# Patient Record
Sex: Female | Born: 1950 | Race: White | Hispanic: No | Marital: Married
Health system: Midwestern US, Community
[De-identification: ages and names within clinical notes are randomized; demographics above are authoritative.]

## PROBLEM LIST (undated history)

## (undated) DIAGNOSIS — M1990 Unspecified osteoarthritis, unspecified site: Secondary | ICD-10-CM

## (undated) DIAGNOSIS — I2510 Atherosclerotic heart disease of native coronary artery without angina pectoris: Secondary | ICD-10-CM

## (undated) DIAGNOSIS — Z8619 Personal history of other infectious and parasitic diseases: Secondary | ICD-10-CM

## (undated) DIAGNOSIS — E785 Hyperlipidemia, unspecified: Secondary | ICD-10-CM

## (undated) DIAGNOSIS — T7840XA Allergy, unspecified, initial encounter: Secondary | ICD-10-CM

## (undated) DIAGNOSIS — H905 Unspecified sensorineural hearing loss: Secondary | ICD-10-CM

## (undated) DIAGNOSIS — J45909 Unspecified asthma, uncomplicated: Secondary | ICD-10-CM

## (undated) DIAGNOSIS — R7303 Prediabetes: Secondary | ICD-10-CM

## (undated) DIAGNOSIS — R42 Dizziness and giddiness: Secondary | ICD-10-CM

## (undated) DIAGNOSIS — F32A Depression, unspecified: Secondary | ICD-10-CM

## (undated) DIAGNOSIS — K5010 Crohn's disease of large intestine without complications: Secondary | ICD-10-CM

## (undated) DIAGNOSIS — E559 Vitamin D deficiency, unspecified: Secondary | ICD-10-CM

## (undated) DIAGNOSIS — K219 Gastro-esophageal reflux disease without esophagitis: Secondary | ICD-10-CM

## (undated) DIAGNOSIS — H269 Unspecified cataract: Secondary | ICD-10-CM

## (undated) DIAGNOSIS — D490 Neoplasm of unspecified behavior of digestive system: Secondary | ICD-10-CM

## (undated) DIAGNOSIS — M8580 Other specified disorders of bone density and structure, unspecified site: Secondary | ICD-10-CM

## (undated) DIAGNOSIS — K760 Fatty (change of) liver, not elsewhere classified: Secondary | ICD-10-CM

## (undated) DIAGNOSIS — N816 Rectocele: Secondary | ICD-10-CM

## (undated) DIAGNOSIS — J309 Allergic rhinitis, unspecified: Secondary | ICD-10-CM

## (undated) DIAGNOSIS — Z5189 Encounter for other specified aftercare: Secondary | ICD-10-CM

## (undated) DIAGNOSIS — F419 Anxiety disorder, unspecified: Secondary | ICD-10-CM

## (undated) DIAGNOSIS — F329 Major depressive disorder, single episode, unspecified: Secondary | ICD-10-CM

## (undated) DIAGNOSIS — Z1231 Encounter for screening mammogram for malignant neoplasm of breast: Secondary | ICD-10-CM

## (undated) DIAGNOSIS — R10814 Left lower quadrant abdominal tenderness: Secondary | ICD-10-CM

## (undated) DIAGNOSIS — D270 Benign neoplasm of right ovary: Secondary | ICD-10-CM

## (undated) DIAGNOSIS — R928 Other abnormal and inconclusive findings on diagnostic imaging of breast: Secondary | ICD-10-CM

## (undated) DIAGNOSIS — I7090 Unspecified atherosclerosis: Secondary | ICD-10-CM

## (undated) HISTORY — DX: Encounter for other specified aftercare: Z51.89

## (undated) HISTORY — DX: Depression, unspecified: F32.A

## (undated) HISTORY — PX: OOPHORECTOMY: SHX86

## (undated) HISTORY — DX: Dizziness and giddiness: R42

## (undated) HISTORY — DX: Fatty (change of) liver, not elsewhere classified: K76.0

## (undated) HISTORY — DX: Anxiety disorder, unspecified: F41.9

## (undated) HISTORY — DX: Hyperlipidemia, unspecified: E78.5

## (undated) HISTORY — DX: Unspecified cataract: H26.9

## (undated) HISTORY — DX: Allergy, unspecified, initial encounter: T78.40XA

## (undated) HISTORY — DX: Major depressive disorder, single episode, unspecified: F32.9

## (undated) HISTORY — DX: Unspecified osteoarthritis, unspecified site: M19.90

## (undated) HISTORY — DX: Neoplasm of unspecified behavior of digestive system: D49.0

## (undated) HISTORY — DX: Unspecified sensorineural hearing loss: H90.5

## (undated) HISTORY — DX: Gastro-esophageal reflux disease without esophagitis: K21.9

## (undated) HISTORY — PX: TONSILLECTOMY AND ADENOIDECTOMY: SHX28

## (undated) HISTORY — DX: Atherosclerotic heart disease of native coronary artery without angina pectoris: I25.10

## (undated) HISTORY — DX: Other specified disorders of bone density and structure, unspecified site: M85.80

## (undated) HISTORY — PX: UPPER GASTROINTESTINAL ENDOSCOPY: SHX188

## (undated) HISTORY — DX: Allergic rhinitis, unspecified: J30.9

## (undated) HISTORY — DX: Rectocele: N81.6

## (undated) HISTORY — DX: Prediabetes: R73.03

## (undated) HISTORY — DX: Personal history of other infectious and parasitic diseases: Z86.19

## (undated) HISTORY — DX: Unspecified asthma, uncomplicated: J45.909

## (undated) HISTORY — DX: Crohn's disease of large intestine without complications: K50.10

## (undated) HISTORY — PX: ABDOMINAL HYSTERECTOMY: SHX81

## (undated) HISTORY — DX: Vitamin D deficiency, unspecified: E55.9

---

## 2012-05-30 LAB — HM COLONOSCOPY

## 2012-09-19 ENCOUNTER — Encounter

## 2014-03-17 ENCOUNTER — Encounter

## 2014-06-22 ENCOUNTER — Encounter

## 2014-06-26 ENCOUNTER — Ambulatory Visit: Payer: BLUE CROSS/BLUE SHIELD | Primary: Family Medicine

## 2014-06-26 ENCOUNTER — Inpatient Hospital Stay: Admit: 2014-06-26 | Payer: BLUE CROSS/BLUE SHIELD | Attending: Family Medicine | Primary: Family Medicine

## 2014-06-26 DIAGNOSIS — R10814 Left lower quadrant abdominal tenderness: Secondary | ICD-10-CM

## 2014-07-30 ENCOUNTER — Ambulatory Visit
Admit: 2014-07-30 | Discharge: 2014-07-30 | Payer: BLUE CROSS/BLUE SHIELD | Attending: Obstetrics & Gynecology | Primary: Family Medicine

## 2014-07-30 DIAGNOSIS — R1900 Intra-abdominal and pelvic swelling, mass and lump, unspecified site: Secondary | ICD-10-CM

## 2014-07-30 NOTE — Progress Notes (Signed)
HISTORY OF PRESENT ILLNESS  Lindsay Robbins is a 64 y.o. female.  HPI   Patient presents today for pelvic pain and right adnexal mass seen on [place]. US revealed a 9x6cm mostly cystic mass just right of midline. Patient has a hx of TAH/LSO. Has had 1 month of pelvic pain and pressure with urinary frequency. Referred from her PCP. Also complains of known rectocele. Has difficulty with bowel movements and feels a sensation of "bulging and fullness".     Chief Complaint   Patient presents with   ??? Follow-up       Allergies   Allergen Reactions   ??? Codeine Itching     Current Outpatient Prescriptions   Medication Sig Dispense Refill   ??? rosuvastatin (CRESTOR) 10 mg tablet Take 10 mg by mouth nightly.     ??? estradiol (ESTRACE) 1 mg tablet Take 1 mg by mouth daily.     ??? vilazodone (VIIBRYD) 40 mg tab tablet Take  by mouth daily.     ??? LORazepam (ATIVAN) 0.5 mg tablet Take  by mouth.     ??? celecoxib (CELEBREX) 200 mg capsule Take  by mouth two (2) times a day.     ??? cyanocobalamin (VITAMIN B12) 1,000 mcg/mL injection 1,000 mcg by IntraMUSCular route once.     ??? aspirin delayed-release 81 mg tablet Take  by mouth daily.     ??? multivitamin with iron tablet Take 1 Tab by mouth daily.       Past Medical History   Diagnosis Date   ??? Rectocele 07/30/2014   ??? Hypercholesterolemia      Past Surgical History   Procedure Laterality Date   ??? Hx hysterectomy       TAH/LSO   ??? Hx cesarean section       History     Social History   ??? Marital Status: MARRIED     Spouse Name: N/A     Number of Children: N/A   ??? Years of Education: N/A     Occupational History   ??? Not on file.     Social History Main Topics   ??? Smoking status: Never Smoker    ??? Smokeless tobacco: Not on file   ??? Alcohol Use: No   ??? Drug Use: No   ??? Sexual Activity:     Partners: Male     Other Topics Concern   ??? Not on file     Social History Narrative     Family History   Problem Relation Age of Onset   ??? Breast Cancer Neg Hx    ??? Colon Cancer Neg Hx    ??? Ovarian Cancer Neg Hx       BP 120/80 mmHg   Ht 5\' 4"  (1.626 m)   Wt 197 lb (89.359 kg)   BMI 33.80 kg/m2                 Review of Systems   Constitutional: Negative for fever, chills, weight loss and malaise/fatigue.   Respiratory: Negative for cough and shortness of breath.    Cardiovascular: Negative for chest pain and orthopnea.   Gastrointestinal: Positive for abdominal pain. Negative for heartburn, nausea, vomiting, diarrhea, constipation, blood in stool and melena.   Genitourinary: Positive for frequency. Negative for dysuria, urgency, hematuria and flank pain.   Neurological: Negative for headaches.   Psychiatric/Behavioral: Negative for depression. The patient is not nervous/anxious and does not have insomnia.        Physical  Exam   Constitutional: She is oriented to person, place, and time. She appears well-developed and well-nourished. No distress.   HENT:   Head: Normocephalic.   Eyes: Conjunctivae are normal. Pupils are equal, round, and reactive to light.   Neck: Normal range of motion. Neck supple.   Cardiovascular: Normal rate.    Pulmonary/Chest: Effort normal. No respiratory distress.   Abdominal: Soft. She exhibits no distension and no mass. There is no tenderness. There is no rebound and no guarding.   Genitourinary: Vagina normal. Rectal exam shows no external hemorrhoid. No labial fusion. There is no rash, tenderness, lesion or injury on the right labia. There is no rash, tenderness, lesion or injury on the left labia. Right adnexum displays mass, tenderness and fullness. Left adnexum displays no mass, no tenderness and no fullness. No erythema or bleeding in the vagina. No vaginal discharge found.   Moderate stage 1 rectocele noted.    Musculoskeletal: Normal range of motion.   Neurological: She is alert and oriented to person, place, and time.   Skin: Skin is warm and dry.   Psychiatric: She has a normal mood and affect. Her behavior is normal. Judgment and thought content normal.   Vitals reviewed.       ASSESSMENT and PLAN    ICD-10-CM ICD-9-CM    1. Pelvic mass in female R19.00 789.30 CANCER ANTIGEN 125   2. Rectocele N81.6 618.04      Follow-up Disposition:  Return if symptoms worsen or fail to improve, for preop.  Reviewed US findings and discussed mgmt options for pelvic mass and rectocele. Reassured that mass appears benign on [place]. Will obtain CA125. If CA125 normal then will proceed with laparoscopic oophorectomy. She would also like to proceed with rectocele repair. All questions answered.     The risks of surgery were discussed in detail, including anesthesia, hemorrhage, blood clots, infection, damage to other organs such as bowel, bladder, ureters. Also the possible need for catheter use at home after surgery.  Time away from work and physical restrictions discussed.  Pt understands that complications aren't anticipated but that if they should occur then corrective procedures would be performed as indicated including, but not limited to:  transfusion, antibiotics and additional surgical procedures including modification/extension of incision (possible vertical incision), colostomy, reimplantation of ureters, need for prolonged catheter drainage should urologic injury occur & other procedures as indicated.  Unanticipated reactions to anesthesia as well as the small possibility of death were all discussed.  All of the patient's questions were answered.  She was encouraged to call if she has additional questions/concerns prior to surgery.  Patient understands and states she wants to proceed w/ surgery.   More then 50% of this 45 minute visit was spent counseling the patient about mgmt of adnexal mass and rectocele.

## 2014-07-30 NOTE — Progress Notes (Signed)
Name: Lindsay Robbins     DOB: [birth year]  Insurance:    Address:   Home:   Work:    Physicians Information    Recommended Surgery: laparoscopic RSO/rectocele repair  Place: [facility]  When: coordinate with patient  Diagnosis: right adnexal mass, rectocele  Time Needed: 1.5 hrs  Surgeon: Tonye Pearson  Assistant Needed: Tamala Julian  Special Request:    Surgery Department Information    Date Scheduled: _____________________ Hospital Pre-Op: ______________________        Time Scheduled : ____________________ Surgery Entered: _____________________    Facility: ____________________________ Patient Notified: ______________________    HCW Pre-Op: _______________________ Orders Completed: ___________________    Signed by:

## 2014-07-31 LAB — CANCER ANTIGEN 125: Cancer Ag (CA) 125: 12.1 U/mL (ref 0.0–34.0)

## 2014-08-28 ENCOUNTER — Ambulatory Visit
Admit: 2014-08-28 | Discharge: 2014-08-28 | Payer: BLUE CROSS/BLUE SHIELD | Attending: Obstetrics & Gynecology | Primary: Family Medicine

## 2014-08-28 ENCOUNTER — Inpatient Hospital Stay: Admit: 2014-08-28 | Payer: BLUE CROSS/BLUE SHIELD | Primary: Family Medicine

## 2014-08-28 DIAGNOSIS — Z01812 Encounter for preprocedural laboratory examination: Secondary | ICD-10-CM

## 2014-08-28 DIAGNOSIS — Z01818 Encounter for other preprocedural examination: Secondary | ICD-10-CM

## 2014-08-28 LAB — CBC W/O DIFF
HCT: 40.1 % (ref 35.8–46.3)
HGB: 12.9 g/dL (ref 11.7–15.4)
MCH: 29.5 PG (ref 26.1–32.9)
MCHC: 32.2 g/dL (ref 31.4–35.0)
MCV: 91.6 FL (ref 79.6–97.8)
MPV: 12 FL (ref 10.8–14.1)
PLATELET: 229 10*3/uL (ref 150–450)
RBC: 4.38 M/uL (ref 4.05–5.25)
RDW: 12.5 % (ref 11.9–14.6)
WBC: 8.6 10*3/uL (ref 4.3–11.1)

## 2014-08-28 LAB — URINALYSIS W/ RFLX MICROSCOPIC
Bilirubin: NEGATIVE
Blood: NEGATIVE
Glucose: NEGATIVE mg/dL
Ketone: NEGATIVE mg/dL
Leukocyte Esterase: NEGATIVE
Nitrites: NEGATIVE
Protein: NEGATIVE mg/dL
Specific gravity: 1.015 (ref 1.001–1.023)
Urobilinogen: 0.2 EU/dL (ref 0.2–1.0)
pH (UA): 7 (ref 5.0–9.0)

## 2014-08-28 NOTE — Other (Signed)
CBC, U/A; results within [facility] protocols.

## 2014-08-28 NOTE — Other (Signed)
Patient verified name, DOB, and surgery as listed in [facility].    Type 2 surgery, walk in assessment complete.    Labs per surgeon: CBC, U/A; results pending.  Labs per anesthesia protocol: included in surgeons orders.   EKG: not needed per [facility] protocols.     Hibiclens and instructions given per hospital policy.    Patient provided with handouts including Guide to Surgery, Pain Management, Hand Hygiene, Blood Transfusion Education, and Corporate treasurer.    Patient answered medical/surgical history questions at their best of ability. All prior to admission medications documented in [facility].    Patient instructed to hold all vitamins 7 days prior to surgery and NSAIDS 5 days prior to surgery, patient verbalized understanding.    Patient instructed to continue previous medications as prescribed prior to surgery and to take the following medications the day of surgery according to anesthesia guidelines with a small sip of water: proventil inhaler if needed, estradiol, vibryd. Instructed to bring inhaler dos.     Patient taught back and verbalized understanding.

## 2014-08-28 NOTE — Progress Notes (Signed)
HISTORY OF PRESENT ILLNESS  Lindsay Robbins is a 64 y.o. female.  HPI   Lindsay Robbins presents today for pre-operative evaluation.       Female Gyn History: scheduled for laparoscopic RSO and rectocele repair.     Allergies:   Allergies   Allergen Reactions   ??? Codeine Itching       Medication History:  Current Outpatient Prescriptions   Medication Sig Dispense Refill   ??? rosuvastatin (CRESTOR) 10 mg tablet Take 10 mg by mouth nightly.     ??? estradiol (ESTRACE) 1 mg tablet Take 1 mg by mouth daily.     ??? vilazodone (VIIBRYD) 40 mg tab tablet Take  by mouth daily.     ??? LORazepam (ATIVAN) 0.5 mg tablet Take  by mouth.     ??? celecoxib (CELEBREX) 200 mg capsule Take  by mouth two (2) times a day.     ??? cyanocobalamin (VITAMIN B12) 1,000 mcg/mL injection 1,000 mcg by IntraMUSCular route once.     ??? aspirin delayed-release 81 mg tablet Take  by mouth daily.     ??? multivitamin with iron tablet Take 1 Tab by mouth daily.         Past Medical History:  Past Medical History   Diagnosis Date   ??? Rectocele 07/30/2014   ??? Hypercholesterolemia        Past Surgical History:  Past Surgical History   Procedure Laterality Date   ??? Hx hysterectomy       TAH/LSO   ??? Hx cesarean section         Social History:  History     Social History   ??? Marital Status: MARRIED     Spouse Name: N/A     Number of Children: N/A   ??? Years of Education: N/A     Occupational History   ??? Not on file.     Social History Main Topics   ??? Smoking status: Never Smoker    ??? Smokeless tobacco: Not on file   ??? Alcohol Use: No   ??? Drug Use: No   ??? Sexual Activity:     Partners: Male     Other Topics Concern   ??? Not on file     Social History Narrative       Family History:  Family History   Problem Relation Age of Onset   ??? Breast Cancer Neg Hx    ??? Colon Cancer Neg Hx    ??? Ovarian Cancer Neg Hx        BP 120/84 mmHg   Ht 5\' 4"  (1.626 m)   Wt 198 lb (89.812 kg)   BMI 33.97 kg/m2       Review of Systems   Constitutional: Negative for fever and chills.    HENT: Negative for sore throat.    Respiratory: Negative for cough and shortness of breath.    Cardiovascular: Negative for chest pain and orthopnea.   Gastrointestinal: Negative for nausea, vomiting, abdominal pain, diarrhea, constipation and blood in stool.   Genitourinary: Negative for dysuria, urgency, frequency and hematuria.   Neurological: Negative for headaches.   Endo/Heme/Allergies: Does not bruise/bleed easily.       Physical Exam   Constitutional: She is oriented to person, place, and time. She appears well-developed and well-nourished.   HENT:   Head: Normocephalic and atraumatic.   Eyes: EOM are normal. Pupils are equal, round, and reactive to light.   Neck: Normal range of motion. Neck supple.  Cardiovascular: Normal rate, regular rhythm and normal heart sounds.    Pulmonary/Chest: Effort normal and breath sounds normal. No respiratory distress.   Musculoskeletal: Normal range of motion.   Neurological: She is alert and oriented to person, place, and time.   Skin: Skin is warm and dry.   Psychiatric: She has a normal mood and affect. Her behavior is normal. Thought content normal.       ASSESSMENT and PLAN    ICD-10-CM ICD-9-CM    1. Preop examination Z01.818 V72.84    2. Adnexal mass N94.9 625.8    3. Rectocele N81.6 618.04      Follow-up Disposition:  Return if symptoms worsen or fail to improve, for postop.  The risks of surgery were discussed in detail, including anesthesia, hemorrhage, blood clots, infection, damage to other organs such as bowel, bladder, ureters. Also the possible need for catheter use at home after surgery.  Time away from work and physical restrictions discussed.  Pt understands that complications aren't anticipated but that if they should occur then corrective procedures would be performed as indicated including, but not limited to:  transfusion, antibiotics and additional surgical procedures including modification/extension of incision (possible  vertical incision), colostomy, reimplantation of ureters, need for prolonged catheter drainage should urologic injury occur & other procedures as indicated.  Unanticipated reactions to anesthesia as well as the small possibility of death were all discussed.  All of the patient's questions were answered.  She was encouraged to call if she has additional questions/concerns prior to surgery.  Patient understands and states she wants to proceed w/ surgery.

## 2014-09-03 NOTE — H&P (Signed)
HISTORY OF PRESENT ILLNESS  Lindsay Robbins is a 64 y.o. female.  HPI ??  Lindsay Robbins presents today for pre-operative evaluation.       Female Gyn History: scheduled for laparoscopic RSO and rectocele repair.     Allergies: ??  Allergies??   Allergen?? Reactions??   ????? Codeine?? Itching??       Medication History:  Current Outpatient Prescriptions??   Medication?? Sig?? Dispense?? Refill??   ????? rosuvastatin (CRESTOR) 10 mg tablet?? Take 10 mg by mouth nightly.?? ?? ??   ????? estradiol (ESTRACE) 1 mg tablet?? Take 1 mg by mouth daily.?? ?? ??   ????? vilazodone (VIIBRYD) 40 mg tab tablet?? Take?? by mouth daily.?? ?? ??   ????? LORazepam (ATIVAN) 0.5 mg tablet?? Take?? by mouth.?? ?? ??   ????? celecoxib (CELEBREX) 200 mg capsule?? Take?? by mouth two (2) times a day.?? ?? ??   ????? cyanocobalamin (VITAMIN B12) 1,000 mcg/mL injection?? 1,000 mcg by IntraMUSCular route once.?? ?? ??   ????? aspirin delayed-release 81 mg tablet?? Take?? by mouth daily.?? ?? ??   ????? multivitamin with iron tablet?? Take 1 Tab by mouth daily.?? ?? ??       Past Medical History:  Past Medical History??   Diagnosis?? Date??   ????? Rectocele?? 07/30/2014??   ????? Hypercholesterolemia?? ??       Past Surgical History:  Past Surgical History??   Procedure?? Laterality?? Date??   ????? Hx hysterectomy?? ?? ??   ?? ?? TAH/LSO??   ????? Hx cesarean section?? ?? ??       Social History:  History??   ????  Social History??   ????? Marital Status:?? MARRIED??   ?? ?? Spouse Name:?? N/A??   ?? ?? Number of Children:?? N/A??   ????? Years of Education:?? N/A??   ????  Occupational History??   ????? Not on file.??   ????  Social History Main Topics??   ????? Smoking status:?? Never Smoker ??   ????? Smokeless tobacco:?? Not on file??   ????? Alcohol Use:?? No??   ????? Drug Use:?? No??   ????? Sexual Activity:??   ?? ?? Partners:?? Female??   ????  Other Topics?? Concern??   ????? Not on file??   ????  Social History Narrative??       Family History:  Family History??   Problem?? Relation?? Age of Onset??   ????? Breast Cancer?? Neg Hx?? ??   ????? Colon Cancer?? Neg Hx?? ??   ????? Ovarian Cancer?? Neg Hx?? ??        BP 120/84 mmHg   Ht 5\' 4"  (1.626 m)   Wt 198 lb (89.812 kg)   BMI 33.97 kg/m2       Review of Systems   Constitutional: Negative for fever and chills.   HENT: Negative for sore throat.??   Respiratory: Negative for cough and shortness of breath.??   Cardiovascular: Negative for chest pain and orthopnea.   Gastrointestinal: Negative for nausea, vomiting, abdominal pain, diarrhea, constipation and blood in stool.   Genitourinary: Negative for dysuria, urgency, frequency and hematuria.   Neurological: Negative for headaches.   Endo/Heme/Allergies: Does not bruise/bleed easily.         Physical Exam   Constitutional: She is oriented to person, place, and time. She appears well-developed and well-nourished.   HENT: ??  Head: Normocephalic and atraumatic.   Eyes: EOM are normal. Pupils are equal, round, and reactive to light.   Neck: Normal range of motion. Neck supple.   Cardiovascular: Normal  rate, regular rhythm and normal heart sounds.??   Pulmonary/Chest: Effort normal and breath sounds normal. No respiratory distress.   Musculoskeletal: Normal range of motion.   Neurological: She is alert and oriented to person, place, and time.   Skin: Skin is warm and dry.   Psychiatric: She has a normal mood and affect. Her behavior is normal. Thought content normal.       ASSESSMENT and PLAN  ?? ?? ICD-10-CM?? ICD-9-CM?? ??   1.?? Preop examination?? Z01.818?? V72.84?? ??   2.?? Adnexal mass?? N94.9?? 625.8?? ??   3.?? Rectocele?? N81.6?? 618.04?? ??     Follow-up Disposition:  Return if symptoms worsen or fail to improve, for postop.  The risks of surgery were discussed in detail, including anesthesia, hemorrhage, blood clots, infection, damage to other organs such as bowel, bladder, ureters. Also the possible need for catheter use at home after surgery.?? Time away from work and physical restrictions discussed.  Pt understands that complications aren't anticipated but that if they should occur then corrective procedures would be performed as indicated  including, but not limited to:?? transfusion, antibiotics and additional surgical procedures including modification/extension of incision (possible vertical incision), colostomy, reimplantation of ureters, need for prolonged catheter drainage should urologic injury occur & other procedures as indicated.?? Unanticipated reactions to anesthesia as well as the small possibility of death were all discussed.?? All of the patient's questions were answered.?? She was encouraged to call if she has additional questions/concerns prior to surgery.?? Patient understands and states she wants to proceed w/ surgery.

## 2014-09-04 ENCOUNTER — Inpatient Hospital Stay: Payer: BLUE CROSS/BLUE SHIELD

## 2014-09-04 MED ORDER — PHENYLEPHRINE 10 MG/ML INJECTION
10 mg/mL | INTRAMUSCULAR | Status: DC | PRN
Start: 2014-09-04 — End: 2014-09-04
  Administered 2014-09-04: 13:00:00

## 2014-09-04 MED ORDER — ROCURONIUM 10 MG/ML IV
10 mg/mL | INTRAVENOUS | Status: DC | PRN
Start: 2014-09-04 — End: 2014-09-04
  Administered 2014-09-04: 13:00:00 via INTRAVENOUS

## 2014-09-04 MED ORDER — HYDROMORPHONE (PF) 2 MG/ML IJ SOLN
2 mg/mL | INTRAMUSCULAR | Status: DC | PRN
Start: 2014-09-04 — End: 2014-09-04
  Administered 2014-09-04 (×2): via INTRAVENOUS

## 2014-09-04 MED ORDER — WHITE PETROLATUM-MINERAL OIL 83 %-15 % EYE OINTMENT
83-15 % | OPHTHALMIC | Status: AC
Start: 2014-09-04 — End: ?

## 2014-09-04 MED ORDER — DEXAMETHASONE SODIUM PHOSPHATE 4 MG/ML IJ SOLN
4 mg/mL | INTRAMUSCULAR | Status: DC | PRN
Start: 2014-09-04 — End: 2014-09-04
  Administered 2014-09-04: 13:00:00 via INTRAVENOUS

## 2014-09-04 MED ORDER — FENTANYL CITRATE (PF) 50 MCG/ML IJ SOLN
50 mcg/mL | INTRAMUSCULAR | Status: AC
Start: 2014-09-04 — End: ?

## 2014-09-04 MED ORDER — SODIUM CHLORIDE 0.9 % IJ SYRG
INTRAMUSCULAR | Status: DC | PRN
Start: 2014-09-04 — End: 2014-09-04

## 2014-09-04 MED ORDER — GLYCOPYRROLATE 0.2 MG/ML IJ SOLN
0.2 mg/mL | INTRAMUSCULAR | Status: AC
Start: 2014-09-04 — End: ?

## 2014-09-04 MED ORDER — NEOSTIGMINE METHYLSULFATE 1 MG/ML INJECTION
1 mg/mL | INTRAMUSCULAR | Status: AC
Start: 2014-09-04 — End: ?

## 2014-09-04 MED ORDER — KETOROLAC TROMETHAMINE 30 MG/ML INJECTION
30 mg/mL (1 mL) | INTRAMUSCULAR | Status: DC | PRN
Start: 2014-09-04 — End: 2014-09-04
  Administered 2014-09-04: 14:00:00 via INTRAVENOUS

## 2014-09-04 MED ORDER — GLYCOPYRROLATE 0.2 MG/ML IJ SOLN
0.2 mg/mL | INTRAMUSCULAR | Status: DC | PRN
Start: 2014-09-04 — End: 2014-09-04
  Administered 2014-09-04: 14:00:00 via INTRAVENOUS

## 2014-09-04 MED ORDER — IBUPROFEN 800 MG TAB
800 mg | ORAL_TABLET | Freq: Four times a day (QID) | ORAL | Status: AC | PRN
Start: 2014-09-04 — End: ?

## 2014-09-04 MED ORDER — MIDAZOLAM 1 MG/ML IJ SOLN
1 mg/mL | Freq: Once | INTRAMUSCULAR | Status: AC
Start: 2014-09-04 — End: 2014-09-04
  Administered 2014-09-04: 12:00:00 via INTRAVENOUS

## 2014-09-04 MED ORDER — PROPOFOL 10 MG/ML IV EMUL
10 mg/mL | INTRAVENOUS | Status: DC | PRN
Start: 2014-09-04 — End: 2014-09-04
  Administered 2014-09-04: 13:00:00 via INTRAVENOUS

## 2014-09-04 MED ORDER — LACTATED RINGERS IV
INTRAVENOUS | Status: DC
Start: 2014-09-04 — End: 2014-09-04

## 2014-09-04 MED ORDER — DOCUSATE SODIUM 100 MG CAP
100 mg | ORAL_CAPSULE | Freq: Two times a day (BID) | ORAL | Status: AC
Start: 2014-09-04 — End: 2014-12-03

## 2014-09-04 MED ORDER — LACTATED RINGERS IV
INTRAVENOUS | Status: DC
Start: 2014-09-04 — End: 2014-09-04
  Administered 2014-09-04 (×3): via INTRAVENOUS

## 2014-09-04 MED ORDER — EPHEDRINE SULFATE 50 MG/ML IJ SOLN
50 mg/mL | INTRAMUSCULAR | Status: AC
Start: 2014-09-04 — End: ?

## 2014-09-04 MED ORDER — ONDANSETRON (PF) 4 MG/2 ML INJECTION
4 mg/2 mL | INTRAMUSCULAR | Status: DC | PRN
Start: 2014-09-04 — End: 2014-09-04
  Administered 2014-09-04: 13:00:00 via INTRAVENOUS

## 2014-09-04 MED ORDER — OXYCODONE 5 MG TAB
5 mg | ORAL | Status: DC | PRN
Start: 2014-09-04 — End: 2014-09-04

## 2014-09-04 MED ORDER — PROMETHAZINE 25 MG/ML INJECTION
25 mg/mL | INTRAMUSCULAR | Status: DC | PRN
Start: 2014-09-04 — End: 2014-09-04

## 2014-09-04 MED ORDER — KETOROLAC TROMETHAMINE 30 MG/ML INJECTION
30 mg/mL (1 mL) | INTRAMUSCULAR | Status: AC
Start: 2014-09-04 — End: ?

## 2014-09-04 MED ORDER — EPHEDRINE SULFATE 50 MG/ML IJ SOLN
50 mg/mL | INTRAMUSCULAR | Status: DC | PRN
Start: 2014-09-04 — End: 2014-09-04
  Administered 2014-09-04: 13:00:00 via INTRAVENOUS

## 2014-09-04 MED ORDER — DEXAMETHASONE SODIUM PHOSPHATE 10 MG/ML IJ SOLN
10 mg/mL | INTRAMUSCULAR | Status: AC
Start: 2014-09-04 — End: ?

## 2014-09-04 MED ORDER — LIDOCAINE HCL 1 % (10 MG/ML) IJ SOLN
10 mg/mL (1 %) | INTRAMUSCULAR | Status: DC | PRN
Start: 2014-09-04 — End: 2014-09-04
  Administered 2014-09-04: 12:00:00 via SUBCUTANEOUS

## 2014-09-04 MED ORDER — OXYCODONE-ACETAMINOPHEN 7.5 MG-325 MG TAB
ORAL_TABLET | ORAL | Status: AC | PRN
Start: 2014-09-04 — End: ?

## 2014-09-04 MED ORDER — LIDOCAINE (PF) 20 MG/ML (2 %) IV SYRINGE
100 mg/5 mL (2 %) | INTRAVENOUS | Status: AC
Start: 2014-09-04 — End: ?

## 2014-09-04 MED ORDER — SODIUM CHLORIDE 0.9 % IJ SYRG
Freq: Three times a day (TID) | INTRAMUSCULAR | Status: DC
Start: 2014-09-04 — End: 2014-09-04

## 2014-09-04 MED ORDER — FENTANYL CITRATE (PF) 50 MCG/ML IJ SOLN
50 mcg/mL | INTRAMUSCULAR | Status: DC | PRN
Start: 2014-09-04 — End: 2014-09-04
  Administered 2014-09-04 (×2): via INTRAVENOUS

## 2014-09-04 MED ORDER — NEOSTIGMINE METHYLSULFATE 1 MG/ML INJECTION
1 mg/mL | INTRAMUSCULAR | Status: DC | PRN
Start: 2014-09-04 — End: 2014-09-04
  Administered 2014-09-04: 14:00:00 via INTRAVENOUS

## 2014-09-04 MED ORDER — PHENYLEPHRINE 10 MG/ML INJECTION
10 mg/mL | INTRAMUSCULAR | Status: AC
Start: 2014-09-04 — End: ?

## 2014-09-04 MED ORDER — WHITE PETROLATUM-MINERAL OIL 83 %-15 % EYE OINTMENT
83-15 % | OPHTHALMIC | Status: DC | PRN
Start: 2014-09-04 — End: 2014-09-04
  Administered 2014-09-04: 13:00:00 via OPHTHALMIC

## 2014-09-04 MED ORDER — ACETAMINOPHEN 500 MG TAB
500 mg | Freq: Four times a day (QID) | ORAL | Status: DC | PRN
Start: 2014-09-04 — End: 2014-09-04

## 2014-09-04 MED ORDER — SODIUM CHLORIDE 0.9 % IV
INTRAVENOUS | Status: DC
Start: 2014-09-04 — End: 2014-09-04

## 2014-09-04 MED ORDER — ROCURONIUM 10 MG/ML IV
10 mg/mL | INTRAVENOUS | Status: AC
Start: 2014-09-04 — End: ?

## 2014-09-04 MED ORDER — PROPOFOL 10 MG/ML IV EMUL
10 mg/mL | INTRAVENOUS | Status: AC
Start: 2014-09-04 — End: ?

## 2014-09-04 MED ORDER — LIDOCAINE (PF) 20 MG/ML (2 %) IJ SOLN
20 mg/mL (2 %) | INTRAMUSCULAR | Status: DC | PRN
Start: 2014-09-04 — End: 2014-09-04
  Administered 2014-09-04: 13:00:00 via INTRAVENOUS

## 2014-09-04 MED ORDER — FAMOTIDINE 20 MG TAB
20 mg | Freq: Once | ORAL | Status: AC
Start: 2014-09-04 — End: 2014-09-04
  Administered 2014-09-04: 11:00:00 via ORAL

## 2014-09-04 MED ORDER — ONDANSETRON (PF) 4 MG/2 ML INJECTION
4 mg/2 mL | INTRAMUSCULAR | Status: AC
Start: 2014-09-04 — End: ?

## 2014-09-04 MED ORDER — METHYLENE BLUE (ANTIDOTE) 1 % IJ SOLN
1 % (0 mg/mL) | INTRAVENOUS | Status: AC
Start: 2014-09-04 — End: ?

## 2014-09-04 MED ORDER — SCOPOLAMINE (1.3-1.5) MG 72 HR TRANSDERM PATCH
1 mg over 3 days | TRANSDERMAL | Status: DC
Start: 2014-09-04 — End: 2014-09-04

## 2014-09-04 MED FILL — FENTANYL CITRATE (PF) 50 MCG/ML IJ SOLN: 50 mcg/mL | INTRAMUSCULAR | Qty: 2

## 2014-09-04 MED FILL — FAMOTIDINE 20 MG TAB: 20 mg | ORAL | Qty: 1

## 2014-09-04 MED FILL — HYDROMORPHONE (PF) 2 MG/ML IJ SOLN: 2 mg/mL | INTRAMUSCULAR | Qty: 1

## 2014-09-04 MED FILL — EPHEDRINE SULFATE 50 MG/ML IJ SOLN: 50 mg/mL | INTRAMUSCULAR | Qty: 1

## 2014-09-04 MED FILL — GLYCOPYRROLATE 0.2 MG/ML IJ SOLN: 0.2 mg/mL | INTRAMUSCULAR | Qty: 2

## 2014-09-04 MED FILL — PROPOFOL 10 MG/ML IV EMUL: 10 mg/mL | INTRAVENOUS | Qty: 20

## 2014-09-04 MED FILL — LIDOCAINE (PF) 20 MG/ML (2 %) IV SYRINGE: 100 mg/5 mL (2 %) | INTRAVENOUS | Qty: 5

## 2014-09-04 MED FILL — TRANSDERM-SCOP 1 MG OVER 3 DAYS TRANSDERMAL PATCH: 1 mg over 3 days | TRANSDERMAL | Qty: 1

## 2014-09-04 MED FILL — ARTIFICIAL TEARS (PETROLATUM/MINERAL OIL) 83 %-15 % EYE OINTMENT: 83-15 % | OPHTHALMIC | Qty: 3.5

## 2014-09-04 MED FILL — KETOROLAC TROMETHAMINE 30 MG/ML INJECTION: 30 mg/mL (1 mL) | INTRAMUSCULAR | Qty: 1

## 2014-09-04 MED FILL — ROCURONIUM 10 MG/ML IV: 10 mg/mL | INTRAVENOUS | Qty: 5

## 2014-09-04 MED FILL — NEOSTIGMINE METHYLSULFATE 1 MG/ML INJECTION: 1 mg/mL | INTRAMUSCULAR | Qty: 10

## 2014-09-04 MED FILL — DEXAMETHASONE SODIUM PHOSPHATE 10 MG/ML IJ SOLN: 10 mg/mL | INTRAMUSCULAR | Qty: 10

## 2014-09-04 MED FILL — METHYLENE BLUE (ANTIDOTE) 1 % IJ SOLN: 1 % (0 mg/mL) | INTRAVENOUS | Qty: 1

## 2014-09-04 MED FILL — ONDANSETRON (PF) 4 MG/2 ML INJECTION: 4 mg/2 mL | INTRAMUSCULAR | Qty: 2

## 2014-09-04 MED FILL — MIDAZOLAM 1 MG/ML IJ SOLN: 1 mg/mL | INTRAMUSCULAR | Qty: 2

## 2014-09-04 MED FILL — PHENYLEPHRINE 10 MG/ML INJECTION: 10 mg/mL | INTRAMUSCULAR | Qty: 1

## 2014-09-04 NOTE — Op Note (Signed)
[facility]                           [address]                            [place], [place]                                [phone]                                OPERATIVE REPORT  ________________________________________________________________________  NAME:  Lindsay Robbins, Lindsay Robbins                                 MR:  [id number]  LOC:  [id number]            SEX:  F               ACCT:  [id number]  DOB:  03-Oct-1950            AGE:  64              PT:  S  ADMIT:  09/04/2014          DSCH:  09/04/2014     MSV:  ________________________________________________________________________        DATE OF PROCEDURE: 09/04/2014.    PREOPERATIVE DIAGNOSIS: Right adnexal mass, rectocele.    POSTOPERATIVE DIAGNOSIS: Right adnexal mass, rectocele.    PROCEDURE: Diagnostic laparoscopy with right salpingo-oophorectomy,  enterolysis and rectocele repair.    SURGEON: Irven Easterly. Emalynn Clewis, MD.    ASSISTANT: Valora Corporal, MD.    ANESTHESIA: General.    COMPLICATIONS: None.    ESTIMATED BLOOD LOSS: Less than 20 mL.    FINDINGS: Large simple appearing right adnexal mass. Small bowel adherent  to the vaginal cuff and pelvic sidewall obscuring the right ovary.  Surgically absent uterus, left tube and ovary. Rectocele.     PATHOLOGY: Right tube and ovary.    COMPLICATIONS: None    PROCEDURE IN DETAIL: After informed consent was obtained, patient taken  to the operating room, prepped and draped in the usual sterile fashion in  dorsal lithotomy position. A small infraumbilical skin incision was made  with scalpel to allow placement of a 5 mm Covidien optical trocar. This  was inserted directly under observation without complication and the  abdomen insufflated with approximately 3 L of CO2 gas. Camera was placed.  An additional 11 mm port placed in left lateral quadrant under direct  observation without complication and a 5 mm port placed in the right  lateral quadrant also under direct  observation without  complication. Abdomen and pelvis were surveyed with the findings noted  above. The right adnexa was obscured by overlying bowel which was  adherent to the vaginal cuff and bladder reflection. Using EndoShears,  these adhesions were carefully and sharply taken down with good result  and excellent hemostasis. Great care taken to avoid injury to the bowel  serosa. Once the bowel was mobilized, the right ovary could be  visualized. There was noted to be a large simple appearing right ovarian  cyst; the ovary was freely mobile. The ovary was elevated. The right  ureter was identified and  visualized along its course, and using a  LigaSure device, the right infundibulopelvic ligament was grasped,  coagulated and cut to free completely the right ovary from the pelvic  sidewall. The ovary was too large to fit into an 56mm EndoCatch bag. The 11 mm port was removed   and a 15 mm Ethicon trocar placed in  the left lateral quadrant and a larger 15 mm EndoCatch bag placed through  this incision. Ovary was placed into the bag intact and bag withdrawn  through the incision. The ovary was then ruptured in the bags, fluid was  drained so the cyst could be removed intact in the EndoCatch bag. The ovary was removed intact no   spillage of ovarian contents occurred.  The 15 mm trocar was then replaced. Pelvis irrigated copiously and  inspected. No bleeding was noted. CO2 gas was then released, trocars  removed after using a fascial closure device to close the fascia at the  15 mm port site. Incisions were closed with 4-0 Monocryl and Dermabond.    Attention then turned to the rectocele. The vaginal mucosa posteriorly  was injected with dilute solution of Neo-Synephrine.    A transverse incision was made in the vaginal mucosa at the level of the  introitus and the vaginal mucosa then carefully and sharply dissected  off the puborectal fascia with good result. The rectocele was then  dissected off the overlying  vaginal mucosa sharply, also with good result  until the entire rectocele could be reduced. The rectocele was then  plicated using a series of interrupted 2-0 Vicryl mattress sutures. A  small amount of redundant vaginal tissue was then trimmed and the vaginal  mucosa closed with a running 3-0 Monocryl suture. A good result was  obtained and the vaginal mucosa was hemostatic. At this point, the  patient was taken down from lithotomy position. Foley catheter was  removed. Sponge, lap, needle, instrument counts were correct.  Patient tolerated the procedure well and was taken to recovery awake and in stable condition.                 Chrissie Noa, MD                This is an unverified document unless signed by physician.    TID:  [id number]                                      DT:  09/04/2014 03:14 P  JOB:  [id number]           DOC#:  [id number]           DD:  09/04/2014    cc:   Chrissie Noa, MD

## 2014-09-04 NOTE — Op Note (Signed)
[facility]                           [address]                            [place], [place]                                [phone]                                OPERATIVE REPORT  ________________________________________________________________________  NAME:  Lindsay Robbins, Lindsay Robbins                                 MR:  [id number]  LOC:  [id number]            SEX:  F               ACCT:  [id number]  DOB:  01-Apr-1951            AGE:  64              PT:  S  ADMIT:  09/04/2014          DSCH:  09/04/2014     MSV:  ________________________________________________________________________        DATE OF PROCEDURE: 09/04/2014.    PREOPERATIVE DIAGNOSIS: Right adnexal mass, rectocele.    POSTOPERATIVE DIAGNOSIS: Right adnexal mass, rectocele.    PROCEDURE: Diagnostic laparoscopy with right salpingo-oophorectomy,  enterolysis and rectocele repair.    SURGEON: Irven Easterly. Malea Swilling, MD.    ASSISTANT: Valora Corporal, MD.    ANESTHESIA: General.    COMPLICATIONS: None.    ESTIMATED BLOOD LOSS: Less than 20 mL.    FINDINGS: Large simple appearing right adnexal mass. Small bowel adherent  to the vaginal cuff and pelvic sidewall obscuring the right ovary.  Surgically absent uterus, left tube and ovary. Rectocele.     PATHOLOGY: Right tube and ovary.    COMPLICATIONS: None    PROCEDURE IN DETAIL: After informed consent was obtained, patient taken  to the operating room, prepped and draped in the usual sterile fashion in  dorsal lithotomy position. A small infraumbilical skin incision was made  with scalpel to allow placement of a 5 mm Covidien optical trocar. This  was inserted directly under observation without complication and the  abdomen insufflated with approximately 3 L of CO2 gas. Camera was placed.  An additional 11 mm port placed in left lateral quadrant under direct  observation without complication and a 5 mm port placed in the right   lateral quadrant also under direct observation without  complication. Abdomen and pelvis were surveyed with the findings noted  above. The right adnexa was obscured by overlying bowel which was  adherent to the vaginal cuff and bladder reflection. Using EndoShears,  these adhesions were carefully and sharply taken down with good result  and excellent hemostasis. Great care taken to avoid injury to the bowel  serosa. Once the bowel was mobilized, the right ovary could be  visualized. There was noted to be a large simple appearing right ovarian  cyst; the ovary was freely mobile. The ovary was elevated. The right  ureter was identified and  visualized along its course, and using a  LigaSure device, the right infundibulopelvic ligament was grasped,  coagulated and cut to free completely the right ovary from the pelvic  sidewall. The ovary was too large to fit into an 68mm EndoCatch bag. The 11 mm port was removed   and a 15 mm Ethicon trocar placed in  the left lateral quadrant and a larger 15 mm EndoCatch bag placed through  this incision. Ovary was placed into the bag intact and bag withdrawn  through the incision. The ovary was then ruptured in the bags, fluid was  drained so the cyst could be removed intact in the EndoCatch bag. The ovary was removed intact no   spillage of ovarian contents occurred.  The 15 mm trocar was then replaced. Pelvis irrigated copiously and  inspected. No bleeding was noted. CO2 gas was then released, trocars  removed after using a fascial closure device to close the fascia at the  15 mm port site. Incisions were closed with 4-0 Monocryl and Dermabond.    Attention then turned to the rectocele. The vaginal mucosa posteriorly  was injected with dilute solution of Neo-Synephrine.    A transverse incision was made in the vaginal mucosa at the level of the  introitus and the vaginal mucosa then carefully and sharply dissected  off the puborectal fascia with good result. The rectocele was then   dissected off the overlying vaginal mucosa sharply, also with good result  until the entire rectocele could be reduced. The rectocele was then  plicated using a series of interrupted 2-0 Vicryl mattress sutures. A  small amount of redundant vaginal tissue was then trimmed and the vaginal  mucosa closed with a running 3-0 Monocryl suture. A good result was  obtained and the vaginal mucosa was hemostatic. At this point, the  patient was taken down from lithotomy position. Foley catheter was  removed. Sponge, lap, needle, instrument counts were correct.  Patient tolerated the procedure well and was taken to recovery awake and in stable condition.                 Chrissie Noa, MD                This is an unverified document unless signed by physician.    TID:  [id number]                                      DT:  09/04/2014 03:14 P  JOB:  [id number]           DOC#:  [id number]           DD:  09/04/2014    cc:   Chrissie Noa, MD

## 2014-09-04 NOTE — Brief Op Note (Signed)
BRIEF OPERATIVE NOTE    Date of Procedure: 09/04/2014   Preoperative Diagnosis: Adnexal mass [N94.9]  Rectocele [N81.6]  Postoperative Diagnosis: Adnexal mass [N94.9]  Rectocele [N81.6]    Procedure(s):  RIGHT SALPINGO-OOPHORECTOMY LAPAROSCOPIC  RECTOCELE REPAIR  Surgeon(s) and Role:     * Chrissie Noa, MD - Primary     * Dossie Arbour, MD - Assisting  Anesthesia: General   Estimated Blood Loss: <44ml  Specimens:   ID Type Source Tests Collected by Time Destination   1 : RIGHT TUBE AND OVARY Preservative   Chrissie Noa, MD 09/04/2014 [phone] Pathology      Findings: dictated   Complications: none  Implants: * No implants in log *

## 2014-09-04 NOTE — Anesthesia Post-Procedure Evaluation (Signed)
Post-Anesthesia Evaluation and Assessment    Patient: Lindsay Robbins MRN: [id number]  SSN: [id number]    Date of Birth: 1951-06-19  Age: 64 y.o.  Sex: female       Cardiovascular Function/Vital Signs  Visit Vitals   Item Reading   ??? BP 140/75 mmHg   ??? Pulse 65   ??? Temp 36.2 ??C (97.2 ??F)   ??? Resp 16   ??? Ht 5\' 4"  (1.626 m)   ??? Wt 89.812 kg (198 lb)   ??? BMI 33.97 kg/m2   ??? SpO2 97%       Patient is status post general anesthesia for Procedure(s):  RIGHT SALPINGO-OOPHORECTOMY LAPAROSCOPIC  RECTOCELE REPAIR.    Nausea/Vomiting: None    Postoperative hydration reviewed and adequate.    Pain:  Pain Scale 1: Numeric (0 - 10) (09/04/14 1017)  Pain Intensity 1: 3 (09/04/14 1017)   Managed    Neurological Status:   Neuro (WDL): Within Defined Limits (09/04/14 1017)  Neuro  Neurologic State: Alert (09/04/14 1017)  Orientation Level: Oriented X4 (09/04/14 1017)  Speech: Clear (09/04/14 1017)  LUE Motor Response: Purposeful (09/04/14 0942)  LLE Motor Response: Purposeful (09/04/14 0942)  RUE Motor Response: Purposeful (09/04/14 0942)  RLE Motor Response: Purposeful (09/04/14 0942)   At baseline    Mental Status and Level of Consciousness: Alert and oriented     Pulmonary Status:   O2 Device: Room air (09/04/14 1032)   Adequate oxygenation and airway patent    Complications related to anesthesia: None    Post-anesthesia assessment completed. No concerns    Signed By: Maren Reamer, MD     September 04, 2014

## 2014-09-04 NOTE — Anesthesia Pre-Procedure Evaluation (Addendum)
Anesthetic History     PONV          Review of Systems / Medical History  Patient summary reviewed, nursing notes reviewed and pertinent labs reviewed    Pulmonary            Asthma (seasonal)        Neuro/Psych   Within defined limits           Cardiovascular  Within defined limits                Exercise tolerance: >4 METS     GI/Hepatic/Renal     GERD: well controlled           Endo/Other        Obesity     Other Findings            Physical Exam    Airway  Mallampati: II  TM Distance: 4 - 6 cm  Neck ROM: normal range of motion   Mouth opening: Normal     Cardiovascular    Rhythm: regular  Rate: normal         Dental         Pulmonary  Breath sounds clear to auscultation               Abdominal         Other Findings            Anesthetic Plan    ASA: 2  Anesthesia type: general          Induction: Intravenous  Anesthetic plan and risks discussed with: Patient and Family

## 2014-09-05 MED FILL — GLYCOPYRROLATE 0.2 MG/ML IJ SOLN: 0.2 mg/mL | INTRAMUSCULAR | Qty: 0.4

## 2014-09-05 MED FILL — PROPOFOL 10 MG/ML IV EMUL: 10 mg/mL | INTRAVENOUS | Qty: 180

## 2014-09-05 MED FILL — NEOSTIGMINE METHYLSULFATE 1 MG/ML INJECTION: 1 mg/mL | INTRAMUSCULAR | Qty: 3

## 2014-09-05 MED FILL — LIDOCAINE (PF) 20 MG/ML (2 %) IJ SOLN: 20 mg/mL (2 %) | INTRAMUSCULAR | Qty: 80

## 2014-09-05 MED FILL — ONDANSETRON (PF) 4 MG/2 ML INJECTION: 4 mg/2 mL | INTRAMUSCULAR | Qty: 4

## 2014-09-05 MED FILL — ROCURONIUM 10 MG/ML IV: 10 mg/mL | INTRAVENOUS | Qty: 35

## 2014-09-05 MED FILL — KETOROLAC TROMETHAMINE 30 MG/ML INJECTION: 30 mg/mL (1 mL) | INTRAMUSCULAR | Qty: 1

## 2014-09-05 MED FILL — DEXAMETHASONE SODIUM PHOSPHATE 4 MG/ML IJ SOLN: 4 mg/mL | INTRAMUSCULAR | Qty: 8

## 2014-09-05 MED FILL — ARTIFICIAL TEARS (PETROLATUM/MINERAL OIL) 83 %-15 % EYE OINTMENT: 83-15 % | OPHTHALMIC | Qty: 3.5

## 2014-09-05 MED FILL — EPHEDRINE SULFATE 50 MG/ML IJ SOLN: 50 mg/mL | INTRAMUSCULAR | Qty: 10

## 2014-09-07 NOTE — Progress Notes (Signed)
Quick Note:        Patient notified of benign pathology    ______

## 2014-09-17 ENCOUNTER — Ambulatory Visit
Admit: 2014-09-17 | Discharge: 2014-09-17 | Payer: BLUE CROSS/BLUE SHIELD | Attending: Obstetrics & Gynecology | Primary: Family Medicine

## 2014-09-17 DIAGNOSIS — Z09 Encounter for follow-up examination after completed treatment for conditions other than malignant neoplasm: Secondary | ICD-10-CM

## 2014-09-17 NOTE — Progress Notes (Signed)
HISTORY OF PRESENT ILLNESS  Lindsay Robbins is a 64 y.o. female.  HPI Patient presents today for a post-op visit.    Surgery Performed: Diagnostic Laparoscopy, right salpingoopherectomy, Enterolysis, Rectocele repair  Surgery Date: 09-04-14      Review of Systems   Constitutional: Negative for fever, chills, weight loss and malaise/fatigue.   Respiratory: Negative for cough and shortness of breath.    Cardiovascular: Negative for chest pain and orthopnea.   Gastrointestinal: Negative for nausea, vomiting, abdominal pain, diarrhea, constipation and blood in stool.   Genitourinary: Negative for dysuria, urgency and frequency.   Neurological: Negative for headaches.   Psychiatric/Behavioral: Negative for depression and suicidal ideas. The patient is not nervous/anxious and does not have insomnia.        Physical Exam   Constitutional: She is oriented to person, place, and time. She appears well-developed and well-nourished. No distress.   HENT:   Head: Normocephalic.   Eyes: Conjunctivae are normal. Pupils are equal, round, and reactive to light.   Neck: Normal range of motion. Neck supple.   Cardiovascular: Normal rate.    Pulmonary/Chest: Effort normal. No respiratory distress.   Abdominal: Soft. She exhibits no distension and no mass. There is no tenderness. There is no rebound and no guarding.   Incisions well healed.   Musculoskeletal: Normal range of motion.   Neurological: She is alert and oriented to person, place, and time.   Skin: Skin is warm and dry.   Psychiatric: She has a normal mood and affect. Her behavior is normal. Judgment and thought content normal.   Vitals reviewed.      ASSESSMENT and PLAN    ICD-10-CM ICD-9-CM    1. Postop check Z09 V67.00      Follow-up Disposition:  Return in about 4 weeks (around 10/15/2014).  Continue pelvic rest. All questions answered.

## 2014-10-02 NOTE — Other (Signed)
CHART AUDIT

## 2014-10-09 ENCOUNTER — Encounter: Attending: Obstetrics & Gynecology | Primary: Family Medicine

## 2014-10-14 ENCOUNTER — Ambulatory Visit
Admit: 2014-10-14 | Discharge: 2014-10-14 | Payer: BLUE CROSS/BLUE SHIELD | Attending: Obstetrics & Gynecology | Primary: Family Medicine

## 2014-10-14 DIAGNOSIS — Z09 Encounter for follow-up examination after completed treatment for conditions other than malignant neoplasm: Secondary | ICD-10-CM

## 2014-10-14 NOTE — Progress Notes (Signed)
HISTORY OF PRESENT ILLNESS  Lindsay Robbins is a 64 y.o. female.  HPI Patient presents today for a post-op visit from laparoscopic RSO and rectocele repair 6 weeks ago. No complaints today.           Review of Systems   Constitutional: Negative for fever, chills, weight loss and malaise/fatigue.   Respiratory: Negative for cough and shortness of breath.    Cardiovascular: Negative for chest pain and orthopnea.   Gastrointestinal: Negative for nausea, vomiting, abdominal pain, diarrhea, constipation and blood in stool.   Genitourinary: Negative for dysuria, urgency and frequency.   Neurological: Negative for headaches.   Psychiatric/Behavioral: Negative for depression and suicidal ideas. The patient is not nervous/anxious and does not have insomnia.        Physical Exam   Constitutional: She is oriented to person, place, and time. She appears well-developed and well-nourished. No distress.   HENT:   Head: Normocephalic.   Eyes: Conjunctivae are normal. Pupils are equal, round, and reactive to light.   Neck: Normal range of motion. Neck supple.   Cardiovascular: Normal rate.    Pulmonary/Chest: Effort normal. No respiratory distress.   Abdominal: Soft. She exhibits no distension and no mass. There is no tenderness. There is no rebound and no guarding.   Genitourinary: No erythema, tenderness or bleeding in the vagina. No signs of injury around the vagina.   Posterior vaginal wall healed well.    Musculoskeletal: Normal range of motion.   Neurological: She is alert and oriented to person, place, and time.   Skin: Skin is warm and dry.   Psychiatric: She has a normal mood and affect. Her behavior is normal. Judgment and thought content normal.   Vitals reviewed.      ASSESSMENT and PLAN    ICD-10-CM ICD-9-CM    1. Postop check Z09 V67.00    2. Rectocele N81.6 618.04      Follow-up Disposition:  Return in about 1 year (around 10/14/2015).  All questions answered. Has done well postop.

## 2014-10-15 ENCOUNTER — Encounter: Attending: Obstetrics & Gynecology | Primary: Family Medicine

## 2015-03-09 ENCOUNTER — Encounter

## 2015-05-07 ENCOUNTER — Inpatient Hospital Stay: Admit: 2015-05-07 | Payer: BLUE CROSS/BLUE SHIELD | Attending: Family Medicine | Primary: Family Medicine

## 2015-05-07 DIAGNOSIS — Z1231 Encounter for screening mammogram for malignant neoplasm of breast: Secondary | ICD-10-CM

## 2015-05-10 ENCOUNTER — Encounter

## 2015-05-17 ENCOUNTER — Inpatient Hospital Stay: Admit: 2015-05-17 | Payer: BLUE CROSS/BLUE SHIELD | Attending: Family Medicine | Primary: Family Medicine

## 2015-05-17 DIAGNOSIS — N6001 Solitary cyst of right breast: Secondary | ICD-10-CM

## 2015-05-17 DIAGNOSIS — R928 Other abnormal and inconclusive findings on diagnostic imaging of breast: Secondary | ICD-10-CM

## 2015-05-17 LAB — HM MAMMOGRAPHY

## 2015-08-24 LAB — HEPATIC FUNCTION PANEL
ALT: 16 (ref 7–35)
AST: 25 (ref 13–35)
Alkaline Phosphatase: 72 (ref 25–125)
BILIRUBIN, TOTAL: 0.3

## 2015-08-24 LAB — CBC AND DIFFERENTIAL
HEMATOCRIT: 44 (ref 36–46)
HEMOGLOBIN: 13.6 (ref 12.0–16.0)
PLATELETS: 217 (ref 150–399)
WBC: 9.1

## 2015-08-24 LAB — HEMOGLOBIN A1C: HEMOGLOBIN A1C: 5.9

## 2015-08-24 LAB — VITAMIN B12: VITAMIN B 12: 564

## 2015-08-24 LAB — BASIC METABOLIC PANEL
BUN: 10 (ref 4–21)
CREATININE: 0.7 (ref <=1.1)
GLUCOSE: 98
POTASSIUM: 4.7 (ref 3.4–5.3)
SODIUM: 143 (ref 137–147)

## 2015-08-24 LAB — LIPID PANEL
Cholesterol: 139 (ref 0–200)
HDL: 73 — AB (ref 35–70)
LDL Cholesterol: 48
TRIGLYCERIDES: 92 (ref 40–160)

## 2015-08-24 LAB — TSH: TSH: 1.45 (ref <=5.90)

## 2015-08-24 LAB — VITAMIN D 25 HYDROXY (VIT D DEFICIENCY, FRACTURES): Vit D, 25-Hydroxy: 22.8

## 2015-08-30 ENCOUNTER — Encounter

## 2015-09-17 ENCOUNTER — Inpatient Hospital Stay: Payer: BLUE CROSS/BLUE SHIELD | Attending: Family Medicine | Primary: Family Medicine

## 2015-10-23 HISTORY — PX: COLONOSCOPY: SHX174

## 2015-11-11 LAB — HM COLONOSCOPY

## 2015-11-19 ENCOUNTER — Inpatient Hospital Stay: Admit: 2015-11-19 | Payer: BLUE CROSS/BLUE SHIELD | Attending: Family Medicine | Primary: Family Medicine

## 2015-11-19 DIAGNOSIS — M8580 Other specified disorders of bone density and structure, unspecified site: Secondary | ICD-10-CM

## 2015-11-19 LAB — HM DEXA SCAN

## 2017-04-26 ENCOUNTER — Encounter: Payer: Self-pay | Admitting: Internal Medicine

## 2017-04-26 ENCOUNTER — Ambulatory Visit (INDEPENDENT_AMBULATORY_CARE_PROVIDER_SITE_OTHER): Payer: BLUE CROSS/BLUE SHIELD | Admitting: Internal Medicine

## 2017-04-26 ENCOUNTER — Encounter (INDEPENDENT_AMBULATORY_CARE_PROVIDER_SITE_OTHER): Payer: Self-pay

## 2017-04-26 VITALS — BP 116/78 | HR 58 | Temp 98.0°F | Ht 64.5 in | Wt 190.0 lb

## 2017-04-26 DIAGNOSIS — F329 Major depressive disorder, single episode, unspecified: Secondary | ICD-10-CM

## 2017-04-26 DIAGNOSIS — M1990 Unspecified osteoarthritis, unspecified site: Secondary | ICD-10-CM | POA: Insufficient documentation

## 2017-04-26 DIAGNOSIS — Z1159 Encounter for screening for other viral diseases: Secondary | ICD-10-CM

## 2017-04-26 DIAGNOSIS — E7800 Pure hypercholesterolemia, unspecified: Secondary | ICD-10-CM | POA: Insufficient documentation

## 2017-04-26 DIAGNOSIS — Z23 Encounter for immunization: Secondary | ICD-10-CM | POA: Diagnosis not present

## 2017-04-26 DIAGNOSIS — F419 Anxiety disorder, unspecified: Secondary | ICD-10-CM

## 2017-04-26 DIAGNOSIS — K5090 Crohn's disease, unspecified, without complications: Secondary | ICD-10-CM | POA: Diagnosis not present

## 2017-04-26 DIAGNOSIS — J4520 Mild intermittent asthma, uncomplicated: Secondary | ICD-10-CM | POA: Diagnosis not present

## 2017-04-26 DIAGNOSIS — K219 Gastro-esophageal reflux disease without esophagitis: Secondary | ICD-10-CM | POA: Insufficient documentation

## 2017-04-26 DIAGNOSIS — E785 Hyperlipidemia, unspecified: Secondary | ICD-10-CM | POA: Insufficient documentation

## 2017-04-26 DIAGNOSIS — J45909 Unspecified asthma, uncomplicated: Secondary | ICD-10-CM | POA: Insufficient documentation

## 2017-04-26 DIAGNOSIS — M170 Bilateral primary osteoarthritis of knee: Secondary | ICD-10-CM

## 2017-04-26 DIAGNOSIS — F32A Depression, unspecified: Secondary | ICD-10-CM

## 2017-04-26 LAB — COMPREHENSIVE METABOLIC PANEL
ALT: 16 U/L (ref 0–35)
AST: 18 U/L (ref 0–37)
Albumin: 3.8 g/dL (ref 3.5–5.2)
Alkaline Phosphatase: 60 U/L (ref 39–117)
BUN: 9 mg/dL (ref 6–23)
CHLORIDE: 103 meq/L (ref 96–112)
CO2: 26 meq/L (ref 19–32)
CREATININE: 0.6 mg/dL (ref 0.40–1.20)
Calcium: 9.3 mg/dL (ref 8.4–10.5)
GFR: 106.81 mL/min (ref >60.00)
Glucose, Bld: 96 mg/dL (ref 70–99)
POTASSIUM: 3.8 meq/L (ref 3.5–5.1)
SODIUM: 138 meq/L (ref 135–145)
Total Bilirubin: 0.4 mg/dL (ref 0.2–1.2)
Total Protein: 6.8 g/dL (ref 6.0–8.3)

## 2017-04-26 LAB — CBC
HEMATOCRIT: 40.2 % (ref 36.0–46.0)
HEMOGLOBIN: 13.2 g/dL (ref 12.0–15.0)
MCHC: 32.8 g/dL (ref 30.0–36.0)
MCV: 92.1 fl (ref 78.0–100.0)
Platelets: 216 10*3/uL (ref 150.0–400.0)
RBC: 4.37 Mil/uL (ref 3.87–5.11)
RDW: 12.6 % (ref 11.5–15.5)
WBC: 8.7 10*3/uL (ref 4.0–10.5)

## 2017-04-26 LAB — LIPID PANEL
CHOL/HDL RATIO: 3
Cholesterol: 152 mg/dL (ref 0–200)
HDL: 54.7 mg/dL (ref >39.00)
LDL CALC: 70 mg/dL (ref 0–99)
NonHDL: 97.67
Triglycerides: 140 mg/dL (ref 0.0–149.0)
VLDL: 28 mg/dL (ref 0.0–40.0)

## 2017-04-26 LAB — VITAMIN D 25 HYDROXY (VIT D DEFICIENCY, FRACTURES): VITD: 49.16 ng/mL (ref 30.00–100.00)

## 2017-04-26 LAB — HEMOGLOBIN A1C: HEMOGLOBIN A1C: 6 % (ref 4.6–6.5)

## 2017-04-26 NOTE — Patient Instructions (Signed)
Fat and Cholesterol Restricted Diet Getting too much fat and cholesterol in your diet may cause health problems. Following this diet helps keep your fat and cholesterol at normal levels. This can keep you from getting sick. What types of fat should I choose?  Choose monosaturated and polyunsaturated fats. These are found in foods such as olive oil, canola oil, flaxseeds, walnuts, almonds, and seeds.  Eat more omega-3 fats. Good choices include salmon, mackerel, sardines, tuna, flaxseed oil, and ground flaxseeds.  Limit saturated fats. These are in animal products such as meats, butter, and cream. They can also be in plant products such as palm oil, palm kernel oil, and coconut oil.  Avoid foods with partially hydrogenated oils in them. These contain trans fats. Examples of foods that have trans fats are stick margarine, some tub margarines, cookies, crackers, and other baked goods. What general guidelines do I need to follow?  Check food labels. Look for the words "trans fat" and "saturated fat."  When preparing a meal: ? Fill half of your plate with vegetables and green salads. ? Fill one fourth of your plate with whole grains. Look for the word "whole" as the first word in the ingredient list. ? Fill one fourth of your plate with lean protein foods.  Eat more foods that have fiber, like apples, carrots, beans, peas, and barley.  Eat more home-cooked foods. Eat less at restaurants and buffets.  Limit or avoid alcohol.  Limit foods high in starch and sugar.  Limit fried foods.  Cook foods without frying them. Baking, boiling, grilling, and broiling are all great options.  Lose weight if you are overweight. Losing even a small amount of weight can help your overall health. It can also help prevent diseases such as diabetes and heart disease. What foods can I eat? Grains Whole grains, such as whole wheat or whole grain breads, crackers, cereals, and pasta. Unsweetened oatmeal,  bulgur, barley, quinoa, or brown rice. Corn or whole wheat flour tortillas. Vegetables Fresh or frozen vegetables (raw, steamed, roasted, or grilled). Green salads. Fruits All fresh, canned (in natural juice), or frozen fruits. Meat and Other Protein Products Ground beef (85% or leaner), grass-fed beef, or beef trimmed of fat. Skinless chicken or turkey. Ground chicken or turkey. Pork trimmed of fat. All fish and seafood. Eggs. Dried beans, peas, or lentils. Unsalted nuts or seeds. Unsalted canned or dry beans. Dairy Low-fat dairy products, such as skim or 1% milk, 2% or reduced-fat cheeses, low-fat ricotta or cottage cheese, or plain low-fat yogurt. Fats and Oils Tub margarines without trans fats. Light or reduced-fat mayonnaise and salad dressings. Avocado. Olive, canola, sesame, or safflower oils. Natural peanut or almond butter (choose ones without added sugar and oil). The items listed above may not be a complete list of recommended foods or beverages. Contact your dietitian for more options. What foods are not recommended? Grains White bread. White pasta. White rice. Cornbread. Bagels, pastries, and croissants. Crackers that contain trans fat. Vegetables White potatoes. Corn. Creamed or fried vegetables. Vegetables in a cheese sauce. Fruits Dried fruits. Canned fruit in light or heavy syrup. Fruit juice. Meat and Other Protein Products Fatty cuts of meat. Ribs, chicken wings, bacon, sausage, bologna, salami, chitterlings, fatback, hot dogs, bratwurst, and packaged luncheon meats. Liver and organ meats. Dairy Whole or 2% milk, cream, half-and-half, and cream cheese. Whole milk cheeses. Whole-fat or sweetened yogurt. Full-fat cheeses. Nondairy creamers and whipped toppings. Processed cheese, cheese spreads, or cheese curds. Sweets and Desserts Corn   syrup, sugars, honey, and molasses. Candy. Jam and jelly. Syrup. Sweetened cereals. Cookies, pies, cakes, donuts, muffins, and ice  cream. Fats and Oils Butter, stick margarine, lard, shortening, ghee, or bacon fat. Coconut, palm kernel, or palm oils. Beverages Alcohol. Sweetened drinks (such as sodas, lemonade, and fruit drinks or punches). The items listed above may not be a complete list of foods and beverages to avoid. Contact your dietitian for more information. This information is not intended to replace advice given to you by your health care provider. Make sure you discuss any questions you have with your health care provider. Document Released: 01/09/2012 Document Revised: 03/16/2016 Document Reviewed: 10/09/2013 Elsevier Interactive Patient Education  2018 [employer]  

## 2017-04-26 NOTE — Assessment & Plan Note (Signed)
Controlled on Celebrex Encouraged weight loss

## 2017-04-26 NOTE — Assessment & Plan Note (Signed)
Advised her toavoid spicy foods Can takes Tums as needed

## 2017-04-26 NOTE — Assessment & Plan Note (Signed)
CMET and Lipid profile today Encouraged her to consume a low fat diet Continue Crestor for now

## 2017-04-26 NOTE — Assessment & Plan Note (Signed)
Continue Zyrtec and Albuterol prn

## 2017-04-26 NOTE — Assessment & Plan Note (Signed)
Uncomplicated off meds Will monitor

## 2017-04-26 NOTE — Progress Notes (Signed)
HPI  Pt presents to the clinic today to establish care and for management of the conditions listed below. She is transferring care from [facility].  Allergy Induced Asthma: She does use Zyrtec and Albuterol inhaler as needed with good relief.  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 She takes Celebrex daily with good relief. She has tried Diclofenac but reports she stopped taking it due possible side effects.  Anxiety and Depression: Improved since retirement but she reports she has had this her entire life. She is taking Lexapro daily as prescribed. She denies SI/HI.  GERD: Triggered by spicy foods. She has a hiatal hernia. She does not taken anything OTC for this.   HLD: She is not sure the last time her cholesterol was checked. She is taking Crestor as prescribed. She denies myalgias. She tries to consume a low fat diet.  Crohn's: She reports this is not active. She is not taking any medication for this. She denies rectal bleeding.   Flu: 04/2016 Tetanus: 2010 Zostovax: never Pap Smear: 2014, total hysterectomy Mammogram: 2017 Colon Screening: 2017, every 3 years Vision Screening yearly Dentist: yearly  Past Medical History:  Diagnosis Date  . Allergy   . Allergy-induced asthma   . Arthritis   . Depression   . GERD (gastroesophageal reflux disease)   . History of chicken pox   . History of shingles   . Hyperlipidemia     Current Outpatient Prescriptions  Medication Sig Dispense Refill  . albuterol (PROVENTIL HFA;VENTOLIN HFA) 108 (90 Base) MCG/ACT inhaler Inhale 1-2 puffs into the lungs every 6 (six) hours as needed for wheezing or shortness of breath.    Marland Kitchen aspirin (ECOTRIN LOW STRENGTH) 81 MG EC tablet Take 81 mg by mouth daily. Swallow whole.    . celecoxib (CELEBREX) 200 MG capsule Take 200 mg by mouth daily.    . Cholecalciferol (VITAMIN D3) 50000 units CAPS Take 1 capsule by mouth once a week.  5  . escitalopram (LEXAPRO) 10 MG tablet Take 10 mg by mouth  daily.    Marland Kitchen estradiol (ESTRACE) 1 MG tablet Take 1.5 mg by mouth daily.    . magnesium gluconate (MAGONATE) 500 MG tablet Take 500 mg by mouth daily.    . rosuvastatin (CRESTOR) 10 MG tablet Take 10 mg by mouth daily.     No current facility-administered medications for this visit.     Allergies  Allergen Reactions  . Codeine Itching    Family History  Problem Relation Age of Onset  . Diabetes Mother   . Atrial fibrillation Mother   . Arthritis Father   . Asthma Father   . Heart disease Father   . Hyperlipidemia Father   . Hypertension Father   . Kidney disease Father   . Diabetes Brother   . Hypertension Brother   . Depression Maternal Grandmother   . Heart disease Maternal Grandfather   . Hypertension Paternal Grandfather   . Stroke Paternal Grandfather     Social History   Social History  . Marital status: Married    Spouse name: N/A  . Number of children: N/A  . Years of education: N/A   Occupational History  . Not on file.   Social History Main Topics  . Smoking status: Never Smoker  . Smokeless tobacco: Never Used  . Alcohol use No  . Drug use: No  . Sexual activity: Not on file   Other Topics Concern  . Not on file   Social History Narrative  .  No narrative on file    ROS:  Constitutional: Denies fever, malaise, fatigue, headache or abrupt weight changes.  HEENT: Denies eye pain, eye redness, ear pain, ringing in the ears, wax buildup, runny nose, nasal congestion, bloody nose, or sore throat. Respiratory: Denies difficulty breathing, shortness of breath, cough or sputum production.   Cardiovascular: Denies chest pain, chest tightness, palpitations or swelling in the hands or feet.  Gastrointestinal: Pt reports intermittent reflux. Denies abdominal pain, bloating, constipation, diarrhea or blood in the stool.  GU: Denies frequency, urgency, pain with urination, blood in urine, odor or discharge. Musculoskeletal: Pt reports intermittent knee pain.  Denies decrease in range of motion, difficulty with gait, muscle pain or joint swelling.  Skin: Denies redness, rashes, lesions or ulcercations.  Neurological: Denies dizziness, difficulty with memory, difficulty with speech or problems with balance and coordination.  Psych: Pt reports history of anxiety and depression. Denies SI/HI.  No other specific complaints in a complete review of systems (except as listed in HPI above).  PE:  BP 116/78   Pulse (!) 58   Temp 98 F (36.7 C) (Oral)   Ht 5' 4.5" (1.638 m)   Wt 190 lb (86.2 kg)   SpO2 98%   BMI 32.11 kg/m   Wt Readings from Last 3 Encounters:  No data found for Wt    General: Appears her stated age, obese in NAD. Skin: Dry and intact. Cardiovascular: Normal rate and rhythm. S1,S2 noted.  No murmur, rubs or gallops noted.  Pulmonary/Chest: Normal effort and positive vesicular breath sounds. No respiratory distress. No wheezes, rales or ronchi noted.  Abdomen: Soft and nontender. Normal bowel sounds Musculoskeletal: No difficulty with gait.  Neurological: Alert and oriented.   Psychiatric: Mood and affect normal. Behavior is normal. Judgment and thought content normal.    Assessment and Plan:  Screen for Hep C:  Hep C antibody today  Make an appt for your annual exam Webb Silversmith, NP

## 2017-04-26 NOTE — Assessment & Plan Note (Signed)
Chronic but stable on Lexapro Will monitor

## 2017-04-26 NOTE — Addendum Note (Signed)
Addended by: Lurlean Nanny on: 04/26/2017 12:18 PM   Modules accepted: Orders

## 2017-04-27 LAB — HEPATITIS C ANTIBODY
HEP C AB: NONREACTIVE
SIGNAL TO CUT-OFF: 0.01 (ref <1.00)

## 2017-05-15 ENCOUNTER — Encounter: Payer: Self-pay | Admitting: Internal Medicine

## 2017-05-21 MED ORDER — CELECOXIB 200 MG PO CAPS: 200.0000 mg | ORAL_CAPSULE | Freq: Every day | ORAL | 2 refills | Status: DC

## 2017-05-21 MED ORDER — ESCITALOPRAM OXALATE 10 MG PO TABS: 10.0000 mg | ORAL_TABLET | Freq: Every day | ORAL | 2 refills | Status: DC

## 2017-08-02 ENCOUNTER — Ambulatory Visit: Payer: BLUE CROSS/BLUE SHIELD | Admitting: Family Medicine

## 2017-08-02 ENCOUNTER — Encounter: Payer: Self-pay | Admitting: Family Medicine

## 2017-08-02 VITALS — BP 140/72 | HR 62 | Temp 97.7°F | Wt 190.5 lb

## 2017-08-02 DIAGNOSIS — H8110 Benign paroxysmal vertigo, unspecified ear: Secondary | ICD-10-CM | POA: Diagnosis not present

## 2017-08-02 MED ORDER — MECLIZINE HCL 25 MG PO TABS: 12.5000 mg | ORAL_TABLET | Freq: Three times a day (TID) | ORAL | 0 refills | Status: DC | PRN

## 2017-08-02 MED ORDER — ROSUVASTATIN CALCIUM 10 MG PO TABS: 10.0000 mg | ORAL_TABLET | Freq: Every day | ORAL | 0 refills | Status: DC

## 2017-08-02 MED ORDER — ESTRADIOL 1 MG PO TABS: 1.5000 mg | ORAL_TABLET | Freq: Every day | ORAL | 0 refills | Status: DC

## 2017-08-02 NOTE — Progress Notes (Signed)
On HRT at baseline and has f/u with PCP pending.  Needed refill on HRT in the meantime.    Needed refill on crestor.  Has tolerated in the meantime.    Was in [place] about 10 days ago.  Woke up with vertigo.  Had it for about 3 days, got better, then sx came back this week.  Feeling of pressure in the L ear.  Hearing is at baseline.    She had room spinning with nystagmus noted by others.   The room would spin clockwise per patient report.   Nausea with the episodes.   Sx worse with head movement.  She has tried bedside exercises w/o relief.    She hasn't tried meclizine.    Meds, vitals, and allergies reviewed.   ROS: Per HPI unless specifically indicated in ROS section   GEN: nad, alert and oriented HEENT: mucous membranes moist, TM wnl except for sluggish L TM movement with valsalva NECK: supple w/o LA CV: rrr PULM: ctab, no inc wob ABD: soft, +bs EXT: no edema DHP positive with nystagmus with leaning back and looking to the L CN 2-12 wnl B, S/S wnl x4

## 2017-08-02 NOTE — Patient Instructions (Signed)
Use flonase and claritin for the ear congestion.  Use meclizine with the bedside exercises for BPV.  Take care.  Glad to see you.

## 2017-08-03 DIAGNOSIS — H8110 Benign paroxysmal vertigo, unspecified ear: Secondary | ICD-10-CM | POA: Insufficient documentation

## 2017-08-03 NOTE — Assessment & Plan Note (Signed)
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  D/w pt about both.  Use flonase and claritin for the ear congestion.  Use meclizine with the bedside exercises for BPV.  Update [place] as needed.   Okay for outpatient f/u.   Other meds refilled in the meantime, until she can f/u with PCP.

## 2017-08-09 ENCOUNTER — Encounter: Payer: Self-pay | Admitting: Internal Medicine

## 2017-08-09 ENCOUNTER — Ambulatory Visit (INDEPENDENT_AMBULATORY_CARE_PROVIDER_SITE_OTHER): Payer: BLUE CROSS/BLUE SHIELD | Admitting: Internal Medicine

## 2017-08-09 VITALS — BP 114/76 | HR 67 | Temp 98.1°F | Ht 64.0 in | Wt 188.5 lb

## 2017-08-09 DIAGNOSIS — Z1231 Encounter for screening mammogram for malignant neoplasm of breast: Secondary | ICD-10-CM

## 2017-08-09 DIAGNOSIS — Z0000 Encounter for general adult medical examination without abnormal findings: Secondary | ICD-10-CM

## 2017-08-09 DIAGNOSIS — Z1239 Encounter for other screening for malignant neoplasm of breast: Secondary | ICD-10-CM

## 2017-08-09 NOTE — Progress Notes (Signed)
Subjective:    Patient ID: Susan Norris, female    DOB: [birth year], 67 y.o.   MRN: [id number]  HPI  Pt presents to the clinic today for her annual exam.  Flu: 04/2017 Tetanus: 2010 Pap Smear: 2014, total hysterectomy Mammogram: 2017 Bone Density Exam: 10/2015 Colon Screening: 2017, 3 years Vision Screening: annually Dentist: annually  Diet: She does eat meat. She consumes veggies more than fruits. She does eat fried foods. She drinks mostly water. Exercise: None  Review of Systems      Past Medical History:  Diagnosis Date  . Allergy   . Allergy-induced asthma   . Arthritis   . Depression   . GERD (gastroesophageal reflux disease)   . History of chicken pox   . History of shingles   . Hyperlipidemia     Current Outpatient Medications  Medication Sig Dispense Refill  . albuterol (PROVENTIL HFA;VENTOLIN HFA) 108 (90 Base) MCG/ACT inhaler Inhale 1-2 puffs into the lungs every 6 (six) hours as needed for wheezing or shortness of breath.    Marland Kitchen aspirin (ECOTRIN LOW STRENGTH) 81 MG EC tablet Take 81 mg by mouth daily. Swallow whole.    . celecoxib (CELEBREX) 200 MG capsule Take 1 capsule (200 mg total) by mouth daily. 30 capsule 2  . Cholecalciferol (VITAMIN D3) 50000 units CAPS Take one capsule by mouth twice weekly.  5  . escitalopram (LEXAPRO) 10 MG tablet Take 1 tablet (10 mg total) by mouth daily. 30 tablet 2  . estradiol (ESTRACE) 1 MG tablet Take 1.5 tablets (1.5 mg total) by mouth daily. 135 tablet 0  . magnesium gluconate (MAGONATE) 500 MG tablet Take 500 mg by mouth daily.    . meclizine (ANTIVERT) 25 MG tablet Take 0.5-1 tablets (12.5-25 mg total) by mouth 3 (three) times daily as needed for dizziness. 30 tablet 0  . rosuvastatin (CRESTOR) 10 MG tablet Take 1 tablet (10 mg total) by mouth daily. 90 tablet 0   No current facility-administered medications for this visit.     Allergies  Allergen Reactions  . Codeine Itching    Family History  Problem Relation  Age of Onset  . Diabetes Mother   . Atrial fibrillation Mother   . Arthritis Father   . Asthma Father   . Heart disease Father   . Hyperlipidemia Father   . Hypertension Father   . Kidney disease Father   . Diabetes Brother   . Hypertension Brother   . Depression Maternal Grandmother   . Heart disease Maternal Grandfather   . Hypertension Paternal Grandfather   . Stroke Paternal Grandfather     Social History   Socioeconomic History  . Marital status: Married    Spouse name: Not on file  . Number of children: Not on file  . Years of education: Not on file  . Highest education level: Not on file  Social Needs  . Financial resource strain: Not on file  . Food insecurity - worry: Not on file  . Food insecurity - inability: Not on file  . Transportation needs - medical: Not on file  . Transportation needs - non-medical: Not on file  Occupational History  . Not on file  Tobacco Use  . Smoking status: Never Smoker  . Smokeless tobacco: Never Used  Substance and Sexual Activity  . Alcohol use: No  . Drug use: No  . Sexual activity: Yes  Other Topics Concern  . Not on file  Social History Narrative  . Not  on file     Constitutional: Denies fever, malaise, fatigue, headache or abrupt weight changes.  HEENT: Denies eye pain, eye redness, ear pain, ringing in the ears, wax buildup, runny nose, nasal congestion, bloody nose, or sore throat. Respiratory: Denies difficulty breathing, shortness of breath, cough or sputum production.   Cardiovascular: Denies chest pain, chest tightness, palpitations or swelling in the hands or feet.  Gastrointestinal: Denies abdominal pain, bloating, constipation, diarrhea or blood in the stool.  GU: Denies urgency, frequency, pain with urination, burning sensation, blood in urine, odor or discharge. Musculoskeletal: Denies decrease in range of motion, difficulty with gait, muscle pain or joint pain and swelling.  Skin: Denies redness, rashes,  lesions or ulcercations.  Neurological: Pt reports intermittent dizziness. Denies difficulty with memory, difficulty with speech or problems with balance and coordination.  Psych: Denies anxiety, depression, SI/HI.  No other specific complaints in a complete review of systems (except as listed in HPI above).  Objective:   Physical Exam   BP 114/76   Pulse 67   Temp 98.1 F (36.7 C) (Oral)   Ht 5' 4"  (1.626 m)   Wt 188 lb 8 oz (85.5 kg)   SpO2 98%   BMI 32.36 kg/m  Wt Readings from Last 3 Encounters:  08/09/17 188 lb 8 oz (85.5 kg)  08/02/17 190 lb 8 oz (86.4 kg)  04/26/17 190 lb (86.2 kg)    General: Appears her stated age, obese in NAD. Skin: Warm, dry and intact. No rashes, lesions or ulcerations noted. HEENT: Head: normal shape and size; Eyes: sclera white, no icterus, conjunctiva pink, PERRLA and EOMs intact; Ears: Tm's gray and intact, normal light reflex; Throat/Mouth: Teeth present, mucosa pink and moist, no exudate, lesions or ulcerations noted.  Neck:  Neck supple, trachea midline. No masses, lumps or thyromegaly present.  Cardiovascular: Normal rate and rhythm. S1,S2 noted.  No murmur, rubs or gallops noted. No JVD or BLE edema. No carotid bruits noted. Pulmonary/Chest: Normal effort and positive vesicular breath sounds. No respiratory distress. No wheezes, rales or ronchi noted.  Abdomen: Soft and nontender. Normal bowel sounds. No distention or masses noted. Liver, spleen and kidneys non palpable. Musculoskeletal: Strength 5/5 BUE/BLE. No difficulty with gait.  Neurological: Alert and oriented. Cranial nerves II-XII grossly intact. Coordination normal.  Psychiatric: Mood and affect normal. Behavior is normal. Judgment and thought content normal.    BMET    Component Value Date/Time   NA 138 04/26/2017 1149   NA 143 08/24/2015   K 3.8 04/26/2017 1149   CL 103 04/26/2017 1149   CO2 26 04/26/2017 1149   GLUCOSE 96 04/26/2017 1149   BUN 9 04/26/2017 1149   BUN  10 08/24/2015   CREATININE 0.60 04/26/2017 1149   CALCIUM 9.3 04/26/2017 1149    Lipid Panel     Component Value Date/Time   CHOL 152 04/26/2017 1149   TRIG 140.0 04/26/2017 1149   HDL 54.70 04/26/2017 1149   CHOLHDL 3 04/26/2017 1149   VLDL 28.0 04/26/2017 1149   LDLCALC 70 04/26/2017 1149    CBC    Component Value Date/Time   WBC 8.7 04/26/2017 1149   RBC 4.37 04/26/2017 1149   HGB 13.2 04/26/2017 1149   HCT 40.2 04/26/2017 1149   PLT 216.0 04/26/2017 1149   MCV 92.1 04/26/2017 1149   MCHC 32.8 04/26/2017 1149   RDW 12.6 04/26/2017 1149    Hgb A1C Lab Results  Component Value Date   HGBA1C 6.0 04/26/2017  Assessment & Plan:   Preventative Health Maintenance:  Flu and tetanus UTD She no longer needs pap smears Mammogram ordered, she will call [facility] to schedule Bone density and colon screening UTD Encouraged her to consume a balanced diet and exercise regimen Advised him to see an eye doctor and dentist annually Labs from 3 months ago reviewed  RTC in 1 year, sooner if needed Webb Silversmith, NP

## 2017-08-09 NOTE — Patient Instructions (Signed)
Health Maintenance for Postmenopausal Women Menopause is a normal process in which your reproductive ability comes to an end. This process happens gradually over a span of months to years, usually between the ages of 22 and 9. Menopause is complete when you have missed 12 consecutive menstrual periods. It is important to talk with your health care provider about some of the most common conditions that affect postmenopausal women, such as heart disease, cancer, and bone loss (osteoporosis). Adopting a healthy lifestyle and getting preventive care can help to promote your health and wellness. Those actions can also lower your chances of developing some of these common conditions. What should I know about menopause? During menopause, you may experience a number of symptoms, such as:  Moderate-to-severe hot flashes.  Night sweats.  Decrease in sex drive.  Mood swings.  Headaches.  Tiredness.  Irritability.  Memory problems.  Insomnia.  Choosing to treat or not to treat menopausal changes is an individual decision that you make with your health care provider. What should I know about hormone replacement therapy and supplements? Hormone therapy products are effective for treating symptoms that are associated with menopause, such as hot flashes and night sweats. Hormone replacement carries certain risks, especially as you become older. If you are thinking about using estrogen or estrogen with progestin treatments, discuss the benefits and risks with your health care provider. What should I know about heart disease and stroke? Heart disease, heart attack, and stroke become more likely as you age. This may be due, in part, to the hormonal changes that your body experiences during menopause. These can affect how your body processes dietary fats, triglycerides, and cholesterol. Heart attack and stroke are both medical emergencies. There are many things that you can do to help prevent heart disease  and stroke:  Have your blood pressure checked at least every 1-2 years. High blood pressure causes heart disease and increases the risk of stroke.  If you are 53-22 years old, ask your health care provider if you should take aspirin to prevent a heart attack or a stroke.  Do not use any tobacco products, including cigarettes, chewing tobacco, or electronic cigarettes. If you need help quitting, ask your health care provider.  It is important to eat a healthy diet and maintain a healthy weight. ? Be sure to include plenty of vegetables, fruits, low-fat dairy products, and lean protein. ? Avoid eating foods that are high in solid fats, added sugars, or salt (sodium).  Get regular exercise. This is one of the most important things that you can do for your health. ? Try to exercise for at least 150 minutes each week. The type of exercise that you do should increase your heart rate and make you sweat. This is known as moderate-intensity exercise. ? Try to do strengthening exercises at least twice each week. Do these in addition to the moderate-intensity exercise.  Know your numbers.Ask your health care provider to check your cholesterol and your blood glucose. Continue to have your blood tested as directed by your health care provider.  What should I know about cancer screening? There are several types of cancer. Take the following steps to reduce your risk and to catch any cancer development as early as possible. Breast Cancer  Practice breast self-awareness. ? This means understanding how your breasts normally appear and feel. ? It also means doing regular breast self-exams. Let your health care provider know about any changes, no matter how small.  If you are 40  or older, have a clinician do a breast exam (clinical breast exam or CBE) every year. Depending on your age, family history, and medical history, it may be recommended that you also have a yearly breast X-ray (mammogram).  If you  have a family history of breast cancer, talk with your health care provider about genetic screening.  If you are at high risk for breast cancer, talk with your health care provider about having an MRI and a mammogram every year.  Breast cancer (BRCA) gene test is recommended for women who have family members with BRCA-related cancers. Results of the assessment will determine the need for genetic counseling and BRCA1 and for BRCA2 testing. BRCA-related cancers include these types: ? Breast. This occurs in males or females. ? Ovarian. ? Tubal. This may also be called fallopian tube cancer. ? Cancer of the abdominal or pelvic lining (peritoneal cancer). ? Prostate. ? Pancreatic.  Cervical, Uterine, and Ovarian Cancer Your health care provider may recommend that you be screened regularly for cancer of the pelvic organs. These include your ovaries, uterus, and vagina. This screening involves a pelvic exam, which includes checking for microscopic changes to the surface of your cervix (Pap test).  For women ages 21-65, health care providers may recommend a pelvic exam and a Pap test every three years. For women ages 79-65, they may recommend the Pap test and pelvic exam, combined with testing for human papilloma virus (HPV), every five years. Some types of HPV increase your risk of cervical cancer. Testing for HPV may also be done on women of any age who have unclear Pap test results.  Other health care providers may not recommend any screening for nonpregnant women who are considered low risk for pelvic cancer and have no symptoms. Ask your health care provider if a screening pelvic exam is right for you.  If you have had past treatment for cervical cancer or a condition that could lead to cancer, you need Pap tests and screening for cancer for at least 20 years after your treatment. If Pap tests have been discontinued for you, your risk factors (such as having a new sexual partner) need to be  reassessed to determine if you should start having screenings again. Some women have medical problems that increase the chance of getting cervical cancer. In these cases, your health care provider may recommend that you have screening and Pap tests more often.  If you have a family history of uterine cancer or ovarian cancer, talk with your health care provider about genetic screening.  If you have vaginal bleeding after reaching menopause, tell your health care provider.  There are currently no reliable tests available to screen for ovarian cancer.  Lung Cancer Lung cancer screening is recommended for adults 69-62 years old who are at high risk for lung cancer because of a history of smoking. A yearly low-dose CT scan of the lungs is recommended if you:  Currently smoke.  Have a history of at least 30 pack-years of smoking and you currently smoke or have quit within the past 15 years. A pack-year is smoking an average of one pack of cigarettes per day for one year.  Yearly screening should:  Continue until it has been 15 years since you quit.  Stop if you develop a health problem that would prevent you from having lung cancer treatment.  Colorectal Cancer  This type of cancer can be detected and can often be prevented.  Routine colorectal cancer screening usually begins at  age 42 and continues through age 45.  If you have risk factors for colon cancer, your health care provider may recommend that you be screened at an earlier age.  If you have a family history of colorectal cancer, talk with your health care provider about genetic screening.  Your health care provider may also recommend using home test kits to check for hidden blood in your stool.  A small camera at the end of a tube can be used to examine your colon directly (sigmoidoscopy or colonoscopy). This is done to check for the earliest forms of colorectal cancer.  Direct examination of the colon should be repeated every  5-10 years until age 71. However, if early forms of precancerous polyps or small growths are found or if you have a family history or genetic risk for colorectal cancer, you may need to be screened more often.  Skin Cancer  Check your skin from head to toe regularly.  Monitor any moles. Be sure to tell your health care provider: ? About any new moles or changes in moles, especially if there is a change in a mole's shape or color. ? If you have a mole that is larger than the size of a pencil eraser.  If any of your family members has a history of skin cancer, especially at a young age, talk with your health care provider about genetic screening.  Always use sunscreen. Apply sunscreen liberally and repeatedly throughout the day.  Whenever you are outside, protect yourself by wearing long sleeves, pants, a wide-brimmed hat, and sunglasses.  What should I know about osteoporosis? Osteoporosis is a condition in which bone destruction happens more quickly than new bone creation. After menopause, you may be at an increased risk for osteoporosis. To help prevent osteoporosis or the bone fractures that can happen because of osteoporosis, the following is recommended:  If you are 46-71 years old, get at least 1,000 mg of calcium and at least 600 mg of vitamin D per day.  If you are older than age 55 but younger than age 65, get at least 1,200 mg of calcium and at least 600 mg of vitamin D per day.  If you are older than age 54, get at least 1,200 mg of calcium and at least 800 mg of vitamin D per day.  Smoking and excessive alcohol intake increase the risk of osteoporosis. Eat foods that are rich in calcium and vitamin D, and do weight-bearing exercises several times each week as directed by your health care provider. What should I know about how menopause affects my mental health? Depression may occur at any age, but it is more common as you become older. Common symptoms of depression  include:  Low or sad mood.  Changes in sleep patterns.  Changes in appetite or eating patterns.  Feeling an overall lack of motivation or enjoyment of activities that you previously enjoyed.  Frequent crying spells.  Talk with your health care provider if you think that you are experiencing depression. What should I know about immunizations? It is important that you get and maintain your immunizations. These include:  Tetanus, diphtheria, and pertussis (Tdap) booster vaccine.  Influenza every year before the flu season begins.  Pneumonia vaccine.  Shingles vaccine.  Your health care provider may also recommend other immunizations. This information is not intended to replace advice given to you by your health care provider. Make sure you discuss any questions you have with your health care provider. Document Released: 09/01/2005  Document Revised: 01/28/2016 Document Reviewed: 04/13/2015 Elsevier Interactive Patient Education  2018 [employer]  

## 2017-08-24 ENCOUNTER — Other Ambulatory Visit: Payer: Self-pay | Admitting: Internal Medicine

## 2017-09-27 ENCOUNTER — Other Ambulatory Visit: Payer: Self-pay | Admitting: Internal Medicine

## 2017-10-29 ENCOUNTER — Other Ambulatory Visit: Payer: Self-pay | Admitting: Family Medicine

## 2017-10-29 NOTE — Telephone Encounter (Signed)
Estrace last filled by Dr Damita Dunnings... Please advise if okay for pt to continue... Last OV CPE 07/2017.Marland KitchenMarland Kitchen

## 2018-03-08 ENCOUNTER — Other Ambulatory Visit: Payer: Self-pay | Admitting: Internal Medicine

## 2018-03-08 MED ORDER — ESTRADIOL 1 MG PO TABS: 1.5000 mg | ORAL_TABLET | Freq: Every day | ORAL | 0 refills | Status: DC

## 2018-04-30 ENCOUNTER — Other Ambulatory Visit: Payer: Self-pay | Admitting: Internal Medicine

## 2018-07-26 ENCOUNTER — Other Ambulatory Visit: Payer: Self-pay | Admitting: Internal Medicine

## 2018-08-28 ENCOUNTER — Other Ambulatory Visit: Payer: Self-pay | Admitting: Internal Medicine

## 2018-08-29 MED ORDER — ESTRADIOL 1 MG PO TABS: 1.5000 mg | ORAL_TABLET | Freq: Every day | ORAL | 0 refills | Status: DC

## 2018-08-29 MED ORDER — ESCITALOPRAM OXALATE 10 MG PO TABS: 10.0000 mg | ORAL_TABLET | Freq: Every day | ORAL | 0 refills | Status: DC

## 2018-09-05 ENCOUNTER — Encounter: Payer: Self-pay | Admitting: Internal Medicine

## 2018-09-07 MED ORDER — FLUCONAZOLE 150 MG PO TABS: 150.0000 mg | ORAL_TABLET | Freq: Once | ORAL | 1 refills | Status: AC

## 2018-09-20 ENCOUNTER — Other Ambulatory Visit: Payer: Self-pay | Admitting: Internal Medicine

## 2018-09-20 DIAGNOSIS — Z1231 Encounter for screening mammogram for malignant neoplasm of breast: Secondary | ICD-10-CM

## 2018-10-10 ENCOUNTER — Encounter: Payer: Self-pay | Admitting: Internal Medicine

## 2018-10-10 ENCOUNTER — Other Ambulatory Visit: Payer: Self-pay

## 2018-10-10 ENCOUNTER — Ambulatory Visit (INDEPENDENT_AMBULATORY_CARE_PROVIDER_SITE_OTHER): Payer: PPO | Admitting: Internal Medicine

## 2018-10-10 VITALS — BP 124/84 | HR 57 | Temp 97.9°F | Ht 64.0 in | Wt 181.0 lb

## 2018-10-10 DIAGNOSIS — M170 Bilateral primary osteoarthritis of knee: Secondary | ICD-10-CM | POA: Diagnosis not present

## 2018-10-10 DIAGNOSIS — Z114 Encounter for screening for human immunodeficiency virus [HIV]: Secondary | ICD-10-CM

## 2018-10-10 DIAGNOSIS — J4520 Mild intermittent asthma, uncomplicated: Secondary | ICD-10-CM | POA: Diagnosis not present

## 2018-10-10 DIAGNOSIS — Z1159 Encounter for screening for other viral diseases: Secondary | ICD-10-CM | POA: Diagnosis not present

## 2018-10-10 DIAGNOSIS — Z0000 Encounter for general adult medical examination without abnormal findings: Secondary | ICD-10-CM | POA: Diagnosis not present

## 2018-10-10 DIAGNOSIS — F329 Major depressive disorder, single episode, unspecified: Secondary | ICD-10-CM

## 2018-10-10 DIAGNOSIS — E7800 Pure hypercholesterolemia, unspecified: Secondary | ICD-10-CM

## 2018-10-10 DIAGNOSIS — F419 Anxiety disorder, unspecified: Secondary | ICD-10-CM | POA: Diagnosis not present

## 2018-10-10 DIAGNOSIS — K5090 Crohn's disease, unspecified, without complications: Secondary | ICD-10-CM

## 2018-10-10 DIAGNOSIS — K219 Gastro-esophageal reflux disease without esophagitis: Secondary | ICD-10-CM

## 2018-10-10 DIAGNOSIS — F32A Depression, unspecified: Secondary | ICD-10-CM

## 2018-10-10 LAB — LIPID PANEL
CHOL/HDL RATIO: 4
Cholesterol: 207 mg/dL — ABNORMAL HIGH (ref 0–200)
HDL: 55.9 mg/dL (ref >39.00)
LDL Cholesterol: 119 mg/dL — ABNORMAL HIGH (ref 0–99)
NONHDL: 150.86
Triglycerides: 158 mg/dL — ABNORMAL HIGH (ref 0.0–149.0)
VLDL: 31.6 mg/dL (ref 0.0–40.0)

## 2018-10-10 LAB — COMPREHENSIVE METABOLIC PANEL
ALT: 14 U/L (ref 0–35)
AST: 18 U/L (ref 0–37)
Albumin: 3.9 g/dL (ref 3.5–5.2)
Alkaline Phosphatase: 58 U/L (ref 39–117)
BILIRUBIN TOTAL: 0.5 mg/dL (ref 0.2–1.2)
BUN: 13 mg/dL (ref 6–23)
CO2: 30 meq/L (ref 19–32)
CREATININE: 0.68 mg/dL (ref 0.40–1.20)
Calcium: 9.2 mg/dL (ref 8.4–10.5)
Chloride: 101 mEq/L (ref 96–112)
GFR: 86.59 mL/min (ref >60.00)
GLUCOSE: 95 mg/dL (ref 70–99)
Potassium: 4 mEq/L (ref 3.5–5.1)
Sodium: 137 mEq/L (ref 135–145)
Total Protein: 6.7 g/dL (ref 6.0–8.3)

## 2018-10-10 LAB — VITAMIN D 25 HYDROXY (VIT D DEFICIENCY, FRACTURES): VITD: 53.01 ng/mL (ref 30.00–100.00)

## 2018-10-10 LAB — CBC
HCT: 39.7 % (ref 36.0–46.0)
Hemoglobin: 13.2 g/dL (ref 12.0–15.0)
MCHC: 33.2 g/dL (ref 30.0–36.0)
MCV: 91.5 fl (ref 78.0–100.0)
Platelets: 220 10*3/uL (ref 150.0–400.0)
RBC: 4.34 Mil/uL (ref 3.87–5.11)
RDW: 13.4 % (ref 11.5–15.5)
WBC: 8.9 10*3/uL (ref 4.0–10.5)

## 2018-10-10 MED ORDER — OMEPRAZOLE 20 MG PO CPDR: 20.0000 mg | DELAYED_RELEASE_CAPSULE | Freq: Every day | ORAL | 3 refills | Status: DC

## 2018-10-10 MED ORDER — ZOSTER VAC RECOMB ADJUVANTED 50 MCG/0.5ML IM SUSR: 0.5000 mL | Freq: Once | INTRAMUSCULAR | 0 refills | Status: AC

## 2018-10-10 NOTE — Assessment & Plan Note (Signed)
Improved with more activity and weight loss

## 2018-10-10 NOTE — Assessment & Plan Note (Signed)
In remission Will monitor

## 2018-10-10 NOTE — Assessment & Plan Note (Signed)
Continue Esciatlopram for now Support offered today

## 2018-10-10 NOTE — Assessment & Plan Note (Addendum)
Encouraged her to avoid foods that trigger reflux Discussed how weight loss could help improve reflux Continue Omeprazole as needed

## 2018-10-10 NOTE — Progress Notes (Addendum)
HPI:  Pt presents to the clinic today for her Welcome to Medicare Exam. She is also due to follow up chronic conditions.  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 She does not take any OTC medication for this.  Anxiety and Depression: She is taking Escitalopram as prescribed. She denies SI/HI.  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 She has a hiatal hernia. She does take Omeprazole as needed with good relief.  HLD: Her last LDL was 70, 04/2017. She is not taking Rosuvastatin as prescribed. She tries to consume a low fat diet.  Crohn's. In remission. She is not taking any medication for Crohn's at this time.  Past Medical History:  Diagnosis Date  . Allergy   . Allergy-induced asthma   . Arthritis   . Depression   . GERD (gastroesophageal reflux disease)   . History of chicken pox   . History of shingles   . Hyperlipidemia     Current Outpatient Medications  Medication Sig Dispense Refill  . albuterol (PROVENTIL HFA;VENTOLIN HFA) 108 (90 Base) MCG/ACT inhaler Inhale 1-2 puffs into the lungs every 6 (six) hours as needed for wheezing or shortness of breath.    Marland Kitchen aspirin (ECOTRIN LOW STRENGTH) 81 MG EC tablet Take 81 mg by mouth daily. Swallow whole.    . celecoxib (CELEBREX) 200 MG capsule TAKE 1 CAPSULE BY MOUTH EVERY DAY 30 capsule 10  . Cholecalciferol (VITAMIN D3) 50000 units CAPS Take one capsule by mouth twice weekly.  5  . escitalopram (LEXAPRO) 10 MG tablet Take 1 tablet (10 mg total) by mouth daily. MUST SCHEDULE ANNUAL PHYSICAL 90 tablet 0  . estradiol (ESTRACE) 1 MG tablet Take 1.5 tablets (1.5 mg total) by mouth daily. 135 tablet 0  . magnesium gluconate (MAGONATE) 500 MG tablet Take 500 mg by mouth daily.    . meclizine (ANTIVERT) 25 MG tablet Take 0.5-1 tablets (12.5-25 mg total) by mouth 3 (three) times daily as needed for dizziness. 30 tablet 0  . rosuvastatin  (CRESTOR) 10 MG tablet TAKE 1 TABLET BY MOUTH EVERY DAY 90 tablet 3   No current facility-administered medications for this visit.     Allergies  Allergen Reactions  . Codeine Itching    Family History  Problem Relation Age of Onset  . Diabetes Mother   . Atrial fibrillation Mother   . Arthritis Father   . Asthma Father   . Heart disease Father   . Hyperlipidemia Father   . Hypertension Father   . Kidney disease Father   . Diabetes Brother   . Hypertension Brother   . Depression Maternal Grandmother   . Heart disease Maternal Grandfather   . Hypertension Paternal Grandfather   . Stroke Paternal Grandfather     Social History   Socioeconomic History  . Marital status: Married    Spouse name: Not on file  . Number of children: Not on file  . Years of education: Not on file  . Highest education level: Not on file  Occupational History  . Not on file  Social Needs  . Financial resource strain: Not on file  . Food insecurity:    Worry: Not on file    Inability: Not on file  . Transportation needs:    Medical: Not on file    Non-medical: Not on file  Tobacco Use  . Smoking status: Never Smoker  . Smokeless tobacco:  Never Used  Substance and Sexual Activity  . Alcohol use: No  . Drug use: No  . Sexual activity: Yes  Lifestyle  . Physical activity:    Days per week: Not on file    Minutes per session: Not on file  . Stress: Not on file  Relationships  . Social connections:    Talks on phone: Not on file    Gets together: Not on file    Attends religious service: Not on file    Active member of club or organization: Not on file    Attends meetings of clubs or organizations: Not on file    Relationship status: Not on file  . Intimate partner violence:    Fear of current or ex partner: Not on file    Emotionally abused: Not on file    Physically abused: Not on file    Forced sexual activity: Not on file  Other Topics Concern  . Not on file  Social History  Narrative  . Not on file    Hospitiliaztions: None  Health Maintenance:    Flu: 03/2018  Tetanus: 2010  Pneumovax: never  Prevnar: 03/2018  Zostavax: never  Shingrix: never  Mammogram: scheduled 09/2018  Pap Smear: 2014, hysterectomy  Bone Density: 10/2015,   Colon Screening: 2017, 3 years  Eye Doctor: as needed  Dental Exam: biannually   Providers:   PCP: Webb Silversmith, NP-C  GI: None     I have personally reviewed and have noted:  1. The patient's medical and social history 2. Their use of alcohol, tobacco or illicit drugs 3. Their current medications and supplements 4. The patient's functional ability including ADL's, fall risks, home safety risks and hearing or visual impairment. 5. Diet and physical activities 6. Evidence for depression or mood disorder  Subjective:   Review of Systems:   Constitutional: Denies fever, malaise, fatigue, headache or abrupt weight changes.  HEENT: Denies eye pain, eye redness, ear pain, ringing in the ears, wax buildup, runny nose, nasal congestion, bloody nose, or sore throat. Respiratory: Denies difficulty breathing, shortness of breath, cough or sputum production.   Cardiovascular: Denies chest pain, chest tightness, palpitations or swelling in the hands or feet.  Gastrointestinal: Pt reports intermittent reflux. Denies abdominal pain, bloating, constipation, diarrhea or blood in the stool.  GU: Denies urgency, frequency, pain with urination, burning sensation, blood in urine, odor or discharge. Musculoskeletal: Denies decrease in range of motion, difficulty with gait, muscle pain or joint pain and swelling.  Skin: Denies redness, rashes, lesions or ulcercations.  Neurological: Denies dizziness, difficulty with memory, difficulty with speech or problems with balance and coordination.  Psych: Denies anxiety, depression, SI/HI.  No other specific complaints in a complete review of systems (except as listed in HPI  above).  Objective:  PE:  BP 124/84   Pulse (!) 57   Temp 97.9 F (36.6 C) (Oral)   Ht 5' 4"  (1.626 m)   Wt 181 lb (82.1 kg)   SpO2 97%   BMI 31.07 kg/m   Wt Readings from Last 3 Encounters:  08/09/17 188 lb 8 oz (85.5 kg)  08/02/17 190 lb 8 oz (86.4 kg)  04/26/17 190 lb (86.2 kg)    General: Appears her stated age, obese, in NAD. Skin: Warm, dry and intact.  HEENT: Head: normal shape and size; Eyes: sclera white, no icterus, conjunctiva pink, PERRLA and EOMs intact; Ears: Tm's gray and intact, normal light reflex; Throat/Mouth: Teeth present, mucosa pink and moist, no  exudate, lesions or ulcerations noted.  Neck: Neck supple, trachea midline. No masses, lumps or thyromegaly present.  Cardiovascular: Normal rate and rhythm. S1,S2 noted.  No murmur, rubs or gallops noted. No JVD or BLE edema. No carotid bruits noted. Pulmonary/Chest: Normal effort and positive vesicular breath sounds. No respiratory distress. No wheezes, rales or ronchi noted.  Abdomen: Soft and nontender. Normal bowel sounds. No distention or masses noted. Liver, spleen and kidneys non palpable. Musculoskeletal: Strength 5/5 BUE/BLE. No signs of joint swelling.  Neurological: Alert and oriented. Cranial nerves II-XII grossly intact. Coordination normal.  Psychiatric: Mood and affect normal. Behavior is normal. Judgment and thought content normal.     BMET    Component Value Date/Time   NA 138 04/26/2017 1149   NA 143 08/24/2015   K 3.8 04/26/2017 1149   CL 103 04/26/2017 1149   CO2 26 04/26/2017 1149   GLUCOSE 96 04/26/2017 1149   BUN 9 04/26/2017 1149   BUN 10 08/24/2015   CREATININE 0.60 04/26/2017 1149   CALCIUM 9.3 04/26/2017 1149    Lipid Panel     Component Value Date/Time   CHOL 152 04/26/2017 1149   TRIG 140.0 04/26/2017 1149   HDL 54.70 04/26/2017 1149   CHOLHDL 3 04/26/2017 1149   VLDL 28.0 04/26/2017 1149   LDLCALC 70 04/26/2017 1149    CBC    Component Value Date/Time   WBC  8.7 04/26/2017 1149   RBC 4.37 04/26/2017 1149   HGB 13.2 04/26/2017 1149   HCT 40.2 04/26/2017 1149   PLT 216.0 04/26/2017 1149   MCV 92.1 04/26/2017 1149   MCHC 32.8 04/26/2017 1149   RDW 12.6 04/26/2017 1149    Hgb A1C Lab Results  Component Value Date   HGBA1C 6.0 04/26/2017      Assessment and Plan:   Medicare Annual Wellness Visit:  Diet: She does eat meat. She consumes fruits and veggies daily. She tries to avoid fried foods. She drinks mostly coffee, water. Physical activity: Walking Depression/mood screen: Negative Hearing: Intact to whispered voice Visual acuity: Grossly norma ADLs: Capable Fall risk: None Home safety: Good Cognitive evaluation: Intact to orientation, naming, recall and repetition EOL planning: No adv directives, full code/ I agree  Preventative Medicine: Flu shot UTD. Advised her to get tetanus if she cuts herself, bitten by animal. Prevnar UTD. Pneumovax due 03/2018. RX for Shingrix sent to pharmacy. Mammogram scheduled. She no longer needs pap smears. Bone density UTD. Encouraged her to consume a balanced diet and exercise regimen. Advised her to see an eye doctor and dentist annually. Will check CBC, CMET, Lipid, Vit D, HIV and Hep C today.  Indication for ECG: Welcome to Medicare Interpretation: Normal rate, rhythm Comparison: 08/2015, unchanged  Next appointment: 6 months, follow up chronic conditions   Webb Silversmith, NP

## 2018-10-10 NOTE — Assessment & Plan Note (Signed)
Controlled on Zyrtec and Albuterol

## 2018-10-10 NOTE — Patient Instructions (Signed)
Health Maintenance for Postmenopausal Women Menopause is a normal process in which your reproductive ability comes to an end. This process happens gradually over a span of months to years, usually between the ages of 62 and 89. Menopause is complete when you have missed 12 consecutive menstrual periods. It is important to talk with your health care provider about some of the most common conditions that affect postmenopausal women, such as heart disease, cancer, and bone loss (osteoporosis). Adopting a healthy lifestyle and getting preventive care can help to promote your health and wellness. Those actions can also lower your chances of developing some of these common conditions. What should I know about menopause? During menopause, you may experience a number of symptoms, such as:  Moderate-to-severe hot flashes.  Night sweats.  Decrease in sex drive.  Mood swings.  Headaches.  Tiredness.  Irritability.  Memory problems.  Insomnia. Choosing to treat or not to treat menopausal changes is an individual decision that you make with your health care provider. What should I know about hormone replacement therapy and supplements? Hormone therapy products are effective for treating symptoms that are associated with menopause, such as hot flashes and night sweats. Hormone replacement carries certain risks, especially as you become older. If you are thinking about using estrogen or estrogen with progestin treatments, discuss the benefits and risks with your health care provider. What should I know about heart disease and stroke? Heart disease, heart attack, and stroke become more likely as you age. This may be due, in part, to the hormonal changes that your body experiences during menopause. These can affect how your body processes dietary fats, triglycerides, and cholesterol. Heart attack and stroke are both medical emergencies. There are many things that you can do to help prevent heart disease  and stroke:  Have your blood pressure checked at least every 1-2 years. High blood pressure causes heart disease and increases the risk of stroke.  If you are 79-72 years old, ask your health care provider if you should take aspirin to prevent a heart attack or a stroke.  Do not use any tobacco products, including cigarettes, chewing tobacco, or electronic cigarettes. If you need help quitting, ask your health care provider.  It is important to eat a healthy diet and maintain a healthy weight. ? Be sure to include plenty of vegetables, fruits, low-fat dairy products, and lean protein. ? Avoid eating foods that are high in solid fats, added sugars, or salt (sodium).  Get regular exercise. This is one of the most important things that you can do for your health. ? Try to exercise for at least 150 minutes each week. The type of exercise that you do should increase your heart rate and make you sweat. This is known as moderate-intensity exercise. ? Try to do strengthening exercises at least twice each week. Do these in addition to the moderate-intensity exercise.  Know your numbers.Ask your health care provider to check your cholesterol and your blood glucose. Continue to have your blood tested as directed by your health care provider.  What should I know about cancer screening? There are several types of cancer. Take the following steps to reduce your risk and to catch any cancer development as early as possible. Breast Cancer  Practice breast self-awareness. ? This means understanding how your breasts normally appear and feel. ? It also means doing regular breast self-exams. Let your health care provider know about any changes, no matter how small.  If you are 40 or  older, have a clinician do a breast exam (clinical breast exam or CBE) every year. Depending on your age, family history, and medical history, it may be recommended that you also have a yearly breast X-ray (mammogram).  If you  have a family history of breast cancer, talk with your health care provider about genetic screening.  If you are at high risk for breast cancer, talk with your health care provider about having an MRI and a mammogram every year.  Breast cancer (BRCA) gene test is recommended for women who have family members with BRCA-related cancers. Results of the assessment will determine the need for genetic counseling and BRCA1 and for BRCA2 testing. BRCA-related cancers include these types: ? Breast. This occurs in males or females. ? Ovarian. ? Tubal. This may also be called fallopian tube cancer. ? Cancer of the abdominal or pelvic lining (peritoneal cancer). ? Prostate. ? Pancreatic. Cervical, Uterine, and Ovarian Cancer Your health care provider may recommend that you be screened regularly for cancer of the pelvic organs. These include your ovaries, uterus, and vagina. This screening involves a pelvic exam, which includes checking for microscopic changes to the surface of your cervix (Pap test).  For women ages 21-65, health care providers may recommend a pelvic exam and a Pap test every three years. For women ages 39-65, they may recommend the Pap test and pelvic exam, combined with testing for human papilloma virus (HPV), every five years. Some types of HPV increase your risk of cervical cancer. Testing for HPV may also be done on women of any age who have unclear Pap test results.  Other health care providers may not recommend any screening for nonpregnant women who are considered low risk for pelvic cancer and have no symptoms. Ask your health care provider if a screening pelvic exam is right for you.  If you have had past treatment for cervical cancer or a condition that could lead to cancer, you need Pap tests and screening for cancer for at least 20 years after your treatment. If Pap tests have been discontinued for you, your risk factors (such as having a new sexual partner) need to be reassessed  to determine if you should start having screenings again. Some women have medical problems that increase the chance of getting cervical cancer. In these cases, your health care provider may recommend that you have screening and Pap tests more often.  If you have a family history of uterine cancer or ovarian cancer, talk with your health care provider about genetic screening.  If you have vaginal bleeding after reaching menopause, tell your health care provider.  There are currently no reliable tests available to screen for ovarian cancer. Lung Cancer Lung cancer screening is recommended for adults 57-50 years old who are at high risk for lung cancer because of a history of smoking. A yearly low-dose CT scan of the lungs is recommended if you:  Currently smoke.  Have a history of at least 30 pack-years of smoking and you currently smoke or have quit within the past 15 years. A pack-year is smoking an average of one pack of cigarettes per day for one year. Yearly screening should:  Continue until it has been 15 years since you quit.  Stop if you develop a health problem that would prevent you from having lung cancer treatment. Colorectal Cancer  This type of cancer can be detected and can often be prevented.  Routine colorectal cancer screening usually begins at age 12 and continues through  age 63.  If you have risk factors for colon cancer, your health care provider may recommend that you be screened at an earlier age.  If you have a family history of colorectal cancer, talk with your health care provider about genetic screening.  Your health care provider may also recommend using home test kits to check for hidden blood in your stool.  A small camera at the end of a tube can be used to examine your colon directly (sigmoidoscopy or colonoscopy). This is done to check for the earliest forms of colorectal cancer.  Direct examination of the colon should be repeated every 5-10 years until  age 75. However, if early forms of precancerous polyps or small growths are found or if you have a family history or genetic risk for colorectal cancer, you may need to be screened more often. Skin Cancer  Check your skin from head to toe regularly.  Monitor any moles. Be sure to tell your health care provider: ? About any new moles or changes in moles, especially if there is a change in a mole's shape or color. ? If you have a mole that is larger than the size of a pencil eraser.  If any of your family members has a history of skin cancer, especially at a young age, talk with your health care provider about genetic screening.  Always use sunscreen. Apply sunscreen liberally and repeatedly throughout the day.  Whenever you are outside, protect yourself by wearing long sleeves, pants, a wide-brimmed hat, and sunglasses. What should I know about osteoporosis? Osteoporosis is a condition in which bone destruction happens more quickly than new bone creation. After menopause, you may be at an increased risk for osteoporosis. To help prevent osteoporosis or the bone fractures that can happen because of osteoporosis, the following is recommended:  If you are 59-59 years old, get at least 1,000 mg of calcium and at least 600 mg of vitamin D per day.  If you are older than age 36 but younger than age 32, get at least 1,200 mg of calcium and at least 600 mg of vitamin D per day.  If you are older than age 47, get at least 1,200 mg of calcium and at least 800 mg of vitamin D per day. Smoking and excessive alcohol intake increase the risk of osteoporosis. Eat foods that are rich in calcium and vitamin D, and do weight-bearing exercises several times each week as directed by your health care provider. What should I know about how menopause affects my mental health? Depression may occur at any age, but it is more common as you become older. Common symptoms of depression include:  Low or sad mood.   Changes in sleep patterns.  Changes in appetite or eating patterns.  Feeling an overall lack of motivation or enjoyment of activities that you previously enjoyed.  Frequent crying spells. Talk with your health care provider if you think that you are experiencing depression. What should I know about immunizations? It is important that you get and maintain your immunizations. These include:  Tetanus, diphtheria, and pertussis (Tdap) booster vaccine.  Influenza every year before the flu season begins.  Pneumonia vaccine.  Shingles vaccine. Your health care provider may also recommend other immunizations. This information is not intended to replace advice given to you by your health care provider. Make sure you discuss any questions you have with your health care provider. Document Released: 09/01/2005 Document Revised: 01/28/2016 Document Reviewed: 04/13/2015 Elsevier Interactive Patient Education  2019 [place].

## 2018-10-10 NOTE — Assessment & Plan Note (Signed)
Will check CMET and Lipid profile today Off statin at this time

## 2018-10-11 ENCOUNTER — Encounter: Payer: Self-pay | Admitting: Internal Medicine

## 2018-10-11 LAB — HIV ANTIBODY (ROUTINE TESTING W REFLEX): HIV 1&2 Ab, 4th Generation: NONREACTIVE

## 2018-10-11 LAB — HEPATITIS C ANTIBODY
HEP C AB: NONREACTIVE
SIGNAL TO CUT-OFF: 0.01 (ref <1.00)

## 2018-10-11 MED ORDER — ALBUTEROL SULFATE HFA 108 (90 BASE) MCG/ACT IN AERS: 1.0000 | INHALATION_SPRAY | Freq: Four times a day (QID) | RESPIRATORY_TRACT | 1 refills | Status: DC | PRN

## 2018-10-15 ENCOUNTER — Encounter: Payer: Self-pay | Admitting: Internal Medicine

## 2018-10-18 ENCOUNTER — Ambulatory Visit: Payer: BLUE CROSS/BLUE SHIELD

## 2018-10-29 ENCOUNTER — Encounter: Payer: Self-pay | Admitting: Internal Medicine

## 2018-11-13 ENCOUNTER — Ambulatory Visit: Payer: PPO

## 2018-12-19 ENCOUNTER — Other Ambulatory Visit: Payer: Self-pay | Admitting: *Deleted

## 2018-12-19 NOTE — Patient Outreach (Signed)
Late entry- 10/21/18 Telephone call to patient for follow up on [place]. Spoke with pt, explained program, pt states her only needs are she would like to complete advanced directives,  RN CM mailed welcome letter to pt home with 24 hour nurse line magnet, pamphlet and Advanced directives packet.  Per Surveyor, quantity pt to be placed into engagement program.  PLAN Outreach pt in 2 weeks to ensure receipt of advanced directives packet  Jacqlyn Larsen [facility], [facility] Coordinator [phone]

## 2018-12-28 ENCOUNTER — Other Ambulatory Visit: Payer: Self-pay | Admitting: Internal Medicine

## 2019-01-02 ENCOUNTER — Other Ambulatory Visit: Payer: Self-pay | Admitting: Internal Medicine

## 2019-01-02 ENCOUNTER — Ambulatory Visit: Payer: Self-pay | Admitting: *Deleted

## 2019-01-03 ENCOUNTER — Other Ambulatory Visit: Payer: Self-pay | Admitting: Internal Medicine

## 2019-01-15 ENCOUNTER — Other Ambulatory Visit: Payer: Self-pay | Admitting: *Deleted

## 2019-01-15 NOTE — Patient Outreach (Signed)
Case closed due to pt enrolled in external program [employer]/ [facility],  RN CM faxed case closure letter to primary care provider.  Case closed  Jacqlyn Larsen [facility], [place] Coordinator [phone]

## 2019-01-25 ENCOUNTER — Other Ambulatory Visit: Payer: Self-pay | Admitting: Internal Medicine

## 2019-02-08 ENCOUNTER — Encounter: Payer: Self-pay | Admitting: Internal Medicine

## 2019-02-10 MED ORDER — ALPRAZOLAM 0.25 MG PO TABS: 0.2500 mg | ORAL_TABLET | Freq: Every day | ORAL | 0 refills | Status: DC | PRN

## 2019-03-12 ENCOUNTER — Other Ambulatory Visit: Payer: Self-pay

## 2019-03-12 ENCOUNTER — Ambulatory Visit
Admission: RE | Admit: 2019-03-12 | Discharge: 2019-03-12 | Disposition: A | Discharge status: Home / Self Care | Payer: PPO | Source: Ambulatory Visit | Attending: Internal Medicine | Admitting: Internal Medicine

## 2019-03-12 DIAGNOSIS — Z1231 Encounter for screening mammogram for malignant neoplasm of breast: Secondary | ICD-10-CM

## 2019-04-01 ENCOUNTER — Encounter: Payer: Self-pay | Admitting: Internal Medicine

## 2019-04-09 ENCOUNTER — Ambulatory Visit (INDEPENDENT_AMBULATORY_CARE_PROVIDER_SITE_OTHER): Payer: PPO | Admitting: Internal Medicine

## 2019-04-09 ENCOUNTER — Other Ambulatory Visit: Payer: Self-pay

## 2019-04-09 ENCOUNTER — Encounter: Payer: Self-pay | Admitting: Internal Medicine

## 2019-04-09 VITALS — BP 124/84 | HR 68 | Temp 98.3°F | Wt 183.0 lb

## 2019-04-09 DIAGNOSIS — F329 Major depressive disorder, single episode, unspecified: Secondary | ICD-10-CM | POA: Diagnosis not present

## 2019-04-09 DIAGNOSIS — F419 Anxiety disorder, unspecified: Secondary | ICD-10-CM | POA: Diagnosis not present

## 2019-04-09 DIAGNOSIS — R220 Localized swelling, mass and lump, head: Secondary | ICD-10-CM | POA: Diagnosis not present

## 2019-04-09 DIAGNOSIS — K148 Other diseases of tongue: Secondary | ICD-10-CM

## 2019-04-09 DIAGNOSIS — F32A Depression, unspecified: Secondary | ICD-10-CM

## 2019-04-09 MED ORDER — CLONAZEPAM 0.5 MG PO TABS: 0.5000 mg | ORAL_TABLET | Freq: Every evening | ORAL | 0 refills | Status: DC | PRN

## 2019-04-09 NOTE — Progress Notes (Signed)
Subjective:    Patient ID: Susan Norris, female    DOB: [birth year], 68 y.o.   MRN: [id number]  HPI  Pt presents to the clinic today with c/o tonsillar discomfort. She noticed this 2-3 weeks ago. She reports it is a sensation that something is stuck in her throat, not necessarily pain. She denies difficulty swallowing. She denies runny nose, nasal congestion, ear pain, cough or SOB. She denies reflux symptoms. She has not taken anything OTC for this. She has had a T&A 40 years ago at [facility]. She does not smoke or drink. She has no family history of oral cancer.  She also reports worsening anxiety due to COVID 19 and this new tongue issue. She is taking Lexapro and Xanax. She does not feel like the Xanax is effective and would like to know if she could take something different at bedtime. She has tried Lorazepam and Clonazepam in the past. Review of Systems      Past Medical History:  Diagnosis Date  . Allergy   . Allergy-induced asthma   . Arthritis   . Depression   . GERD (gastroesophageal reflux disease)   . History of chicken pox   . History of shingles   . Hyperlipidemia     Current Outpatient Medications  Medication Sig Dispense Refill  . albuterol (PROVENTIL HFA;VENTOLIN HFA) 108 (90 Base) MCG/ACT inhaler Inhale 1-2 puffs into the lungs every 6 (six) hours as needed for wheezing or shortness of breath. 18 g 1  . ALPRAZolam (XANAX) 0.25 MG tablet Take 1 tablet (0.25 mg total) by mouth daily as needed for anxiety. 20 tablet 0  . aspirin (ECOTRIN LOW STRENGTH) 81 MG EC tablet Take 81 mg by mouth daily. Swallow whole.    . Cholecalciferol (VITAMIN D3) 50000 units CAPS Take one capsule by mouth twice weekly.  5  . escitalopram (LEXAPRO) 10 MG tablet Take 1 tablet (10 mg total) by mouth daily. 90 tablet 2  . estradiol (ESTRACE) 1 MG tablet TAKE 1.5 TABLETS (1.5 MG TOTAL) BY MOUTH DAILY. 135 tablet 0  . loratadine (CLARITIN) 10 MG tablet Take 10 mg by mouth daily.    . magnesium  gluconate (MAGONATE) 500 MG tablet Take 500 mg by mouth daily.    Marland Kitchen omeprazole (PRILOSEC) 20 MG capsule Take 1 capsule (20 mg total) by mouth daily. 30 capsule 3  . rosuvastatin (CRESTOR) 10 MG tablet TAKE 1 TABLET BY MOUTH EVERY DAY (Patient not taking: Reported on 10/10/2018) 90 tablet 3   No current facility-administered medications for this visit.     Allergies  Allergen Reactions  . Codeine Itching    Family History  Problem Relation Age of Onset  . Diabetes Mother   . Atrial fibrillation Mother   . Arthritis Father   . Asthma Father   . Heart disease Father   . Hyperlipidemia Father   . Hypertension Father   . Kidney disease Father   . Diabetes Brother   . Hypertension Brother   . Depression Maternal Grandmother   . Heart disease Maternal Grandfather   . Hypertension Paternal Grandfather   . Stroke Paternal Grandfather   . Breast cancer Neg Hx     Social History   Socioeconomic History  . Marital status: Married    Spouse name: Not on file  . Number of children: Not on file  . Years of education: Not on file  . Highest education level: Not on file  Occupational History  . Not on  file  Social Needs  . Financial resource strain: Not on file  . Food insecurity    Worry: Not on file    Inability: Not on file  . Transportation needs    Medical: Not on file    Non-medical: Not on file  Tobacco Use  . Smoking status: Never Smoker  . Smokeless tobacco: Never Used  Substance and Sexual Activity  . Alcohol use: No  . Drug use: No  . Sexual activity: Yes  Lifestyle  . Physical activity    Days per week: Not on file    Minutes per session: Not on file  . Stress: Not on file  Relationships  . Social Herbalist on phone: Not on file    Gets together: Not on file    Attends religious service: Not on file    Active member of club or organization: Not on file    Attends meetings of clubs or organizations: Not on file    Relationship status: Not on  file  . Intimate partner violence    Fear of current or ex partner: Not on file    Emotionally abused: Not on file    Physically abused: Not on file    Forced sexual activity: Not on file  Other Topics Concern  . Not on file  Social History Narrative  . Not on file     Constitutional: Denies fever, malaise, fatigue, headache or abrupt weight changes.  HEENT: Denies eye pain, eye redness, ear pain, ringing in the ears, wax buildup, runny nose, nasal congestion, bloody nose, or sore throat. Respiratory: Denies difficulty breathing, shortness of breath, cough or sputum production.   Psych: Pt reports anxiety and depression. Denies SI/HI.  No other specific complaints in a complete review of systems (except as listed in HPI above).  Objective:   Physical Exam  BP 124/84   Pulse 68   Temp 98.3 F (36.8 C) (Temporal)   Wt 183 lb (83 kg)   SpO2 98%   BMI 31.41 kg/m  Wt Readings from Last 3 Encounters:  04/09/19 183 lb (83 kg)  10/10/18 181 lb (82.1 kg)  08/09/17 188 lb 8 oz (85.5 kg)    General: Appears her stated age, well developed, well nourished in NAD. HEENT: Throat/Mouth: Teeth present, mucosa pink and moist. Asymetrical proliferation of tissue noted at the base of the right side of the tongue.  Neck:  Neck supple, trachea midline. No masses, lumps or thyromegaly present.  Cardiovascular: Normal rate and rhythm. S1,S2 noted.  No murmur, rubs or gallops noted.  Pulmonary/Chest: Normal effort and positive vesicular breath sounds. No respiratory distress. No wheezes, rales or ronchi noted.  Neurological: Alert and oriented.  Psych: Anxious appearing. Tearful at times, concerned that she has cancer.  BMET    Component Value Date/Time   NA 137 10/10/2018 0938   NA 143 08/24/2015   K 4.0 10/10/2018 0938   CL 101 10/10/2018 0938   CO2 30 10/10/2018 0938   GLUCOSE 95 10/10/2018 0938   BUN 13 10/10/2018 0938   BUN 10 08/24/2015   CREATININE 0.68 10/10/2018 0938    CALCIUM 9.2 10/10/2018 0938    Lipid Panel     Component Value Date/Time   CHOL 207 (H) 10/10/2018 0938   TRIG 158.0 (H) 10/10/2018 0938   HDL 55.90 10/10/2018 0938   CHOLHDL 4 10/10/2018 0938   VLDL 31.6 10/10/2018 0938   LDLCALC 119 (H) 10/10/2018 0938    CBC  Component Value Date/Time   WBC 8.9 10/10/2018 0938   RBC 4.34 10/10/2018 0938   HGB 13.2 10/10/2018 0938   HCT 39.7 10/10/2018 0938   PLT 220.0 10/10/2018 0938   MCV 91.5 10/10/2018 0938   MCHC 33.2 10/10/2018 0938   RDW 13.4 10/10/2018 0938    Hgb A1C Lab Results  Component Value Date   HGBA1C 6.0 04/26/2017            Assessment & Plan:   Mass of Tongue:  Referral to ENT for further evaluation, possible scope and biopsy  Anxiety and Depression:  Continue Lexapro D/C Xanax RX for Clonazepam 0.5 mg daily prn  Return precautions discussed Webb Silversmith, NP

## 2019-04-09 NOTE — Patient Instructions (Signed)

## 2019-04-10 ENCOUNTER — Encounter: Payer: Self-pay | Admitting: Internal Medicine

## 2019-04-11 MED ORDER — LORAZEPAM 0.5 MG PO TABS: 0.5000 mg | ORAL_TABLET | Freq: Every day | ORAL | 0 refills | Status: DC | PRN

## 2019-04-22 DIAGNOSIS — D3702 Neoplasm of uncertain behavior of tongue: Secondary | ICD-10-CM | POA: Diagnosis not present

## 2019-04-22 DIAGNOSIS — H8111 Benign paroxysmal vertigo, right ear: Secondary | ICD-10-CM | POA: Diagnosis not present

## 2019-04-22 DIAGNOSIS — R42 Dizziness and giddiness: Secondary | ICD-10-CM | POA: Diagnosis not present

## 2019-07-10 ENCOUNTER — Ambulatory Visit: Payer: Self-pay

## 2019-07-10 NOTE — Telephone Encounter (Signed)
Patient calling stating that her daughter was exposed to someone who tested positive.  Patient was exposed to her daughter this past Tuesday.  Wanted to know when she should get tested.  inquired to Patient if she was having Sx.  Patient denies any Sx.  Related to Patient that CDC recommends that individuals have Sx, before testing. Patient voiced understanding.

## 2019-07-10 NOTE — Telephone Encounter (Signed)
Agree with advice given Webb Silversmith, NP

## 2019-07-14 ENCOUNTER — Other Ambulatory Visit: Payer: PPO

## 2019-07-14 ENCOUNTER — Ambulatory Visit: Payer: PPO | Attending: Internal Medicine

## 2019-07-14 DIAGNOSIS — Z20828 Contact with and (suspected) exposure to other viral communicable diseases: Secondary | ICD-10-CM | POA: Diagnosis not present

## 2019-07-14 DIAGNOSIS — Z20822 Contact with and (suspected) exposure to covid-19: Secondary | ICD-10-CM

## 2019-07-15 LAB — NOVEL CORONAVIRUS, NAA: SARS-CoV-2, NAA: NOT DETECTED

## 2019-08-14 DIAGNOSIS — D3702 Neoplasm of uncertain behavior of tongue: Secondary | ICD-10-CM | POA: Diagnosis not present

## 2019-08-14 DIAGNOSIS — H903 Sensorineural hearing loss, bilateral: Secondary | ICD-10-CM | POA: Diagnosis not present

## 2019-08-25 ENCOUNTER — Other Ambulatory Visit: Payer: Self-pay | Admitting: Internal Medicine

## 2019-08-26 ENCOUNTER — Encounter: Payer: Self-pay | Admitting: Internal Medicine

## 2019-08-26 NOTE — Telephone Encounter (Signed)
Last filled 01/06/2019 with no refills... please advise

## 2019-09-04 ENCOUNTER — Other Ambulatory Visit: Payer: Self-pay | Admitting: Internal Medicine

## 2019-09-04 NOTE — Telephone Encounter (Signed)
Was waiting on pt to respond to [facility] about whether she is still taking meds as it was last filled 12/2018 with no refills

## 2019-09-21 ENCOUNTER — Other Ambulatory Visit: Payer: Self-pay | Admitting: Internal Medicine

## 2019-09-22 NOTE — Telephone Encounter (Signed)
Pt says she has been taking even though it had not been filled since 12/2018... please advise if okay

## 2019-09-23 MED ORDER — ESTRADIOL 1 MG PO TABS: 1.5000 mg | ORAL_TABLET | Freq: Every day | ORAL | 0 refills | Status: DC

## 2019-10-02 ENCOUNTER — Telehealth: Payer: Self-pay | Admitting: Internal Medicine

## 2019-10-11 ENCOUNTER — Encounter: Payer: Self-pay | Admitting: Internal Medicine

## 2019-10-11 MED ORDER — MECLIZINE HCL 25 MG PO TABS: 25.0000 mg | ORAL_TABLET | Freq: Three times a day (TID) | ORAL | 2 refills | Status: AC | PRN

## 2019-10-11 NOTE — Addendum Note (Signed)
Addended by: Jearld Fenton on: 10/11/2019 06:50 PM   Modules accepted: Orders

## 2019-11-04 ENCOUNTER — Ambulatory Visit: Payer: PPO | Admitting: Internal Medicine

## 2019-11-12 DIAGNOSIS — R7303 Prediabetes: Secondary | ICD-10-CM | POA: Insufficient documentation

## 2019-11-12 DIAGNOSIS — R42 Dizziness and giddiness: Secondary | ICD-10-CM | POA: Insufficient documentation

## 2019-11-12 NOTE — Progress Notes (Signed)
Subjective:    Patient ID: Susan Norris, female    DOB: [birth year], 69 y.o.   MRN: [id number]  HPI The patient is here for follow up of their chronic medical problems, including Crohn's disease, GERD, anxiety, depression, allergy induced asthma, prediabetes  She is taking all of her medications as prescribed.   She is active, but does not exercising regularly.     Just saw ent - has lingual tonsil.    Medications and allergies reviewed with patient and updated if appropriate.  Patient Active Problem List   Diagnosis Date Noted  . Osteopenia 11/13/2019  . Vertigo 11/12/2019  . Prediabetes 11/12/2019  . Allergy-induced asthma 04/26/2017  . Anxiety and depression 04/26/2017  . Pure hypercholesterolemia 04/26/2017  . Osteoarthritis 04/26/2017  . GERD (gastroesophageal reflux disease) 04/26/2017  . Crohn disease ([place]) 04/26/2017    Current Outpatient Medications on File Prior to Visit  Medication Sig Dispense Refill  . albuterol (PROVENTIL HFA;VENTOLIN HFA) 108 (90 Base) MCG/ACT inhaler Inhale 1-2 puffs into the lungs every 6 (six) hours as needed for wheezing or shortness of breath. 18 g 1  . aspirin (ECOTRIN LOW STRENGTH) 81 MG EC tablet Take 81 mg by mouth daily. Swallow whole.    . Cholecalciferol (VITAMIN D3) 50000 units CAPS Take one capsule by mouth twice weekly.  5  . escitalopram (LEXAPRO) 10 MG tablet Take 1 tablet (10 mg total) by mouth daily. 90 tablet 2  . estradiol (ESTRACE) 1 MG tablet Take 1.5 tablets (1.5 mg total) by mouth daily. 135 tablet 0  . loratadine (CLARITIN) 10 MG tablet Take 10 mg by mouth daily.    Marland Kitchen LORazepam (ATIVAN) 0.5 MG tablet Take 1 tablet (0.5 mg total) by mouth daily as needed for anxiety. 30 tablet 0  . magnesium gluconate (MAGONATE) 500 MG tablet Take 500 mg by mouth daily.    . meclizine (ANTIVERT) 25 MG tablet Take 1 tablet (25 mg total) by mouth 3 (three) times daily as needed for dizziness. 30 tablet 2  . omeprazole (PRILOSEC) 20 MG  capsule Take 1 capsule (20 mg total) by mouth daily. (Patient taking differently: Take 20 mg by mouth as needed. ) 30 capsule 3   No current facility-administered medications on file prior to visit.    Past Medical History:  Diagnosis Date  . Allergy   . Allergy-induced asthma   . Arthritis   . Depression   . GERD (gastroesophageal reflux disease)   . History of chicken pox   . History of shingles   . Hyperlipidemia     Past Surgical History:  Procedure Laterality Date  . ABDOMINAL HYSTERECTOMY     total  . CESAREAN SECTION    . OOPHORECTOMY Left   . TONSILLECTOMY AND ADENOIDECTOMY      Social History   Socioeconomic History  . Marital status: Married    Spouse name: Not on file  . Number of children: Not on file  . Years of education: Not on file  . Highest education level: Not on file  Occupational History  . Not on file  Tobacco Use  . Smoking status: Never Smoker  . Smokeless tobacco: Never Used  Substance and Sexual Activity  . Alcohol use: No  . Drug use: No  . Sexual activity: Yes  Other Topics Concern  . Not on file  Social History Narrative  . Not on file   Social Determinants of Health   Financial Resource Strain:   . Difficulty  of Paying Living Expenses:   Food Insecurity:   . Worried About Charity fundraiser in the Last Year:   . Arboriculturist in the Last Year:   Transportation Needs:   . Film/video editor (Medical):   Marland Kitchen Lack of Transportation (Non-Medical):   Physical Activity:   . Days of Exercise per Week:   . Minutes of Exercise per Session:   Stress:   . Feeling of Stress :   Social Connections:   . Frequency of Communication with Friends and Family:   . Frequency of Social Gatherings with Friends and Family:   . Attends Religious Services:   . Active Member of Clubs or Organizations:   . Attends Archivist Meetings:   Marland Kitchen Marital Status:     Family History  Problem Relation Age of Onset  . Diabetes Mother   .  Atrial fibrillation Mother   . Arthritis Father   . Asthma Father   . Heart disease Father   . Hyperlipidemia Father   . Hypertension Father   . Kidney disease Father   . Diabetes Brother   . Hypertension Brother   . Depression Maternal Grandmother   . Heart disease Maternal Grandfather   . Hypertension Paternal Grandfather   . Stroke Paternal Grandfather   . Breast cancer Neg Hx     Review of Systems  Constitutional: Negative for chills and fever.  Eyes: Negative for visual disturbance.  Respiratory: Positive for cough (drainage). Negative for shortness of breath and wheezing.   Cardiovascular: Negative for chest pain, palpitations and leg swelling.  Gastrointestinal: Negative for abdominal pain, blood in stool, constipation, diarrhea (BM vary) and nausea.       GERD occ -   Genitourinary: Positive for frequency (at night). Negative for dysuria and hematuria.  Musculoskeletal: Positive for arthralgias (left hip, right knee OA).  Neurological: Positive for dizziness (occ). Negative for light-headedness and headaches.  Psychiatric/Behavioral: Positive for dysphoric mood. Negative for sleep disturbance. The patient is nervous/anxious (occ).        Objective:   Vitals:   11/13/19 1024  BP: 136/84  Pulse: (!) 59  Resp: 16  Temp: 97.9 F (36.6 C)  SpO2: 97%   BP Readings from Last 3 Encounters:  11/13/19 136/84  04/09/19 124/84  10/10/18 124/84   Wt Readings from Last 3 Encounters:  11/13/19 190 lb 12.8 oz (86.5 kg)  04/09/19 183 lb (83 kg)  10/10/18 181 lb (82.1 kg)   Body mass index is 32.75 kg/m.   Physical Exam    Constitutional: Appears well-developed and well-nourished. No distress.  HENT:  Head: Normocephalic and atraumatic.  Neck: Neck supple. No tracheal deviation present. No thyromegaly present.  No cervical lymphadenopathy Cardiovascular: Normal rate, regular rhythm and normal heart sounds.   No murmur heard. No carotid bruit .  No  edema Pulmonary/Chest: Effort normal and breath sounds normal. No respiratory distress. No has no wheezes. No rales.  Skin: Skin is warm and dry. Not diaphoretic.  Psychiatric: Normal mood and affect. Behavior is normal.      Assessment & Plan:    See Problem List for Assessment and Plan of chronic medical problems.    This visit occurred during the SARS-CoV-2 public health emergency.  Safety protocols were in place, including screening questions prior to the visit, additional usage of staff PPE, and extensive cleaning of exam room while observing appropriate contact time as indicated for disinfecting solutions.

## 2019-11-13 ENCOUNTER — Encounter: Payer: Self-pay | Admitting: Internal Medicine

## 2019-11-13 ENCOUNTER — Ambulatory Visit (INDEPENDENT_AMBULATORY_CARE_PROVIDER_SITE_OTHER): Payer: PPO | Admitting: Internal Medicine

## 2019-11-13 ENCOUNTER — Other Ambulatory Visit: Payer: Self-pay

## 2019-11-13 VITALS — BP 136/84 | HR 59 | Temp 97.9°F | Resp 16 | Ht 64.0 in | Wt 190.8 lb

## 2019-11-13 DIAGNOSIS — R931 Abnormal findings on diagnostic imaging of heart and coronary circulation: Secondary | ICD-10-CM

## 2019-11-13 DIAGNOSIS — R42 Dizziness and giddiness: Secondary | ICD-10-CM

## 2019-11-13 DIAGNOSIS — F419 Anxiety disorder, unspecified: Secondary | ICD-10-CM

## 2019-11-13 DIAGNOSIS — M8580 Other specified disorders of bone density and structure, unspecified site: Secondary | ICD-10-CM | POA: Insufficient documentation

## 2019-11-13 DIAGNOSIS — K5090 Crohn's disease, unspecified, without complications: Secondary | ICD-10-CM

## 2019-11-13 DIAGNOSIS — F329 Major depressive disorder, single episode, unspecified: Secondary | ICD-10-CM | POA: Diagnosis not present

## 2019-11-13 DIAGNOSIS — J4520 Mild intermittent asthma, uncomplicated: Secondary | ICD-10-CM | POA: Diagnosis not present

## 2019-11-13 DIAGNOSIS — M1711 Unilateral primary osteoarthritis, right knee: Secondary | ICD-10-CM | POA: Insufficient documentation

## 2019-11-13 DIAGNOSIS — K219 Gastro-esophageal reflux disease without esophagitis: Secondary | ICD-10-CM

## 2019-11-13 DIAGNOSIS — M85859 Other specified disorders of bone density and structure, unspecified thigh: Secondary | ICD-10-CM

## 2019-11-13 DIAGNOSIS — R7303 Prediabetes: Secondary | ICD-10-CM | POA: Diagnosis not present

## 2019-11-13 DIAGNOSIS — E7800 Pure hypercholesterolemia, unspecified: Secondary | ICD-10-CM | POA: Diagnosis not present

## 2019-11-13 DIAGNOSIS — F32A Depression, unspecified: Secondary | ICD-10-CM

## 2019-11-13 LAB — COMPREHENSIVE METABOLIC PANEL
ALT: 16 U/L (ref 0–35)
AST: 21 U/L (ref 0–37)
Albumin: 4.1 g/dL (ref 3.5–5.2)
Alkaline Phosphatase: 63 U/L (ref 39–117)
BUN: 11 mg/dL (ref 6–23)
CO2: 29 mEq/L (ref 19–32)
Calcium: 9.3 mg/dL (ref 8.4–10.5)
Chloride: 100 mEq/L (ref 96–112)
Creatinine, Ser: 0.64 mg/dL (ref 0.40–1.20)
GFR: 92.55 mL/min (ref >60.00)
Glucose, Bld: 97 mg/dL (ref 70–99)
Potassium: 4.3 mEq/L (ref 3.5–5.1)
Sodium: 137 mEq/L (ref 135–145)
Total Bilirubin: 0.4 mg/dL (ref 0.2–1.2)
Total Protein: 7.3 g/dL (ref 6.0–8.3)

## 2019-11-13 LAB — CBC WITH DIFFERENTIAL/PLATELET
Basophils Absolute: 0 10*3/uL (ref 0.0–0.1)
Basophils Relative: 0.4 % (ref 0.0–3.0)
Eosinophils Absolute: 0.2 10*3/uL (ref 0.0–0.7)
Eosinophils Relative: 2.2 % (ref 0.0–5.0)
HCT: 40.2 % (ref 36.0–46.0)
Hemoglobin: 13.2 g/dL (ref 12.0–15.0)
Lymphocytes Relative: 39.9 % (ref 12.0–46.0)
Lymphs Abs: 3.6 10*3/uL (ref 0.7–4.0)
MCHC: 32.9 g/dL (ref 30.0–36.0)
MCV: 92.1 fl (ref 78.0–100.0)
Monocytes Absolute: 0.8 10*3/uL (ref 0.1–1.0)
Monocytes Relative: 8.9 % (ref 3.0–12.0)
Neutro Abs: 4.3 10*3/uL (ref 1.4–7.7)
Neutrophils Relative %: 48.6 % (ref 43.0–77.0)
Platelets: 240 10*3/uL (ref 150.0–400.0)
RBC: 4.37 Mil/uL (ref 3.87–5.11)
RDW: 13.3 % (ref 11.5–15.5)
WBC: 8.9 10*3/uL (ref 4.0–10.5)

## 2019-11-13 LAB — TSH: TSH: 1.09 u[IU]/mL (ref 0.35–4.50)

## 2019-11-13 LAB — LIPID PANEL
Cholesterol: 239 mg/dL — ABNORMAL HIGH (ref 0–200)
HDL: 65 mg/dL (ref >39.00)
LDL Cholesterol: 146 mg/dL — ABNORMAL HIGH (ref 0–99)
NonHDL: 174.44
Total CHOL/HDL Ratio: 4
Triglycerides: 140 mg/dL (ref 0.0–149.0)
VLDL: 28 mg/dL (ref 0.0–40.0)

## 2019-11-13 LAB — HEMOGLOBIN A1C: Hgb A1c MFr Bld: 5.7 % (ref 4.6–6.5)

## 2019-11-13 NOTE — Assessment & Plan Note (Signed)
Chronic Check lipid panel  Should probably be on a statin 0 was on one in past - no side effects, just prefers no medication Regular exercise and healthy diet encouraged

## 2019-11-13 NOTE — Assessment & Plan Note (Signed)
Coronary calcium score of 180 in 2014 Was on a statin, but stopped it - would prefer not to be on one - did not have side effects Family history of CAD Should be on a statin Will get a coronary artery ct Consider restarting statin depending on results of above

## 2019-11-13 NOTE — Patient Instructions (Addendum)
  Blood work was ordered.     Medications reviewed and updated.  Changes include :   none  .  A Ct scan was ordered for your heart.     Please followup in 1 year

## 2019-11-13 NOTE — Assessment & Plan Note (Signed)
Chronic Controlled, stable Continue current dose of medication - lexapro 10 mg daily Takes ativan prn - does not take often

## 2019-11-13 NOTE — Assessment & Plan Note (Signed)
Chronic, occasional Sees ENT

## 2019-11-13 NOTE — Assessment & Plan Note (Addendum)
Chronic, intermittent Uses albuterol on occasion Takes an allergy medication daily

## 2019-11-13 NOTE — Assessment & Plan Note (Signed)
Chronic No symptoms  Due for a colonoscopy - last one was 4 years ago Referred to GI

## 2019-11-13 NOTE — Assessment & Plan Note (Signed)
Chronic gerd intermittent Take omeprazole prn

## 2019-11-13 NOTE — Assessment & Plan Note (Signed)
Chronic Check a1c Low sugar / carb diet Stressed regular exercise  

## 2019-11-13 NOTE — Assessment & Plan Note (Signed)
Chronic Due for dexa - ordered Encouraged increased activity Taking vitamin d

## 2019-11-14 ENCOUNTER — Encounter: Payer: Self-pay | Admitting: Internal Medicine

## 2019-11-14 ENCOUNTER — Encounter: Payer: Self-pay | Admitting: Gastroenterology

## 2019-11-27 ENCOUNTER — Other Ambulatory Visit: Payer: Self-pay | Admitting: Internal Medicine

## 2019-11-28 ENCOUNTER — Ambulatory Visit (INDEPENDENT_AMBULATORY_CARE_PROVIDER_SITE_OTHER)
Admission: RE | Admit: 2019-11-28 | Discharge: 2019-11-28 | Disposition: A | Discharge status: Home / Self Care | Payer: Self-pay | Source: Ambulatory Visit | Attending: Internal Medicine | Admitting: Internal Medicine

## 2019-11-28 ENCOUNTER — Other Ambulatory Visit: Payer: Self-pay

## 2019-11-28 DIAGNOSIS — E7800 Pure hypercholesterolemia, unspecified: Secondary | ICD-10-CM

## 2019-11-28 DIAGNOSIS — J8410 Pulmonary fibrosis, unspecified: Secondary | ICD-10-CM | POA: Diagnosis not present

## 2019-11-28 DIAGNOSIS — R931 Abnormal findings on diagnostic imaging of heart and coronary circulation: Secondary | ICD-10-CM

## 2019-12-02 ENCOUNTER — Encounter: Payer: Self-pay | Admitting: Internal Medicine

## 2019-12-02 DIAGNOSIS — K449 Diaphragmatic hernia without obstruction or gangrene: Secondary | ICD-10-CM | POA: Insufficient documentation

## 2019-12-15 ENCOUNTER — Other Ambulatory Visit: Payer: Self-pay | Admitting: Internal Medicine

## 2020-01-01 ENCOUNTER — Encounter: Payer: Self-pay | Admitting: Gastroenterology

## 2020-01-01 ENCOUNTER — Ambulatory Visit: Payer: PPO | Admitting: Gastroenterology

## 2020-01-01 VITALS — BP 118/70 | HR 75 | Ht 64.0 in | Wt 187.2 lb

## 2020-01-01 DIAGNOSIS — R1310 Dysphagia, unspecified: Secondary | ICD-10-CM

## 2020-01-01 DIAGNOSIS — K50819 Crohn's disease of both small and large intestine with unspecified complications: Secondary | ICD-10-CM

## 2020-01-01 MED ORDER — SUTAB 1479-225-188 MG PO TABS: 24.0000 | ORAL_TABLET | ORAL | 0 refills | Status: DC

## 2020-01-01 NOTE — Progress Notes (Signed)
Referring Provider: Binnie Rail, MD Primary Care Physician:  Binnie Rail, MD  Reason for Consultation:  Need for colonoscopy   IMPRESSION:  Crohn's    - initially diagnosed at ileitis    - managed with steroids intermittently    - patient declined Apriso, preferring to avoid medications    - no history of extra-GI manifestations of disease    - last colonoscopy 2017 showed distal colitis with biopsies showing acute colitis History of polyps GERD with known hiatal hernia    - EGD recommended given her intermittent dysphagia   PLAN: Obtain clinic records from prior GI in [place], [place] (Dr. Jarold Song) Colonoscopy EGD with possible dilatation  Please see the "Patient Instructions" section for addition details about the plan.  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 Quay Burow to establish care. The history is obtained through the patient and review of her electronic health record.   Crohn's ileitis diagnosed at age 89 when she presented with bleeding. Treated with steroids. But, then off therapy for many years.   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 Did not tolerate prednisone due to side effects.   Bowel habits alternate from 10 BM daily to constipation. No blood. No mucous. No abdominal pain.  Last prescribed Apriso by her gastroenterologist in [place],  but she didn't want to take four pills daily.  Does not take medications and she doesn't want to.    Last colonoscopy with Dr. Jarold Song in 2017 showed colitis from the anal verge to 30 cm. Biopsies showed chronic active colitis. Repeat colonoscopy recommended in 3 years.   Intermittent dysphagia to solids. Requesting an EGD in addition to her colonoscopy.  No extra GI manifestations of IBD.  Annual flu vaccine, Covid vaccine April/May Has a bone density test scheduled Mammogram in August Takes vitamin D  supplements No cigarettes or smokeless tobacco  No NSAIDs beyond an ASA 81 mg.   Labs 11/13/19 showed normal CMP, CBC, TSH, and HgbA1C. Albumin, hemoglobin, and platelets were normal.   Nervous stomach in multiple family members. Daughter with fibromyalgia. No known family history of autoimmune disease. No known family history of colon cancer or polyps. No family history of uterine/endometrial cancer, pancreatic cancer or gastric/stomach cancer.   Past Medical History:  Diagnosis Date  . Allergic rhinitis   . Allergy   . Allergy-induced asthma   . Anxiety   . Arthritis    knees   . Asthma   . CAD (coronary artery disease)   . Crohn's colitis ([place])   . Depression   . Fatty liver   . GERD (gastroesophageal reflux disease)   . History of chicken pox   . History of shingles   . HLD (hyperlipidemia)   . Hyperlipidemia   . Lumbar spondylosis   . Neoplasm of tongue   . Prediabetes   . Rectocele   . Sensorineural hearing loss   . Vertigo   . Vitamin D deficiency     Past Surgical History:  Procedure Laterality Date  . ABDOMINAL HYSTERECTOMY     total  . CESAREAN SECTION    . COLONOSCOPY  10/2015   completed in [place]   . OOPHORECTOMY Left   . TONSILLECTOMY AND ADENOIDECTOMY      Current Outpatient Medications  Medication Sig Dispense Refill  . albuterol (PROVENTIL HFA;VENTOLIN HFA) 108 (90 Base) MCG/ACT inhaler Inhale 1-2  puffs into the lungs every 6 (six) hours as needed for wheezing or shortness of breath. 18 g 1  . aspirin (ECOTRIN LOW STRENGTH) 81 MG EC tablet Take 81 mg by mouth daily. Swallow whole.    . Cholecalciferol (VITAMIN D3) 50000 units CAPS Take one capsule by mouth twice weekly.  5  . escitalopram (LEXAPRO) 10 MG tablet TAKE 1 TABLET BY MOUTH EVERY DAY 90 tablet 2  . estradiol (ESTRACE) 1 MG tablet TAKE 1.5 TABLETS (1.5 MG TOTAL) BY MOUTH DAILY. 135 tablet 0  . loratadine (CLARITIN) 10 MG tablet Take 10 mg by mouth daily as needed.     Marland Kitchen LORazepam (ATIVAN)  0.5 MG tablet Take 1 tablet (0.5 mg total) by mouth daily as needed for anxiety. 30 tablet 0  . magnesium gluconate (MAGONATE) 500 MG tablet Take 500 mg by mouth daily.    . meclizine (ANTIVERT) 25 MG tablet Take 1 tablet (25 mg total) by mouth 3 (three) times daily as needed for dizziness. 30 tablet 2  . omeprazole (PRILOSEC) 20 MG capsule Take 1 capsule (20 mg total) by mouth daily. (Patient taking differently: Take 20 mg by mouth as needed. ) 30 capsule 3  . rosuvastatin (CRESTOR) 10 MG tablet Take by mouth.     No current facility-administered medications for this visit.    Allergies as of 01/01/2020 - Review Complete 01/01/2020  Allergen Reaction Noted  . Codeine Itching 04/26/2017    Family History  Problem Relation Age of Onset  . Diabetes Mother   . Atrial fibrillation Mother   . Thyroid disease Mother   . Arthritis Father   . Asthma Father   . Heart disease Father   . Hyperlipidemia Father   . Hypertension Father   . Kidney disease Father   . Diabetes Brother   . Hypertension Brother   . Depression Maternal Grandmother   . Heart disease Maternal Grandfather   . Hypertension Paternal Grandfather   . Stroke Paternal Grandfather   . Breast cancer Neg Hx     Social History   Socioeconomic History  . Marital status: Married    Spouse name: Not on file  . Number of children: Not on file  . Years of education: Not on file  . Highest education level: Not on file  Occupational History  . Not on file  Tobacco Use  . Smoking status: Never Smoker  . Smokeless tobacco: Never Used  Substance and Sexual Activity  . Alcohol use: No  . Drug use: No  . Sexual activity: Yes  Other Topics Concern  . Not on file  Social History Narrative  . Not on file   Social Determinants of Health   Financial Resource Strain:   . Difficulty of Paying Living Expenses:   Food Insecurity:   . Worried About Charity fundraiser in the Last Year:   . Arboriculturist in the Last Year:     Transportation Needs:   . Film/video editor (Medical):   Marland Kitchen Lack of Transportation (Non-Medical):   Physical Activity:   . Days of Exercise per Week:   . Minutes of Exercise per Session:   Stress:   . Feeling of Stress :   Social Connections:   . Frequency of Communication with Friends and Family:   . Frequency of Social Gatherings with Friends and Family:   . Attends Religious Services:   . Active Member of Clubs or Organizations:   . Attends Archivist  Meetings:   Marland Kitchen Marital Status:   Intimate Partner Violence:   . Fear of Current or Ex-Partner:   . Emotionally Abused:   Marland Kitchen Physically Abused:   . Sexually Abused:     Review of Systems: 12 system ROS is negative except as noted above.   Physical Exam: General:   Alert,  well-nourished, pleasant and cooperative in NAD Head:  Normocephalic and atraumatic. Eyes:  Sclera clear, no icterus.   Conjunctiva pink. Ears:  Normal auditory acuity. Nose:  No deformity, discharge,  or lesions. Mouth:  No deformity or lesions.   Neck:  Supple; no masses or thyromegaly. Lungs:  Clear throughout to auscultation.   No wheezes. Heart:  Regular rate and rhythm; no murmurs. Abdomen:  Soft,nontender, nondistended, normal bowel sounds, no rebound or guarding. No hepatosplenomegaly.   Rectal:  Deferred  Msk:  Symmetrical. No boney deformities LAD: No inguinal or umbilical LAD Extremities:  No clubbing or edema. Neurologic:  Alert and  oriented x4;  grossly nonfocal Skin:  Intact without significant lesions or rashes. Psych:  Alert and cooperative. Normal mood and affect.    Krysti Hickling L. Tarri Glenn, MD, MPH 01/01/2020, 3:03 PM

## 2020-01-01 NOTE — Patient Instructions (Addendum)
If you are age 67 or older, your body mass index should be between 23-30. Your Body mass index is 32.13 kg/m. If this is out of the aforementioned range listed, please consider follow up with your Primary Care Provider.  If you are age 38 or younger, your body mass index should be between 19-25. Your Body mass index is 32.13 kg/m. If this is out of the aformentioned range listed, please consider follow up with your Primary Care Provider.   You have been scheduled for an endoscopy and colonoscopy. Please follow the written instructions given to you at your visit today. Please pick up your prep supplies at the pharmacy within the next 1-3 days. If you use inhalers (even only as needed), please bring them with you on the day of your procedure.  Tips for colonoscopy:  - Stay well hydrated for 3-4 days prior to the exam. This reduces nausea and dehydration.  - To prevent skin/hemorrhoid irritation - prior to wiping, put A&Dointment or vaseline on the toilet paper. - Keep a towel or pad on the bed.  - Drink  64oz of clear liquids in the morning of prep day (prior to starting the prep) to be sure that there is enough fluid to flush the colon and stay hydrated!!!! This is in addition to the fluids required for preparation. - Use of a flavored hard candy, such as grape Anise Salvo, can counteract some of the flavor of the prep and may prevent some nausea.   It was a pleasure to see you today!  Dr. Thornton Park

## 2020-01-27 ENCOUNTER — Other Ambulatory Visit: Payer: Self-pay | Admitting: Internal Medicine

## 2020-01-27 DIAGNOSIS — Z1231 Encounter for screening mammogram for malignant neoplasm of breast: Secondary | ICD-10-CM

## 2020-01-30 NOTE — Telephone Encounter (Signed)
Error

## 2020-02-18 ENCOUNTER — Encounter: Payer: Self-pay | Admitting: Internal Medicine

## 2020-02-18 DIAGNOSIS — G8929 Other chronic pain: Secondary | ICD-10-CM

## 2020-02-19 MED ORDER — CLONAZEPAM 0.5 MG PO TABS
0.5000 mg | ORAL_TABLET | Freq: Two times a day (BID) | ORAL | 0 refills | Status: DC | PRN
Start: 2020-02-19 — End: 2021-05-18

## 2020-02-19 MED ORDER — ROSUVASTATIN CALCIUM 10 MG PO TABS: 10.0000 mg | ORAL_TABLET | Freq: Every day | ORAL | 3 refills | Status: DC

## 2020-02-27 ENCOUNTER — Encounter: Payer: Self-pay | Admitting: Internal Medicine

## 2020-03-08 ENCOUNTER — Encounter: Payer: Self-pay | Admitting: Gastroenterology

## 2020-03-16 ENCOUNTER — Telehealth: Payer: Self-pay | Admitting: Gastroenterology

## 2020-03-16 NOTE — Telephone Encounter (Signed)
Called pt and asked whom she had spoken with regarding prep samples. Discussed with her I would need to check to see if we have samples. Will let pt know.  There were sutab samples, pts husband picked them up.

## 2020-03-18 ENCOUNTER — Other Ambulatory Visit: Payer: Self-pay | Admitting: Internal Medicine

## 2020-03-18 ENCOUNTER — Ambulatory Visit (AMBULATORY_SURGERY_CENTER): Payer: PPO | Admitting: Gastroenterology

## 2020-03-18 ENCOUNTER — Encounter: Payer: Self-pay | Admitting: Internal Medicine

## 2020-03-18 ENCOUNTER — Encounter: Payer: Self-pay | Admitting: Gastroenterology

## 2020-03-18 ENCOUNTER — Other Ambulatory Visit: Payer: Self-pay

## 2020-03-18 VITALS — BP 124/59 | HR 55 | Temp 98.8°F | Resp 11 | Ht 64.0 in | Wt 184.0 lb

## 2020-03-18 DIAGNOSIS — K648 Other hemorrhoids: Secondary | ICD-10-CM | POA: Diagnosis not present

## 2020-03-18 DIAGNOSIS — K2950 Unspecified chronic gastritis without bleeding: Secondary | ICD-10-CM | POA: Diagnosis not present

## 2020-03-18 DIAGNOSIS — K219 Gastro-esophageal reflux disease without esophagitis: Secondary | ICD-10-CM

## 2020-03-18 DIAGNOSIS — K50819 Crohn's disease of both small and large intestine with unspecified complications: Secondary | ICD-10-CM | POA: Diagnosis not present

## 2020-03-18 DIAGNOSIS — K449 Diaphragmatic hernia without obstruction or gangrene: Secondary | ICD-10-CM | POA: Diagnosis not present

## 2020-03-18 DIAGNOSIS — K2970 Gastritis, unspecified, without bleeding: Secondary | ICD-10-CM

## 2020-03-18 DIAGNOSIS — D120 Benign neoplasm of cecum: Secondary | ICD-10-CM | POA: Diagnosis not present

## 2020-03-18 DIAGNOSIS — K5090 Crohn's disease, unspecified, without complications: Secondary | ICD-10-CM | POA: Diagnosis not present

## 2020-03-18 DIAGNOSIS — D123 Benign neoplasm of transverse colon: Secondary | ICD-10-CM

## 2020-03-18 DIAGNOSIS — R1310 Dysphagia, unspecified: Secondary | ICD-10-CM | POA: Diagnosis not present

## 2020-03-18 DIAGNOSIS — Z86010 Personal history of colonic polyps: Secondary | ICD-10-CM

## 2020-03-18 DIAGNOSIS — Z1211 Encounter for screening for malignant neoplasm of colon: Secondary | ICD-10-CM | POA: Diagnosis not present

## 2020-03-18 MED ORDER — OMEPRAZOLE 40 MG PO CPDR: 40.0000 mg | DELAYED_RELEASE_CAPSULE | Freq: Every day | ORAL | 3 refills | Status: DC

## 2020-03-18 MED ORDER — SODIUM CHLORIDE 0.9 % IV SOLN: 500.0000 mL | Freq: Once | INTRAVENOUS | Status: DC

## 2020-03-18 NOTE — Op Note (Addendum)
[name] Patient Name: Susan Norris Procedure Date: 03/18/2020 10:05 AM MRN: [id number] Endoscopist: Thornton Park MD, MD Age: 69 Referring MD:  Date of Birth: May 13, 1951 Gender: Female Account #: [id number] Procedure:                Upper GI endoscopy Indications:              Dysphagia, Suspected gastro-esophageal reflux                            disease Medicines:                Monitored Anesthesia Care Procedure:                Pre-Anesthesia Assessment:                           - Prior to the procedure, a History and Physical                            was performed, and patient medications and                            allergies were reviewed. The patient's tolerance of                            previous anesthesia was also reviewed. The risks                            and benefits of the procedure and the sedation                            options and risks were discussed with the patient.                            All questions were answered, and informed consent                            was obtained. Prior Anticoagulants: The patient has                            taken no previous anticoagulant or antiplatelet                            agents. ASA Grade Assessment: III - A patient with                            severe systemic disease. After reviewing the risks                            and benefits, the patient was deemed in                            satisfactory condition to undergo the procedure.  After obtaining informed consent, the endoscope was                            passed under direct vision. Throughout the                            procedure, the patient's blood pressure, pulse, and                            oxygen saturations were monitored continuously. The                            Endoscope was introduced through the mouth, and                            advanced to the third part of duodenum. The upper                             GI endoscopy was accomplished without difficulty.                            The patient tolerated the procedure well. Scope In: Scope Out: Findings:                 The examined esophagus was normal. Biopsies were                            obtained from the mid/proximal and distal esophagus                            with cold forceps for histology.                           Patchy mild inflammation characterized by erythema,                            friability and granularity was found in the gastric                            body. Biopsies were taken from the antrum, body,                            and fundus with a cold forceps for histology.                            Estimated blood loss was minimal.                           A small hiatal hernia was present.                           The cardia and gastric fundus were normal on  retroflexion. Complications:            No immediate complications. Estimated blood loss:                            Minimal. Estimated Blood Loss:     Estimated blood loss was minimal. Impression:               - Normal esophagus. Biopsied.                           - Gastritis. Biopsied.                           - Small hiatal hernia. Recommendation:           - Patient has a contact number available for                            emergencies. The signs and symptoms of potential                            delayed complications were discussed with the                            patient. Return to normal activities tomorrow.                            Written discharge instructions were provided to the                            patient.                           - Resume previous diet.                           - Continue present medications. Start omeprazole to                            40 mg QAM for 12 weeks.                           - No aspirin, ibuprofen, naproxen, or other                             non-steroidal anti-inflammatory drugs.                           - Await pathology results.                           - Proceed with colonoscopy today as previously                            planned. Thornton Park MD, MD 03/18/2020 10:37:11 AM This report has been signed electronically.

## 2020-03-18 NOTE — Progress Notes (Signed)
VS-CW

## 2020-03-18 NOTE — Patient Instructions (Signed)
YOU HAD AN ENDOSCOPIC PROCEDURE TODAY AT THE [facility]:   Refer to the procedure report that was given to you for any specific questions about what was found during the examination.  If the procedure report does not answer your questions, please call your gastroenterologist to clarify.  If you requested that your care partner not be given the details of your procedure findings, then the procedure report has been included in a sealed envelope for you to review at your convenience later.  YOU SHOULD EXPECT: Some feelings of bloating in the abdomen. Passage of more gas than usual.  Walking can help get rid of the air that was put into your GI tract during the procedure and reduce the bloating. If you had a lower endoscopy (such as a colonoscopy or flexible sigmoidoscopy) you may notice spotting of blood in your stool or on the toilet paper. If you underwent a bowel prep for your procedure, you may not have a normal bowel movement for a few days.  Please Note:  You might notice some irritation and congestion in your nose or some drainage.  This is from the oxygen used during your procedure.  There is no need for concern and it should clear up in a day or so.  SYMPTOMS TO REPORT IMMEDIATELY:   Following lower endoscopy (colonoscopy or flexible sigmoidoscopy):  Excessive amounts of blood in the stool  Significant tenderness or worsening of abdominal pains  Swelling of the abdomen that is new, acute  Fever of 100F or higher   Following upper endoscopy (EGD)  Vomiting of blood or coffee ground material  New chest pain or pain under the shoulder blades  Painful or persistently difficult swallowing  New shortness of breath  Fever of 100F or higher  Black, tarry-looking stools  For urgent or emergent issues, a gastroenterologist can be reached at any hour by calling [phone]. Do not use MyChart messaging for urgent concerns.    DIET:  We do recommend a small meal at first, but  then you may proceed to your regular diet.  Drink plenty of fluids but you should avoid alcoholic beverages for 24 hours.  ACTIVITY:  You should plan to take it easy for the rest of today and you should NOT DRIVE or use heavy machinery until tomorrow (because of the sedation medicines used during the test).    FOLLOW UP: Our staff will call the number listed on your records 48-72 hours following your procedure to check on you and address any questions or concerns that you may have regarding the information given to you following your procedure. If we do not reach you, we will leave a message.  We will attempt to reach you two times.  During this call, we will ask if you have developed any symptoms of COVID 19. If you develop any symptoms (ie: fever, flu-like symptoms, shortness of breath, cough etc.) before then, please call [phone].  If you test positive for Covid 19 in the 2 weeks post procedure, please call and report this information to us.    If any biopsies were taken you will be contacted by phone or by letter within the next 1-3 weeks.  Please call us at [phone] if you have not heard about the biopsies in 3 weeks.    SIGNATURES/CONFIDENTIALITY: You and/or your care partner have signed paperwork which will be entered into your electronic medical record.  These signatures attest to the fact that that the information above on   your After Visit Summary has been reviewed and is understood.  Full responsibility of the confidentiality of this discharge information lies with you and/or your care-partner. 

## 2020-03-18 NOTE — Progress Notes (Signed)
robinol antisialogogue  Lidocaine   buffer

## 2020-03-18 NOTE — Progress Notes (Signed)
Called to room to assist during endoscopic procedure.  Patient ID and intended procedure confirmed with present staff. Received instructions for my participation in the procedure from the performing physician.  

## 2020-03-18 NOTE — Progress Notes (Signed)
A and O x3. Report to RN. Tolerated MAC anesthesia well.Teeth unchanged after procedure.

## 2020-03-18 NOTE — Op Note (Signed)
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 Deirdre Pippins in 2017 showed                            distal colitis from the anal verge to 30 cm. Repeat                            colonoscopy recommended in 3 years. Medicines:                Monitored Anesthesia Care Procedure:                Pre-Anesthesia Assessment:                           - Prior to the procedure, a History and Physical                            was performed, and patient medications and                            allergies were reviewed. The patient's tolerance of                            previous anesthesia was also reviewed. The risks                            and benefits of the procedure and the sedation                            options and risks were discussed with the patient.                            All questions were answered, and informed consent                            was obtained. Prior Anticoagulants: The patient has  taken no previous anticoagulant or antiplatelet                            agents. ASA Grade Assessment: III - A patient with                            severe systemic disease. After reviewing the risks                            and benefits, the patient was deemed in                            satisfactory condition to undergo the procedure.                           After obtaining informed consent, the  colonoscope                            was passed under direct vision. Throughout the                            procedure, the patient's blood pressure, pulse, and                            oxygen saturations were monitored continuously. The                            Colonoscope was introduced through the anus and                            advanced to the 10 cm into the ileum. The                            colonoscopy was performed without difficulty. The                            patient tolerated the procedure well. The quality                            of the bowel preparation was good. The terminal                            ileum, ileocecal valve, appendiceal orifice, and                            rectum were photographed. Scope In: 10:17:43 AM Scope Out: 10:31:58 AM Scope Withdrawal Time: 0 hours 12 minutes 31 seconds  Total Procedure Duration: 0 hours 14 minutes 15 seconds  Findings:                 The perianal and digital rectal examinations were                            normal except for small internal hemorrhoids.  The colon (entire examined portion) appeared normal                            including the rectum. Biopsies were taken with a                            cold forceps for histology. Estimated blood loss                            was minimal.                           A 4 mm polyp was found in the hepatic flexure. The                            polyp was flat. The polyp was removed with a cold                            snare. Resection and retrieval were complete.                            Estimated blood loss was minimal.                           A 2 mm polyp was found in the cecum. The polyp was                            flat. The polyp was removed with a cold snare.                            Resection and retrieval were complete. Estimated                            blood loss was minimal.                           The  terminal ileum appeared normal. Biopsies were                            taken with a cold forceps for histology. Estimated                            blood loss was minimal.                           The exam was otherwise without abnormality on                            direct and retroflexion views. Complications:            No immediate complications. Estimated blood loss:                            Minimal. Estimated Blood Loss:     Estimated  blood loss was minimal. Impression:               - The entire examined colon is normal. Biopsied.                           - Small internal hemorrhoids.                           - One 4 mm polyp at the hepatic flexure, removed                            with a cold snare. Resected and retrieved.                           - One 2 mm polyp in the cecum, removed with a cold                            snare. Resected and retrieved.                           - The examined portion of the ileum was normal.                            Biopsied.                           - The examination was otherwise normal on direct                            and retroflexion views. Recommendation:           - Patient has a contact number available for                            emergencies. The signs and symptoms of potential                            delayed complications were discussed with the                            patient. Return to normal activities tomorrow.                            Written discharge instructions were provided to the                            patient.                           - Resume previous diet.                           - Continue present medications.                           - Await pathology results.                           -  Repeat colonoscopy date to be determined after                            pending pathology results are reviewed for                            surveillance.                           - Emerging  evidence supports eating a diet of                            fruits, vegetables, grains, calcium, and yogurt                            while reducing red meat and alcohol may reduce the                            risk of colon cancer.                           - Thank you for allowing me to be involved in your                            colon cancer prevention. Thornton Park MD, MD 03/18/2020 10:43:33 AM This report has been signed electronically.

## 2020-03-19 ENCOUNTER — Encounter: Payer: Self-pay | Admitting: Internal Medicine

## 2020-03-22 ENCOUNTER — Telehealth: Payer: Self-pay | Admitting: *Deleted

## 2020-03-22 NOTE — Telephone Encounter (Signed)
  Follow up Call-  Call back number 03/18/2020  Post procedure Call Back phone  # [phone]  Permission to leave phone message Yes  Some recent data might be hidden     Patient questions:  Do you have a fever, pain , or abdominal swelling? No. Pain Score  0 *  Have you tolerated food without any problems? Yes.    Have you been able to return to your normal activities? Yes.    Do you have any questions about your discharge instructions: Diet   No. Medications  no Follow up visit  No.  Do you have questions or concerns about your Care? No.  Actions: * If pain score is 4 or above: No action needed, pain <4.  1. Have you developed a fever since your procedure? no  2.   Have you had an respiratory symptoms (SOB or cough) since your procedure? no  3.   Have you tested positive for COVID 19 since your procedure no  4.   Have you had any family members/close contacts diagnosed with the COVID 19 since your procedure?  no   If yes to any of these questions please route to Joylene John, RN and Joella Prince, RN

## 2020-03-22 NOTE — Telephone Encounter (Signed)
No answer for post procedure call back. Left message for patient to call with questions or concerns. 

## 2020-03-25 ENCOUNTER — Ambulatory Visit: Payer: PPO

## 2020-03-31 ENCOUNTER — Encounter: Payer: Self-pay | Admitting: Gastroenterology

## 2020-04-21 ENCOUNTER — Other Ambulatory Visit: Payer: Self-pay

## 2020-04-21 ENCOUNTER — Ambulatory Visit
Admission: RE | Admit: 2020-04-21 | Discharge: 2020-04-21 | Disposition: A | Discharge status: Home / Self Care | Payer: PPO | Source: Ambulatory Visit | Attending: Internal Medicine | Admitting: Internal Medicine

## 2020-04-21 DIAGNOSIS — M85859 Other specified disorders of bone density and structure, unspecified thigh: Secondary | ICD-10-CM

## 2020-04-21 DIAGNOSIS — Z1231 Encounter for screening mammogram for malignant neoplasm of breast: Secondary | ICD-10-CM

## 2020-04-21 DIAGNOSIS — M85852 Other specified disorders of bone density and structure, left thigh: Secondary | ICD-10-CM | POA: Diagnosis not present

## 2020-04-21 DIAGNOSIS — Z780 Asymptomatic menopausal state: Secondary | ICD-10-CM | POA: Diagnosis not present

## 2020-04-22 ENCOUNTER — Encounter: Payer: Self-pay | Admitting: Internal Medicine

## 2020-05-13 DIAGNOSIS — H26491 Other secondary cataract, right eye: Secondary | ICD-10-CM | POA: Diagnosis not present

## 2020-05-13 DIAGNOSIS — H524 Presbyopia: Secondary | ICD-10-CM | POA: Diagnosis not present

## 2020-05-25 ENCOUNTER — Encounter: Payer: Self-pay | Admitting: Internal Medicine

## 2020-05-25 DIAGNOSIS — L814 Other melanin hyperpigmentation: Secondary | ICD-10-CM | POA: Diagnosis not present

## 2020-05-25 DIAGNOSIS — D2362 Other benign neoplasm of skin of left upper limb, including shoulder: Secondary | ICD-10-CM | POA: Diagnosis not present

## 2020-05-25 DIAGNOSIS — D225 Melanocytic nevi of trunk: Secondary | ICD-10-CM | POA: Diagnosis not present

## 2020-05-25 DIAGNOSIS — D1801 Hemangioma of skin and subcutaneous tissue: Secondary | ICD-10-CM | POA: Diagnosis not present

## 2020-05-25 DIAGNOSIS — B351 Tinea unguium: Secondary | ICD-10-CM | POA: Diagnosis not present

## 2020-05-25 DIAGNOSIS — L821 Other seborrheic keratosis: Secondary | ICD-10-CM | POA: Diagnosis not present

## 2020-06-09 DIAGNOSIS — B351 Tinea unguium: Secondary | ICD-10-CM | POA: Diagnosis not present

## 2020-06-10 ENCOUNTER — Ambulatory Visit (INDEPENDENT_AMBULATORY_CARE_PROVIDER_SITE_OTHER): Payer: PPO

## 2020-06-10 ENCOUNTER — Ambulatory Visit: Payer: PPO | Admitting: Orthopaedic Surgery

## 2020-06-10 ENCOUNTER — Encounter: Payer: Self-pay | Admitting: Orthopaedic Surgery

## 2020-06-10 DIAGNOSIS — M1711 Unilateral primary osteoarthritis, right knee: Secondary | ICD-10-CM | POA: Diagnosis not present

## 2020-06-10 MED ORDER — METHYLPREDNISOLONE ACETATE 40 MG/ML IJ SUSP
40.0000 mg | INTRAMUSCULAR | Status: AC | PRN
  Administered 2020-06-10: 40 mg via INTRA_ARTICULAR

## 2020-06-10 MED ORDER — BUPIVACAINE HCL 0.5 % IJ SOLN
2.0000 mL | INTRAMUSCULAR | Status: AC | PRN
  Administered 2020-06-10: 2 mL via INTRA_ARTICULAR

## 2020-06-10 MED ORDER — LIDOCAINE HCL 1 % IJ SOLN
2.0000 mL | INTRAMUSCULAR | Status: AC | PRN
  Administered 2020-06-10: 2 mL

## 2020-06-10 NOTE — Progress Notes (Signed)
Office Visit Note   Patient: Susan Norris           Date of Birth: 02-May-1951           MRN: [id number] Visit Date: 06/10/2020              Requested by: Binnie Rail, MD [place],  [place] [address] PCP: Binnie Rail, MD   Assessment & Plan: Visit Diagnoses:  1. Primary osteoarthritis of right knee     Plan: Impression is advanced generative joint disease right knee.  We did discuss treatment options to include cortisone and viscosupplementation injections as well as total knee arthroplasty.  The patient is not ready for surgical intervention at this point.  Today, we proceeded with intra-articular cortisone injection to the right knee.  We have also provided her with a handout for viscosupplementation injection.  She will follow up with [place] as needed.  Follow-Up Instructions: Return if symptoms worsen or fail to improve.   Orders:  Orders Placed This Encounter  Procedures  . XR KNEE 3 VIEW RIGHT   No orders of the defined types were placed in this encounter.     Procedures: Large Joint Inj: R knee on 06/10/2020 10:57 AM Indications: pain Details: 22 G needle  Arthrogram: No  Medications: 40 mg methylPREDNISolone acetate 40 MG/ML; 2 mL lidocaine 1 %; 2 mL bupivacaine 0.5 % Consent was given by the patient. Patient was prepped and draped in the usual sterile fashion.       Clinical Data: No additional findings.   Subjective: Chief Complaint  Patient presents with  . Right Knee - Pain    HPI patient is a pleasant 69 year old female who comes in today with right knee pain for the past 8 to 9 years.  She does have a history of degenerative joint disease and has had a few cortisone injections which have significantly helped in the past.  Her last injection was in 2006.  The pain she is currently having is to the entire knee.  She does have associated locking catching.  No instability.  Her pain is aggravated going from a seated to standing position  as well as when she is walking for too long a period of time.  She does get mild relief of symptoms with rest and ice.  She does not take any medications for pain.  Review of Systems as detailed in HPI.  All others reviewed and are negative.   Objective: Vital Signs: There were no vitals taken for this visit.  Physical Exam well-developed well-nourished female no acute distress.  Alert and oriented x3.  Ortho Exam right knee exam shows a small effusion.  Range of motion 5 to 110 degrees.  Medial joint line tenderness.  Marked patellofemoral crepitus.  Ligaments are stable.  She is neurovascular intact distally.  Specialty Comments:  No specialty comments available.  Imaging: XR KNEE 3 VIEW RIGHT  Result Date: 06/10/2020 Advanced generative changes medial and patellofemoral compartments    PMFS History: Patient Active Problem List   Diagnosis Date Noted  . Hiatal hernia, moderate 12/02/2019  . Osteopenia 11/13/2019  . Osteoarthritis of right knee 11/13/2019  . 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 Vertigo 11/12/2019  . Prediabetes 11/12/2019  . Allergy-induced asthma 04/26/2017  . Anxiety and depression 04/26/2017  . Pure hypercholesterolemia 04/26/2017  . Osteoarthritis 04/26/2017  . GERD (gastroesophageal reflux disease) 04/26/2017  . Crohn disease ([place]) 04/26/2017  Past Medical History:  Diagnosis Date  . Allergic rhinitis   . Allergy   . Allergy-induced asthma   . Anxiety   . Arthritis    knees   . Asthma   . Blood transfusion without reported diagnosis    at 69yo  . CAD (coronary artery disease)   . Cataract    removed bilateral eyes  . Crohn's colitis ([place])   . Depression   . Fatty liver   . GERD (gastroesophageal reflux disease)   . History of chicken pox   . History of shingles   . HLD (hyperlipidemia)   . Hyperlipidemia   . Neoplasm of tongue   . Osteopenia   . Prediabetes   . Rectocele   . Sensorineural  hearing loss   . Vertigo   . Vitamin D deficiency     Family History  Problem Relation Age of Onset  . Diabetes Mother   . Atrial fibrillation Mother   . Thyroid disease Mother   . Arthritis Father   . Asthma Father   . Heart disease Father   . Hyperlipidemia Father   . Hypertension Father   . Kidney disease Father   . Diabetes Brother   . Hypertension Brother   . Depression Maternal Grandmother   . Heart disease Maternal Grandfather   . Hypertension Paternal Grandfather   . Stroke Paternal Grandfather   . Breast cancer Neg Hx   . Colon cancer Neg Hx   . Esophageal cancer Neg Hx   . Rectal cancer Neg Hx   . Stomach cancer Neg Hx     Past Surgical History:  Procedure Laterality Date  . ABDOMINAL HYSTERECTOMY     total  . CESAREAN SECTION    . COLONOSCOPY  10/2015   completed in [place]   . OOPHORECTOMY Left   . TONSILLECTOMY AND ADENOIDECTOMY    . UPPER GASTROINTESTINAL ENDOSCOPY     Social History   Occupational History  . Not on file  Tobacco Use  . Smoking status: Never Smoker  . Smokeless tobacco: Never Used  Vaping Use  . Vaping Use: Never used  Substance and Sexual Activity  . Alcohol use: No  . Drug use: No  . Sexual activity: Yes

## 2020-08-22 ENCOUNTER — Encounter: Payer: Self-pay | Admitting: Internal Medicine

## 2020-08-23 ENCOUNTER — Encounter: Payer: Self-pay | Admitting: Internal Medicine

## 2020-08-31 ENCOUNTER — Other Ambulatory Visit: Payer: Self-pay | Admitting: Internal Medicine

## 2020-09-15 ENCOUNTER — Other Ambulatory Visit: Payer: Self-pay | Admitting: Internal Medicine

## 2020-11-04 ENCOUNTER — Encounter: Payer: Self-pay | Admitting: Internal Medicine

## 2020-11-04 ENCOUNTER — Other Ambulatory Visit: Payer: Self-pay | Admitting: Internal Medicine

## 2020-11-04 MED ORDER — BENZONATATE 200 MG PO CAPS: 200.0000 mg | ORAL_CAPSULE | Freq: Three times a day (TID) | ORAL | 0 refills | Status: DC | PRN

## 2020-11-04 MED ORDER — DOXYCYCLINE HYCLATE 100 MG PO TABS: 100.0000 mg | ORAL_TABLET | Freq: Two times a day (BID) | ORAL | 0 refills | Status: DC

## 2020-11-04 MED ORDER — HYDROCODONE-HOMATROPINE 5-1.5 MG/5ML PO SYRP
5.0000 mL | ORAL_SOLUTION | Freq: Three times a day (TID) | ORAL | 0 refills | Status: DC | PRN
Start: 2020-11-04 — End: 2020-11-04

## 2020-11-04 MED ORDER — HYDROCODONE-HOMATROPINE 5-1.5 MG/5ML PO SYRP: 5.0000 mL | ORAL_SOLUTION | Freq: Three times a day (TID) | ORAL | 0 refills | Status: DC | PRN

## 2020-11-23 ENCOUNTER — Ambulatory Visit: Payer: PPO | Admitting: Orthopaedic Surgery

## 2020-11-23 ENCOUNTER — Other Ambulatory Visit: Payer: Self-pay

## 2020-11-23 ENCOUNTER — Encounter: Payer: Self-pay | Admitting: Orthopaedic Surgery

## 2020-11-23 VITALS — Ht 64.0 in | Wt 184.0 lb

## 2020-11-23 DIAGNOSIS — M1711 Unilateral primary osteoarthritis, right knee: Secondary | ICD-10-CM | POA: Diagnosis not present

## 2020-11-23 MED ORDER — METHYLPREDNISOLONE ACETATE 40 MG/ML IJ SUSP
40.0000 mg | INTRAMUSCULAR | Status: AC | PRN
  Administered 2020-11-23: 40 mg via INTRA_ARTICULAR

## 2020-11-23 MED ORDER — BUPIVACAINE HCL 0.5 % IJ SOLN
2.0000 mL | INTRAMUSCULAR | Status: AC | PRN
  Administered 2020-11-23: 2 mL via INTRA_ARTICULAR

## 2020-11-23 MED ORDER — LIDOCAINE HCL 1 % IJ SOLN
2.0000 mL | INTRAMUSCULAR | Status: AC | PRN
  Administered 2020-11-23: 2 mL

## 2020-11-23 NOTE — Progress Notes (Signed)
Office Visit Note   Patient: Susan Norris           Date of Birth: 04-11-1951           MRN: [id number] Visit Date: 11/23/2020              Requested by: Binnie Rail, MD [place],  [place] [address] PCP: Binnie Rail, MD   Assessment & Plan: Visit Diagnoses:  1. Primary osteoarthritis of right knee     Plan: My impression is that the knee pain is coming from either an new lateral meniscal tear or worsening of her degenerative joint disease.  Based on options so we agreed to proceed with a cortisone injection to see how she responds to this in the next couple weeks.  She will message me if the shot does not help so that we would then need to proceed with a MRI.  She tolerated the injection well today.  Follow-Up Instructions: Return if symptoms worsen or fail to improve.   Orders:  No orders of the defined types were placed in this encounter.  No orders of the defined types were placed in this encounter.     Procedures: Large Joint Inj: R knee on 11/23/2020 9:25 AM Indications: pain Details: 22 G needle  Arthrogram: No  Medications: 40 mg methylPREDNISolone acetate 40 MG/ML; 2 mL lidocaine 1 %; 2 mL bupivacaine 0.5 % Consent was given by the patient. Patient was prepped and draped in the usual sterile fashion.       Clinical Data: No additional findings.   Subjective: Chief Complaint  Patient presents with  . Right Knee - Pain    Susan Norris is a 70 year old female here for recent worsening of right knee pain.  Denies any injuries.  She experienced a couple episodes of locking since January.  She has been doing a lot of stairs in the process of moving and this has exacerbated her pain.  Previous cortisone injection in November helped for about 2 months.  She is requesting another injection today if possible.   Review of Systems  Constitutional: Negative.   HENT: Negative.   Eyes: Negative.   Respiratory: Negative.   Cardiovascular: Negative.    Endocrine: Negative.   Musculoskeletal: Negative.   Neurological: Negative.   Hematological: Negative.   Psychiatric/Behavioral: Negative.   All other systems reviewed and are negative.    Objective: Vital Signs: Ht 5' 4"  (1.626 m)   Wt 184 lb (83.5 kg)   BMI 31.58 kg/m   Physical Exam Vitals and nursing note reviewed.  Constitutional:      Appearance: She is well-developed.  Pulmonary:     Effort: Pulmonary effort is normal.  Skin:    General: Skin is warm.     Capillary Refill: Capillary refill takes less than 2 seconds.  Neurological:     Mental Status: She is alert and oriented to person, place, and time.  Psychiatric:        Behavior: Behavior normal.        Thought Content: Thought content normal.        Judgment: Judgment normal.     Ortho Exam Right knee shows no joint effusion.  She is unable to fully extend her knee secondary to pain.  No popliteal masses.  Collaterals and cruciates are stable.  Lateral joint line tenderness.  Negative McMurray.  Mild patellofemoral crepitus with range of motion. Specialty Comments:  No specialty comments available.  Imaging: No  results found.   PMFS History: Patient Active Problem List   Diagnosis Date Noted  . Hiatal hernia, moderate 12/02/2019  . Osteopenia 11/13/2019  . Osteoarthritis of right knee 11/13/2019  . 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 Vertigo 11/12/2019  . Prediabetes 11/12/2019  . Allergy-induced asthma 04/26/2017  . Anxiety and depression 04/26/2017  . Pure hypercholesterolemia 04/26/2017  . Osteoarthritis 04/26/2017  . GERD (gastroesophageal reflux disease) 04/26/2017  . Crohn disease ([place]) 04/26/2017   Past Medical History:  Diagnosis Date  . Allergic rhinitis   . Allergy   . Allergy-induced asthma   . Anxiety   . Arthritis    knees   . Asthma   . Blood transfusion without reported diagnosis    at 70yo  . CAD (coronary artery disease)   .  Cataract    removed bilateral eyes  . Crohn's colitis ([place])   . Depression   . Fatty liver   . GERD (gastroesophageal reflux disease)   . History of chicken pox   . History of shingles   . HLD (hyperlipidemia)   . Hyperlipidemia   . Neoplasm of tongue   . Osteopenia   . Prediabetes   . Rectocele   . Sensorineural hearing loss   . Vertigo   . Vitamin D deficiency     Family History  Problem Relation Age of Onset  . Diabetes Mother   . Atrial fibrillation Mother   . Thyroid disease Mother   . Arthritis Father   . Asthma Father   . Heart disease Father   . Hyperlipidemia Father   . Hypertension Father   . Kidney disease Father   . Diabetes Brother   . Hypertension Brother   . Depression Maternal Grandmother   . Heart disease Maternal Grandfather   . Hypertension Paternal Grandfather   . Stroke Paternal Grandfather   . Breast cancer Neg Hx   . Colon cancer Neg Hx   . Esophageal cancer Neg Hx   . Rectal cancer Neg Hx   . Stomach cancer Neg Hx     Past Surgical History:  Procedure Laterality Date  . ABDOMINAL HYSTERECTOMY     total  . CESAREAN SECTION    . COLONOSCOPY  10/2015   completed in    . OOPHORECTOMY Left   . TONSILLECTOMY AND ADENOIDECTOMY    . UPPER GASTROINTESTINAL ENDOSCOPY     Social History   Occupational History  . Not on file  Tobacco Use  . Smoking status: Never Smoker  . Smokeless tobacco: Never Used  Vaping Use  . Vaping Use: Never used  Substance and Sexual Activity  . Alcohol use: No  . Drug use: No  . Sexual activity: Yes

## 2020-11-25 ENCOUNTER — Ambulatory Visit: Payer: PPO | Admitting: Orthopaedic Surgery

## 2020-12-18 ENCOUNTER — Encounter: Payer: Self-pay | Admitting: Internal Medicine

## 2020-12-18 DIAGNOSIS — Z20822 Contact with and (suspected) exposure to covid-19: Secondary | ICD-10-CM | POA: Diagnosis not present

## 2020-12-20 ENCOUNTER — Encounter: Payer: Self-pay | Admitting: Internal Medicine

## 2020-12-21 ENCOUNTER — Telehealth (INDEPENDENT_AMBULATORY_CARE_PROVIDER_SITE_OTHER): Payer: PPO | Admitting: Internal Medicine

## 2020-12-21 ENCOUNTER — Other Ambulatory Visit: Payer: Self-pay

## 2020-12-21 ENCOUNTER — Encounter: Payer: Self-pay | Admitting: Internal Medicine

## 2020-12-21 ENCOUNTER — Other Ambulatory Visit: Payer: Self-pay | Admitting: Internal Medicine

## 2020-12-21 ENCOUNTER — Telehealth: Payer: PPO | Admitting: Physician Assistant

## 2020-12-21 DIAGNOSIS — U071 COVID-19: Secondary | ICD-10-CM

## 2020-12-21 HISTORY — DX: COVID-19: U07.1

## 2020-12-21 MED ORDER — AEROCHAMBER PLUS FLO-VU MEDIUM MISC: 1.0000 | Freq: Once | 0 refills | Status: AC

## 2020-12-21 MED ORDER — MOLNUPIRAVIR 200 MG PO CAPS: 800.0000 mg | ORAL_CAPSULE | Freq: Two times a day (BID) | ORAL | 0 refills | Status: AC

## 2020-12-21 MED ORDER — ALBUTEROL SULFATE HFA 108 (90 BASE) MCG/ACT IN AERS: 2.0000 | INHALATION_SPRAY | RESPIRATORY_TRACT | 0 refills | Status: AC | PRN

## 2020-12-21 MED ORDER — HYDROCODONE BIT-HOMATROP MBR 5-1.5 MG/5ML PO SOLN: 5.0000 mL | Freq: Three times a day (TID) | ORAL | 0 refills | Status: DC | PRN

## 2020-12-21 MED ORDER — FLUTICASONE PROPIONATE 50 MCG/ACT NA SUSP: 2.0000 | Freq: Every day | NASAL | 0 refills | Status: AC

## 2020-12-21 MED ORDER — BENZONATATE 100 MG PO CAPS: 100.0000 mg | ORAL_CAPSULE | Freq: Three times a day (TID) | ORAL | 0 refills | Status: DC | PRN

## 2020-12-21 NOTE — Assessment & Plan Note (Signed)
Acute Today is day 5 of her symptoms Had home COVID test become positive and symptoms typical for COVID Symptoms mild-moderate in nature and given her age she is high risk Discussed treatment options She is a candidate for Molnupiravir 800 mg twice daily x5 days Continue Tylenol as needed Will prescribe Tessalon Perles and Hycodan cough syrup, both of which worked well for her Discussed quarantining for 5 days and then masking for an additional 5 days Advised her to call if there is any questions or concerns or change in symptoms

## 2020-12-21 NOTE — Progress Notes (Signed)
E-Visit for Positive Covid Test Result We are sorry you are not feeling well. We are here to help!  You have tested positive for COVID-19, meaning that you were infected with the novel coronavirus and could give the virus to others.  It is vitally important that you stay home so you do not spread it to others.      Please continue isolation at home, for at least 10 days since the start of your symptoms and until you have had 24 hours with no fever (without taking a fever reducer) and with improving of symptoms.  If you have no symptoms but tested positive (or all symptoms resolve after 5 days and you have no fever) you can leave your house but continue to wear a mask around others for an additional 5 days. If you have a fever,continue to stay home until you have had 24 hours of no fever. Most cases improve 5-10 days from onset but we have seen a small number of patients who have gotten worse after the 10 days.  Please be sure to watch for worsening symptoms and remain taking the proper precautions.   Go to the nearest hospital ED for assessment if fever/cough/breathlessness are severe or illness seems like a threat to life.    The following symptoms may appear 2-14 days after exposure: . Fever . Cough . Shortness of breath or difficulty breathing . Chills . Repeated shaking with chills . Muscle pain . Headache . Sore throat . New loss of taste or smell . Fatigue . Congestion or runny nose . Nausea or vomiting . Diarrhea  You have been enrolled in [facility] for COVID-19. Daily you will receive a questionnaire within the [facility] website. Our COVID-19 response team will be monitoring your responses daily.  You can use medication such as prescription cough medication called Tessalon Perles 100 mg. You may take 1-2 capsules every 8 hours as needed for cough,  prescription inhaler called Albuterol MDI 90 mcg /actuation 2 puffs every 4 hours as needed for shortness of breath,  wheezing, cough and prescription for Fluticasone nasal spray 2 sprays in each nostril one time per day  You may also take acetaminophen (Tylenol) as needed for fever.  HOME CARE: . Only take medications as instructed by your medical team. . Drink plenty of fluids and get plenty of rest. . A steam or ultrasonic humidifier can help if you have congestion.   GET HELP RIGHT AWAY IF YOU HAVE EMERGENCY WARNING SIGNS.  Call 911 or proceed to your closest emergency facility if: . You develop worsening high fever. . Trouble breathing . Bluish lips or face . Persistent pain or pressure in the chest . New confusion . Inability to wake or stay awake . You cough up blood. . Your symptoms become more severe . Inability to hold down food or fluids  This list is not all possible symptoms. Contact your medical provider for any symptoms that are severe or concerning to you.    Your e-visit answers were reviewed by a board certified advanced clinical practitioner to complete your personal care plan.  Depending on the condition, your plan could have included both over the counter or prescription medications.  If there is a problem please reply once you have received a response from your provider.  Your safety is important to [place].  If you have drug allergies check your prescription carefully.    You can use MyChart to ask questions about today's visit, request a  non-urgent call back, or ask for a work or school excuse for 24 hours related to this e-Visit. If it has been greater than 24 hours you will need to follow up with your provider, or enter a new e-Visit to address those concerns. You will get an e-mail in the next two days asking about your experience.  I hope that your e-visit has been valuable and will speed your recovery. Thank you for using e-visits.     Greater than 5 minutes, yet less than 10 minutes of time have been spent researching, coordinating, and implementing care for this patient  today

## 2020-12-21 NOTE — Progress Notes (Signed)
Virtual Visit via Video Note  I connected with BERLIE PERSKY on 12/21/20 at  3:40 PM EDT by a video enabled telemedicine application and verified that I am speaking with the correct person using two identifiers.   I discussed the limitations of evaluation and management by telemedicine and the availability of in person appointments. The patient expressed understanding and agreed to proceed.  Present for the visit:  Myself, Dr Billey Gosling, Tyson Babinski.  The patient is currently at home and I am in the office.    No referring provider.    History of Present Illness: She is here for covid.   Her symptoms started Friday-today is day 5  She has been experiencing fever up to 103, nasal congestion, sneezing, loss of sense of smell, cough that is productive of white sputum, some body aches and headaches.  Its been a couple of days since she has had a fever.  She was most concerned about the cough that is productive of white sputum and was concerned if she was developing bronchitis and she needed an antibiotic.  She has tried taking Tylenol, Tessalon Perles she had leftover from a previous illness and Delsym cough syrup    Review of Systems  Constitutional: Positive for fever (Tmax 103 Sat).  HENT: Positive for congestion. Negative for sore throat.        Sneezing Loss sense of smell  Respiratory: Positive for cough and sputum production (white). Negative for shortness of breath and wheezing.   Musculoskeletal: Positive for myalgias.  Neurological: Positive for headaches.      Social History   Socioeconomic History  . Marital status: Married    Spouse name: Not on file  . Number of children: Not on file  . Years of education: Not on file  . Highest education level: Not on file  Occupational History  . Not on file  Tobacco Use  . Smoking status: Never Smoker  . Smokeless tobacco: Never Used  Vaping Use  . Vaping Use: Never used  Substance and Sexual Activity  . Alcohol use: No  .  Drug use: No  . Sexual activity: Yes  Other Topics Concern  . Not on file  Social History Narrative  . Not on file   Social Determinants of Health   Financial Resource Strain: Not on file  Food Insecurity: Not on file  Transportation Needs: Not on file  Physical Activity: Not on file  Stress: Not on file  Social Connections: Not on file     Observations/Objective: Appears well in NAD Breathing normally.  Intermittent coughing that sounds deep and bronchial Skin appears warm and dry  Assessment and Plan:  See Problem List for Assessment and Plan of chronic medical problems.   Follow Up Instructions:    I discussed the assessment and treatment plan with the patient. The patient was provided an opportunity to ask questions and all were answered. The patient agreed with the plan and demonstrated an understanding of the instructions.   The patient was advised to call back or seek an in-person evaluation if the symptoms worsen or if the condition fails to improve as anticipated.    Binnie Rail, MD

## 2020-12-22 ENCOUNTER — Encounter: Payer: Self-pay | Admitting: Internal Medicine

## 2020-12-23 ENCOUNTER — Encounter: Payer: Self-pay | Admitting: Internal Medicine

## 2020-12-23 MED ORDER — POLYMYXIN B-TRIMETHOPRIM 10000-0.1 UNIT/ML-% OP SOLN: 1.0000 [drp] | OPHTHALMIC | 0 refills | Status: DC

## 2021-02-18 ENCOUNTER — Encounter: Payer: Self-pay | Admitting: Internal Medicine

## 2021-02-19 MED ORDER — CEPHALEXIN 500 MG PO CAPS: 500.0000 mg | ORAL_CAPSULE | Freq: Two times a day (BID) | ORAL | 0 refills | Status: DC

## 2021-03-02 ENCOUNTER — Other Ambulatory Visit: Payer: Self-pay | Admitting: Internal Medicine

## 2021-03-13 ENCOUNTER — Other Ambulatory Visit: Payer: Self-pay | Admitting: Gastroenterology

## 2021-03-13 DIAGNOSIS — K2990 Gastroduodenitis, unspecified, without bleeding: Secondary | ICD-10-CM

## 2021-03-13 DIAGNOSIS — K219 Gastro-esophageal reflux disease without esophagitis: Secondary | ICD-10-CM

## 2021-03-13 DIAGNOSIS — K2970 Gastritis, unspecified, without bleeding: Secondary | ICD-10-CM

## 2021-03-24 ENCOUNTER — Encounter: Payer: PPO | Admitting: Internal Medicine

## 2021-04-04 DIAGNOSIS — D225 Melanocytic nevi of trunk: Secondary | ICD-10-CM | POA: Diagnosis not present

## 2021-04-04 DIAGNOSIS — D485 Neoplasm of uncertain behavior of skin: Secondary | ICD-10-CM | POA: Diagnosis not present

## 2021-04-04 DIAGNOSIS — L821 Other seborrheic keratosis: Secondary | ICD-10-CM | POA: Diagnosis not present

## 2021-04-18 ENCOUNTER — Ambulatory Visit: Payer: PPO | Admitting: *Deleted

## 2021-04-18 ENCOUNTER — Telehealth: Payer: Self-pay | Admitting: *Deleted

## 2021-04-18 NOTE — Telephone Encounter (Signed)
I connected with Mrs Vallely today around 6pm for her AWV. Pt declined visit at this time due to the loss of her father & having visitors over.  Pt asked that I notify you that her father is a patient of [facility] & she wanted me to let you know that he Reinaldo Raddle 07/27/2030) passed on 29-Apr-2021.

## 2021-04-18 NOTE — Progress Notes (Deleted)
Subjective:   Susan Norris is a 70 y.o. female who presents for Medicare Annual (Subsequent) preventive examination.  I connected with  Susan Norris on 04/18/21 by audio enabled telemedicine application and verified that I am speaking with the correct person using two identifiers.   I discussed the limitations of evaluation and management by telemedicine. The patient expressed understanding and agreed to proceed.   Location of Patient: Home Location of Provider: Office Persons participating in visit: Susan Norris (patient) & Jari Favre, CMA   Review of Systems    Defer to PCP       Objective:    There were no vitals filed for this visit. There is no height or weight on file to calculate BMI.  No flowsheet data found.  Current Medications (verified) Outpatient Encounter Medications as of 04/18/2021  Medication Sig   albuterol (VENTOLIN HFA) 108 (90 Base) MCG/ACT inhaler Inhale 2 puffs into the lungs every 2 (two) hours as needed for wheezing or shortness of breath (cough).   aspirin 81 MG EC tablet Take 81 mg by mouth daily. Swallow whole.   benzonatate (TESSALON PERLES) 100 MG capsule Take 1 capsule (100 mg total) by mouth 3 (three) times daily as needed for cough (cough).   cephALEXin (KEFLEX) 500 MG capsule Take 1 capsule (500 mg total) by mouth 2 (two) times daily.   Cholecalciferol (VITAMIN D3) 50000 units CAPS Take one capsule by mouth twice weekly.   clonazePAM (KLONOPIN) 0.5 MG tablet Take 1 tablet (0.5 mg total) by mouth 2 (two) times daily as needed for anxiety.   escitalopram (LEXAPRO) 10 MG tablet TAKE 1 TABLET BY MOUTH EVERY DAY   estradiol (ESTRACE) 1 MG tablet TAKE 1.5 TABLETS (1.5 MG TOTAL) BY MOUTH DAILY.   fexofenadine (ALLEGRA) 180 MG tablet Take by mouth.   fluticasone (FLONASE) 50 MCG/ACT nasal spray Place 2 sprays into both nostrils daily.   HYDROcodone bit-homatropine (HYCODAN) 5-1.5 MG/5ML syrup Take 5 mLs by mouth every 8 (eight) hours as needed for cough.    LORazepam (ATIVAN) 0.5 MG tablet Take 1 tablet (0.5 mg total) by mouth daily as needed for anxiety.   magnesium gluconate (MAGONATE) 500 MG tablet Take 500 mg by mouth daily.   meclizine (ANTIVERT) 25 MG tablet Take 1 tablet (25 mg total) by mouth 3 (three) times daily as needed for dizziness.   omeprazole (PRILOSEC) 40 MG capsule Take 1 capsule (40 mg total) by mouth daily. Take once daily in the AM for 12 weeks.   ranitidine (ZANTAC) 150 MG tablet Take by mouth.   rosuvastatin (CRESTOR) 10 MG tablet TAKE 1 TABLET BY MOUTH EVERY DAY   trimethoprim-polymyxin b (POLYTRIM) ophthalmic solution Place 1 drop into the right eye every 4 (four) hours.   No facility-administered encounter medications on file as of 04/18/2021.    Allergies (verified) Codeine   History: Past Medical History:  Diagnosis Date   Allergic rhinitis    Allergy    Allergy-induced asthma    Anxiety    Arthritis    knees    Asthma    Blood transfusion without reported diagnosis    at 70yo   CAD (coronary artery disease)    Cataract    removed bilateral eyes   Crohn's colitis ([place])    Depression    Fatty liver    GERD (gastroesophageal reflux disease)    History of chicken pox    History of shingles    HLD (hyperlipidemia)    Hyperlipidemia  Neoplasm of tongue    Osteopenia    Prediabetes    Rectocele    Sensorineural hearing loss    Vertigo    Vitamin D deficiency    Past Surgical History:  Procedure Laterality Date   ABDOMINAL HYSTERECTOMY     total   CESAREAN SECTION     COL[facility]COPY  10/2015   [facility]    OOPHORECTOMY Left    TONSILLECTOMY AND ADENOIDECTOMY     UPPER GASTROINTESTINAL ENDOSCOPY     Family History  Problem Relation Age of Onset   Diabetes Mother    Atrial fibrillation Mother    Thyroid disease Mother    Arthritis Father    Asthma Father    Heart disease Father    Hyperlipidemia Father    Hypertension Father    Kidney disease Father    Diabetes Brother     Hypertension Brother    Depression Maternal Grandmother    Heart disease Maternal Grandfather    Hypertension Paternal Grandfather    Stroke Paternal Grandfather    Breast cancer Neg Hx    Colon cancer Neg Hx    Esophageal cancer Neg Hx    Rectal cancer Neg Hx    Stomach cancer Neg Hx    Social History   Socioeconomic History   Marital status: Married    Spouse name: Not on file   Number of children: Not on file   Years of education: Not on file   Highest education level: Not on file  Occupational History   Not on file  Tobacco Use   Smoking status: Never   Smokeless tobacco: Never  Vaping Use   Vaping Use: Never used  Substance and Sexual Activity   Alcohol use: No   Drug use: No   Sexual activity: Yes  Other Topics Concern   Not on file  Social History Narrative   Not on file   Social Determinants of Health   Financial Resource Strain: Not on file  Food Insecurity: Not on file  Transportation Needs: Not on file  Physical Activity: Not on file  Stress: Not on file  Social Connections: Not on file    Tobacco Counseling Counseling given: Not Answered   Clinical Intake:                 Diabetic?***         Activities of Daily Living No flowsheet data found.  Patient Care Team: Binnie Rail, MD as PCP - General (Internal Medicine)  Indicate any recent Medical Services you may have received from other than [facility] providers in the past year (date may be approximate).     Assessment:   This is a routine wellness examination for Susan Norris.  Hearing/Vision screen No results found.  Dietary issues and exercise activities discussed:     Goals Addressed   None    Depression Screen PHQ 2/9 Scores 11/14/2019 10/10/2018 04/26/2017  PHQ - 2 Score 1 0 0  PHQ- 9 Score 5 0 -    Fall Risk Fall Risk  11/13/2019 10/10/2018 04/26/2017  Falls in the past year? 0 0 No  Number falls in past yr: 0 - -  Injury with Fall? 0 - -  Follow up Falls evaluation  completed - -    FALL RISK PREVENTION PERTAINING TO THE HOME:  Any stairs in or around the home? {YES/NO:21197} If so, are there any without handrails? {YES/NO:21197} Home free of loose throw rugs in walkways, pet beds,  electrical cords, etc? {YES/NO:21197} Adequate lighting in your home to reduce risk of falls? {YES/NO:21197}  ASSISTIVE DEVICES UTILIZED TO PREVENT FALLS:  Life alert? {YES/NO:21197} Use of a cane, walker or w/c? {YES/NO:21197} Grab bars in the bathroom? {YES/NO:21197} Shower chair or bench in shower? {YES/NO:21197} Elevated toilet seat or a handicapped toilet? {YES/NO:21197}  TIMED UP AND GO:  Was the test performed? {YES/NO:21197}.  Length of time to ambulate 10 feet: *** sec.   {Appearance of RUEA:5409811}  Cognitive Function:        Immunizations Immunization History  Administered Date(s) Administered   Fluad Quad(high Dose 65+) 03/29/2019   Influenza, High Dose Seasonal PF 03/31/2018   Influenza,inj,Quad PF,6+ Mos 04/26/2017   Influenza-Unspecified 03/31/2020   Moderna Sars-Covid-2 Vaccination 09/18/2019, 10/20/2019, 06/01/2020   Pneumococcal Conjugate-13 03/31/2018   Pneumococcal Polysaccharide-23 03/29/2019   Zoster Recombinat (Shingrix) 03/31/2020, 10/08/2020    {TDAP status:2101805}  {Flu Vaccine status:2101806}  {Pneumococcal vaccine status:2101807}  {Covid-19 vaccine status:2101808}  Qualifies for Shingles Vaccine? {YES/NO:21197}  Zostavax completed {YES/NO:21197}  {Shingrix Completed?:2101804}  Screening Tests Health Maintenance  Topic Date Due   TETANUS/TDAP  Never done   COVID-19 Vaccine (4 - Booster for Moderna series) 09/29/2020   INFLUENZA VACCINE  02/21/2021   MAMMOGRAM  04/21/2022   DEXA SCAN  04/22/2023   COLONOSCOPY (Pts 45-24yr Insurance coverage will need to be confirmed)  03/19/2027   Hepatitis C Screening  Completed   Zoster Vaccines- Shingrix  Completed   HPV VACCINES  Aged Out    Health  Maintenance  Health Maintenance Due  Topic Date Due   TETANUS/TDAP  Never done   COVID-19 Vaccine (4 - Booster for Moderna series) 09/29/2020   INFLUENZA VACCINE  02/21/2021    {Colorectal cancer screening:2101809}  {Mammogram status:21018020}  {Bone Density status:21018021}  Lung Cancer Screening: (Low Dose CT Chest recommended if Age 70-80 years, 30 pack-year currently smoking OR have quit w/in 15years.) {DOES NOT does:27190::"does not"} qualify.   Lung Cancer Screening Referral: ***  Additional Screening:  Hepatitis C Screening: {DOES NOT does:27190::"does not"} qualify; Completed ***  Vision Screening: Recommended annual ophthalmology exams for early detection of glaucoma and other disorders of the eye. Is the patient up to date with their annual eye exam?  {YES/NO:21197} Who is the provider or what is the name of the office in which the patient attends annual eye exams? *** If pt is not established with a provider, would they like to be referred to a provider to establish care? {YES/NO:21197}.   Dental Screening: Recommended annual dental exams for proper oral hygiene  Community Resource Referral / Chronic Care Management: CRR required this visit?  {YES/NO:21197}  CCM required this visit?  {YES/NO:21197}     Plan:     I have personally reviewed and noted the following in the patient's chart:   Medical and social history Use of alcohol, tobacco or illicit drugs  Current medications and supplements including opioid prescriptions.  Functional ability and status Nutritional status Physical activity Advanced directives List of other physicians Hospitalizations, surgeries, and ER visits in previous 12 months Vitals Screenings to include cognitive, depression, and falls Referrals and appointments  In addition, I have reviewed and discussed with patient certain preventive protocols, quality metrics, and best practice recommendations. A written personalized care  plan for preventive services as well as general preventive health recommendations were provided to patient.     LCannon Kettle [place]  04/18/2021   Nurse Notes: ***

## 2021-04-21 NOTE — Progress Notes (Signed)
Leeanne Rio, CMA    04/18/2021  6:47 PM Note I connected with Mrs Mothershead today around 6pm for her AWV. Pt declined visit at this time due to the loss of her father & having visitors over.   Pt asked that I notify you that her father is a patient of [facility] & she wanted me to let you know that he Reinaldo Raddle 08/06/2030) passed on 2021-05-09.

## 2021-05-12 ENCOUNTER — Ambulatory Visit (INDEPENDENT_AMBULATORY_CARE_PROVIDER_SITE_OTHER): Payer: PPO

## 2021-05-12 DIAGNOSIS — Z0000 Encounter for general adult medical examination without abnormal findings: Secondary | ICD-10-CM | POA: Diagnosis not present

## 2021-05-12 NOTE — Patient Instructions (Signed)
Ms. Summons , Thank you for taking time to come for your Medicare Wellness Visit. I appreciate your ongoing commitment to your health goals. Please review the following plan we discussed and let me know if I can assist you in the future.   Screening recommendations/referrals: Colonoscopy: 03/18/2020; due every 7 years Mammogram: 04/21/2020; due every year (scheduled for 05/2021) Bone Density: 04/21/2020; due every 3 years Recommended yearly ophthalmology/optometry visit for glaucoma screening and checkup Recommended yearly dental visit for hygiene and checkup  Vaccinations: Influenza vaccine: due Fall 2022 Pneumococcal vaccine: 03/31/2018, 03/29/2019 Tdap vaccine: never done Shingles vaccine: 03/31/2020, 10/08/2020   Covid-19: 09/18/2019, 10/20/2019, 06/01/2020  Advanced directives: Please bring a copy of your health care power of attorney and living will to the office at your convenience.  Conditions/risks identified: Yes; Client understands the importance of follow-up with providers by attending scheduled visits and discussed goals to eat healthier, increase physical activity, exercise the brain, socialize more, get enough sleep and make time for laughter.  Next appointment: Please schedule your next Medicare Wellness Visit with your Nurse Health Advisor in 1 year by calling [phone].   Preventive Care 8 Years and Older, Female Preventive care refers to lifestyle choices and visits with your health care provider that can promote health and wellness. What does preventive care include? A yearly physical exam. This is also called an annual well check. Dental exams once or twice a year. Routine eye exams. Ask your health care provider how often you should have your eyes checked. Personal lifestyle choices, including: Daily care of your teeth and gums. Regular physical activity. Eating a healthy diet. Avoiding tobacco and drug use. Limiting alcohol use. Practicing safe sex. Taking low-dose  aspirin every day. Taking vitamin and mineral supplements as recommended by your health care provider. What happens during an annual well check? The services and screenings done by your health care provider during your annual well check will depend on your age, overall health, lifestyle risk factors, and family history of disease. Counseling  Your health care provider may ask you questions about your: Alcohol use. Tobacco use. Drug use. Emotional well-being. Home and relationship well-being. Sexual activity. Eating habits. History of falls. Memory and ability to understand (cognition). Work and work Statistician. Reproductive health. Screening  You may have the following tests or measurements: Height, weight, and BMI. Blood pressure. Lipid and cholesterol levels. These may be checked every 5 years, or more frequently if you are over 69 years old. Skin check. Lung cancer screening. You may have this screening every year starting at age 46 if you have a 30-pack-year history of smoking and currently smoke or have quit within the past 15 years. Fecal occult blood test (FOBT) of the stool. You may have this test every year starting at age 87. Flexible sigmoidoscopy or colonoscopy. You may have a sigmoidoscopy every 5 years or a colonoscopy every 10 years starting at age 62. Hepatitis C blood test. Hepatitis B blood test. Sexually transmitted disease (STD) testing. Diabetes screening. This is done by checking your blood sugar (glucose) after you have not eaten for a while (fasting). You may have this done every 1-3 years. Bone density scan. This is done to screen for osteoporosis. You may have this done starting at age 46. Mammogram. This may be done every 1-2 years. Talk to your health care provider about how often you should have regular mammograms. Talk with your health care provider about your test results, treatment options, and if necessary, the need for  more tests. Vaccines  Your  health care provider may recommend certain vaccines, such as: Influenza vaccine. This is recommended every year. Tetanus, diphtheria, and acellular pertussis (Tdap, Td) vaccine. You may need a Td booster every 10 years. Zoster vaccine. You may need this after age 35. Pneumococcal 13-valent conjugate (PCV13) vaccine. One dose is recommended after age 8. Pneumococcal polysaccharide (PPSV23) vaccine. One dose is recommended after age 87. Talk to your health care provider about which screenings and vaccines you need and how often you need them. This information is not intended to replace advice given to you by your health care provider. Make sure you discuss any questions you have with your health care provider. Document Released: 08/06/2015 Document Revised: 03/29/2016 Document Reviewed: 05/11/2015 Elsevier Interactive Patient Education  2017 [place] Prevention in the Home Falls can cause injuries. They can happen to people of all ages. There are many things you can do to make your home safe and to help prevent falls. What can I do on the outside of my home? Regularly fix the edges of walkways and driveways and fix any cracks. Remove anything that might make you trip as you walk through a door, such as a raised step or threshold. Trim any bushes or trees on the path to your home. Use bright outdoor lighting. Clear any walking paths of anything that might make someone trip, such as rocks or tools. Regularly check to see if handrails are loose or broken. Make sure that both sides of any steps have handrails. Any raised decks and porches should have guardrails on the edges. Have any leaves, snow, or ice cleared regularly. Use sand or salt on walking paths during winter. Clean up any spills in your garage right away. This includes oil or grease spills. What can I do in the bathroom? Use night lights. Install grab bars by the toilet and in the tub and shower. Do not use towel bars as  grab bars. Use non-skid mats or decals in the tub or shower. If you need to sit down in the shower, use a plastic, non-slip stool. Keep the floor dry. Clean up any water that spills on the floor as soon as it happens. Remove soap buildup in the tub or shower regularly. Attach bath mats securely with double-sided non-slip rug tape. Do not have throw rugs and other things on the floor that can make you trip. What can I do in the bedroom? Use night lights. Make sure that you have a light by your bed that is easy to reach. Do not use any sheets or blankets that are too big for your bed. They should not hang down onto the floor. Have a firm chair that has side arms. You can use this for support while you get dressed. Do not have throw rugs and other things on the floor that can make you trip. What can I do in the kitchen? Clean up any spills right away. Avoid walking on wet floors. Keep items that you use a lot in easy-to-reach places. If you need to reach something above you, use a strong step stool that has a grab bar. Keep electrical cords out of the way. Do not use floor polish or wax that makes floors slippery. If you must use wax, use non-skid floor wax. Do not have throw rugs and other things on the floor that can make you trip. What can I do with my stairs? Do not leave any items on the stairs. Make  sure that there are handrails on both sides of the stairs and use them. Fix handrails that are broken or loose. Make sure that handrails are as long as the stairways. Check any carpeting to make sure that it is firmly attached to the stairs. Fix any carpet that is loose or worn. Avoid having throw rugs at the top or bottom of the stairs. If you do have throw rugs, attach them to the floor with carpet tape. Make sure that you have a light switch at the top of the stairs and the bottom of the stairs. If you do not have them, ask someone to add them for you. What else can I do to help prevent  falls? Wear shoes that: Do not have high heels. Have rubber bottoms. Are comfortable and fit you well. Are closed at the toe. Do not wear sandals. If you use a stepladder: Make sure that it is fully opened. Do not climb a closed stepladder. Make sure that both sides of the stepladder are locked into place. Ask someone to hold it for you, if possible. Clearly mark and make sure that you can see: Any grab bars or handrails. First and last steps. Where the edge of each step is. Use tools that help you move around (mobility aids) if they are needed. These include: Canes. Walkers. Scooters. Crutches. Turn on the lights when you go into a dark area. Replace any light bulbs as soon as they burn out. Set up your furniture so you have a clear path. Avoid moving your furniture around. If any of your floors are uneven, fix them. If there are any pets around you, be aware of where they are. Review your medicines with your doctor. Some medicines can make you feel dizzy. This can increase your chance of falling. Ask your doctor what other things that you can do to help prevent falls. This information is not intended to replace advice given to you by your health care provider. Make sure you discuss any questions you have with your health care provider. Document Released: 05/06/2009 Document Revised: 12/16/2015 Document Reviewed: 08/14/2014 Elsevier Interactive Patient Education  2017 [employer].

## 2021-05-12 NOTE — Progress Notes (Signed)
I connected with Susan Norris today by telephone and verified that I am speaking with the correct person using two identifiers. Location patient: home Location provider: work Persons participating in the virtual visit: patient, provider.   I discussed the limitations, risks, security and privacy concerns of performing an evaluation and management service by telephone and the availability of in person appointments. I also discussed with the patient that there may be a patient responsible charge related to this service. The patient expressed understanding and verbally consented to this telephonic visit.    Interactive audio and video telecommunications were attempted between this provider and patient, however failed, due to patient having technical difficulties OR patient did not have access to video capability.  We continued and completed visit with audio only.  Some vital signs may be absent or patient reported.   Time Spent with patient on telephone encounter: 40 minutes  Subjective:   Susan Norris is a 70 y.o. female who presents for Medicare Annual (Subsequent) preventive examination.  Review of Systems     Cardiac Risk Factors include: advanced age (>47mn, >>31women);dyslipidemia;family history of premature cardiovascular disease     Objective:    There were no vitals filed for this visit. There is no height or weight on file to calculate BMI.  Advanced Directives 05/12/2021  Does Patient Have a Medical Advance Directive? Yes  Type of Advance Directive Living will;Healthcare Power of Attorney  Does patient want to make changes to medical advance directive? No - Patient declined  Copy of [place]? No - copy requested    Current Medications (verified) Outpatient Encounter Medications as of 05/12/2021  Medication Sig   albuterol (VENTOLIN HFA) 108 (90 Base) MCG/ACT inhaler Inhale 2 puffs into the lungs every 2 (two) hours as needed for wheezing or  shortness of breath (cough).   aspirin 81 MG EC tablet Take 81 mg by mouth daily. Swallow whole.   benzonatate (TESSALON PERLES) 100 MG capsule Take 1 capsule (100 mg total) by mouth 3 (three) times daily as needed for cough (cough).   cephALEXin (KEFLEX) 500 MG capsule Take 1 capsule (500 mg total) by mouth 2 (two) times daily.   Cholecalciferol (VITAMIN D3) 50000 units CAPS Take one capsule by mouth twice weekly.   clonazePAM (KLONOPIN) 0.5 MG tablet Take 1 tablet (0.5 mg total) by mouth 2 (two) times daily as needed for anxiety.   escitalopram (LEXAPRO) 10 MG tablet TAKE 1 TABLET BY MOUTH EVERY DAY   estradiol (ESTRACE) 1 MG tablet TAKE 1.5 TABLETS (1.5 MG TOTAL) BY MOUTH DAILY.   fexofenadine (ALLEGRA) 180 MG tablet Take by mouth.   fluticasone (FLONASE) 50 MCG/ACT nasal spray Place 2 sprays into both nostrils daily.   HYDROcodone bit-homatropine (HYCODAN) 5-1.5 MG/5ML syrup Take 5 mLs by mouth every 8 (eight) hours as needed for cough.   LORazepam (ATIVAN) 0.5 MG tablet Take 1 tablet (0.5 mg total) by mouth daily as needed for anxiety.   magnesium gluconate (MAGONATE) 500 MG tablet Take 500 mg by mouth daily.   meclizine (ANTIVERT) 25 MG tablet Take 1 tablet (25 mg total) by mouth 3 (three) times daily as needed for dizziness.   omeprazole (PRILOSEC) 40 MG capsule Take 1 capsule (40 mg total) by mouth daily. Take once daily in the AM for 12 weeks.   ranitidine (ZANTAC) 150 MG tablet Take by mouth.   rosuvastatin (CRESTOR) 10 MG tablet TAKE 1 TABLET BY MOUTH EVERY DAY   trimethoprim-polymyxin  b (POLYTRIM) ophthalmic solution Place 1 drop into the right eye every 4 (four) hours.   No facility-administered encounter medications on file as of 05/12/2021.    Allergies (verified) Codeine   History: Past Medical History:  Diagnosis Date   Allergic rhinitis    Allergy    Allergy-induced asthma    Anxiety    Arthritis    knees    Asthma    Blood transfusion without reported diagnosis     at 70yo   CAD (coronary artery disease)    Cataract    removed bilateral eyes   Crohn's colitis ([place])    Depression    Fatty liver    GERD (gastroesophageal reflux disease)    History of chicken pox    History of shingles    HLD (hyperlipidemia)    Hyperlipidemia    Neoplasm of tongue    Osteopenia    Prediabetes    Rectocele    Sensorineural hearing loss    Vertigo    Vitamin D deficiency    Past Surgical History:  Procedure Laterality Date   ABDOMINAL HYSTERECTOMY     total   CESAREAN SECTION     COLONOSCOPY  10/2015   completed in [facility]    OOPHORECTOMY Left    TONSILLECTOMY AND ADENOIDECTOMY     UPPER GASTROINTESTINAL ENDOSCOPY     Family History  Problem Relation Age of Onset   Diabetes Mother    Atrial fibrillation Mother    Thyroid disease Mother    Arthritis Father    Asthma Father    Heart disease Father    Hyperlipidemia Father    Hypertension Father    Kidney disease Father    Diabetes Brother    Hypertension Brother    Depression Maternal Grandmother    Heart disease Maternal Grandfather    Hypertension Paternal Grandfather    Stroke Paternal Grandfather    Breast cancer Neg Hx    Colon cancer Neg Hx    Esophageal cancer Neg Hx    Rectal cancer Neg Hx    Stomach cancer Neg Hx    Social History   Socioeconomic History   Marital status: Married    Spouse name: Not on file   Number of children: Not on file   Years of education: Not on file   Highest education level: Not on file  Occupational History   Not on file  Tobacco Use   Smoking status: Never   Smokeless tobacco: Never  Vaping Use   Vaping Use: Never used  Substance and Sexual Activity   Alcohol use: No   Drug use: No   Sexual activity: Yes  Other Topics Concern   Not on file  Social History Narrative   Not on file   Social Determinants of Health   Financial Resource Strain: Low Risk    Difficulty of Paying Living Expenses: Not hard at all  Food Insecurity: No Food  Insecurity   Worried About Charity fundraiser in the Last Year: Never true   Arboriculturist in the Last Year: Never true  Transportation Needs: No Transportation Needs   Lack of Transportation (Medical): No   Lack of Transportation (Non-Medical): No  Physical Activity: Sufficiently Active   Days of Exercise per Week: 5 days   Minutes of Exercise per Session: 30 min  Stress: No Stress Concern Present   Feeling of Stress : Not at all  Social Connections: Socially Integrated   Frequency of Communication  with Friends and Family: More than three times a week   Frequency of Social Gatherings with Friends and Family: More than three times a week   Attends Religious Services: More than 4 times per year   Active Member of [employer] or Organizations: Yes   Attends Music therapist: More than 4 times per year   Marital Status: Married    Tobacco Counseling Counseling given: Not Answered   Clinical Intake:  Pre-visit preparation completed: Yes  Pain : No/denies pain     Nutritional Risks: None Diabetes: No  How often do you need to have someone help you when you read instructions, pamphlets, or other written materials from your doctor or pharmacy?: 1 - Never What is the last grade level you completed in school?: HSG; Business courses  Diabetic? no  Interpreter Needed?: No  Information entered by :: Lisette Abu, LPN   Activities of Daily Living In your present state of health, do you have any difficulty performing the following activities: 05/12/2021  Hearing? N  Vision? N  Difficulty concentrating or making decisions? N  Walking or climbing stairs? N  Dressing or bathing? N  Doing errands, shopping? N  Preparing Food and eating ? N  Using the Toilet? N  In the past six months, have you accidently leaked urine? N  Do you have problems with loss of bowel control? N  Managing your Medications? N  Managing your Finances? N  Housekeeping or managing your  Housekeeping? N  Some recent data might be hidden    Patient Care Team: Binnie Rail, MD as PCP - General (Internal Medicine)  Indicate any recent Medical Services you may have received from other than [facility] providers in the past year (date may be approximate).     Assessment:   This is a routine wellness examination for Nerine.  Hearing/Vision screen Hearing Screening - Comments:: Patient denied any hearing difficulty.   No hearing aids.  Vision Screening - Comments:: Patient wears corrective glasses/contacts for driving and distance.  History of cataracts/ LASIX surgery.  Eye exam done annually by: Jola Schmidt, MD.  Dietary issues and exercise activities discussed: Current Exercise Habits: Home exercise routine, Type of exercise: walking, Time (Minutes): 30, Frequency (Times/Week): 5, Weekly Exercise (Minutes/Week): 150, Intensity: Mild, Exercise limited by: orthopedic condition(s)   Goals Addressed   None   Depression Screen PHQ 2/9 Scores 05/12/2021 11/14/2019 10/10/2018 04/26/2017  PHQ - 2 Score 0 1 0 0  PHQ- 9 Score - 5 0 -    Fall Risk Fall Risk  05/12/2021 11/13/2019 10/10/2018 04/26/2017  Falls in the past year? 0 0 0 No  Number falls in past yr: 0 0 - -  Injury with Fall? 0 0 - -  Risk for fall due to : No Fall Risks - - -  Follow up Falls evaluation completed Falls evaluation completed - -    FALL RISK PREVENTION PERTAINING TO THE HOME:  Any stairs in or around the home? No  If so, are there any without handrails? No  Home free of loose throw rugs in walkways, pet beds, electrical cords, etc? Yes  Adequate lighting in your home to reduce risk of falls? Yes   ASSISTIVE DEVICES UTILIZED TO PREVENT FALLS:  Life alert? No  Use of a cane, walker or w/c? No  Grab bars in the bathroom? No  Shower chair or bench in shower? No  Elevated toilet seat or a handicapped toilet? Yes   TIMED UP AND  GO:  Was the test performed? No .  Length of time to ambulate 10 feet:  n/a sec.   Gait steady and fast without use of assistive device  Cognitive Function: Normal cognitive status assessed by direct observation by this Nurse Health Advisor. No abnormalities found.          Immunizations Immunization History  Administered Date(s) Administered   Fluad Quad(high Dose 65+) 03/29/2019   Influenza, High Dose Seasonal PF 03/31/2018   Influenza,inj,Quad PF,6+ Mos 04/26/2017   Influenza-Unspecified 03/31/2020   Moderna Sars-Covid-2 Vaccination 09/18/2019, 10/20/2019, 06/01/2020   Pneumococcal Conjugate-13 03/31/2018   Pneumococcal Polysaccharide-23 03/29/2019   Zoster Recombinat (Shingrix) 03/31/2020, 10/08/2020    TDAP status: Due, Education has been provided regarding the importance of this vaccine. Advised may receive this vaccine at local pharmacy or Health Dept. Aware to provide a copy of the vaccination record if obtained from local pharmacy or Health Dept. Verbalized acceptance and understanding.  Flu Vaccine status: Due, Education has been provided regarding the importance of this vaccine. Advised may receive this vaccine at local pharmacy or Health Dept. Aware to provide a copy of the vaccination record if obtained from local pharmacy or Health Dept. Verbalized acceptance and understanding.  Pneumococcal vaccine status: Up to date  Covid-19 vaccine status: Completed vaccines  Qualifies for Shingles Vaccine? Yes   Zostavax completed Yes   Shingrix Completed?: Yes  Screening Tests Health Maintenance  Topic Date Due   TETANUS/TDAP  Never done   COVID-19 Vaccine (4 - Booster for Moderna series) 07/27/2020   INFLUENZA VACCINE  02/21/2021   MAMMOGRAM  04/21/2022   DEXA SCAN  04/22/2023   COLONOSCOPY (Pts 45-96yr Insurance coverage will need to be confirmed)  03/19/2027   Pneumonia Vaccine 68 Years old  Completed   Hepatitis C Screening  Completed   Zoster Vaccines- Shingrix  Completed   HPV VACCINES  Aged Out    Health Maintenance  Health  Maintenance Due  Topic Date Due   TETANUS/TDAP  Never done   COVID-19 Vaccine (4 - Booster for Moderna series) 07/27/2020   INFLUENZA VACCINE  02/21/2021    Colorectal cancer screening: Type of screening: Colonoscopy. Completed 03/18/2020. Repeat every 7 years  Mammogram status: Completed 04/21/2020. Repeat every year  Bone Density status: Completed 04/21/2020. Results reflect: Bone density results: OSTEOPENIA. Repeat every 2-3 years.  Lung Cancer Screening: (Low Dose CT Chest recommended if Age 68-80years, 30 pack-year currently smoking OR have quit w/in 15years.) does not qualify.   Lung Cancer Screening Referral: no  Additional Screening:  Hepatitis C Screening: does qualify; Completed: yes  Vision Screening: Recommended annual ophthalmology exams for early detection of glaucoma and other disorders of the eye. Is the patient up to date with their annual eye exam?  Yes  Who is the provider or what is the name of the office in which the patient attends annual eye exams? BJola Schmidt MD. If pt is not established with a provider, would they like to be referred to a provider to establish care? No .   Dental Screening: Recommended annual dental exams for proper oral hygiene  Community Resource Referral / Chronic Care Management: CRR required this visit?  No   CCM required this visit?  No      Plan:     I have personally reviewed and noted the following in the patient's chart:   Medical and social history Use of alcohol, tobacco or illicit drugs  Current medications and supplements including opioid prescriptions.  Functional ability and status Nutritional status Physical activity Advanced directives List of other physicians Hospitalizations, surgeries, and ER visits in previous 12 months Vitals Screenings to include cognitive, depression, and falls Referrals and appointments  In addition, I have reviewed and discussed with patient certain preventive protocols, quality  metrics, and best practice recommendations. A written personalized care plan for preventive services as well as general preventive health recommendations were provided to patient.     Sheral Flow, LPN   [id number]   Nurse Notes:  Patient is cogitatively intact. There were no vitals filed for this visit. There is no height or weight on file to calculate BMI. Patient stated that she has no issues with gait or balance; does not use any assistive devices. Medications reviewed with patient; no opioid use noted. Hearing Screening - Comments:: Patient denied any hearing difficulty.   No hearing aids.  Vision Screening - Comments:: Patient wears corrective glasses/contacts for driving and distance.  History of cataracts/ LASIX surgery.  Eye exam done annually by: Jola Schmidt, MD.

## 2021-05-17 ENCOUNTER — Other Ambulatory Visit: Payer: Self-pay

## 2021-05-17 ENCOUNTER — Other Ambulatory Visit: Payer: Self-pay | Admitting: Internal Medicine

## 2021-05-17 ENCOUNTER — Encounter: Payer: Self-pay | Admitting: Internal Medicine

## 2021-05-17 DIAGNOSIS — Z1231 Encounter for screening mammogram for malignant neoplasm of breast: Secondary | ICD-10-CM

## 2021-05-17 MED ORDER — ESTRADIOL 1 MG PO TABS: 1.5000 mg | ORAL_TABLET | Freq: Every day | ORAL | 0 refills | Status: DC

## 2021-05-18 MED ORDER — CLONAZEPAM 0.5 MG PO TABS: 0.5000 mg | ORAL_TABLET | Freq: Two times a day (BID) | ORAL | 2 refills | Status: DC | PRN

## 2021-05-18 NOTE — Addendum Note (Signed)
Addended by: Binnie Rail on: 05/18/2021 03:00 PM   Modules accepted: Orders

## 2021-05-19 DIAGNOSIS — H524 Presbyopia: Secondary | ICD-10-CM | POA: Diagnosis not present

## 2021-05-19 DIAGNOSIS — Z961 Presence of intraocular lens: Secondary | ICD-10-CM | POA: Diagnosis not present

## 2021-05-19 DIAGNOSIS — H26491 Other secondary cataract, right eye: Secondary | ICD-10-CM | POA: Diagnosis not present

## 2021-05-23 ENCOUNTER — Encounter: Payer: Self-pay | Admitting: Internal Medicine

## 2021-05-23 ENCOUNTER — Other Ambulatory Visit: Payer: Self-pay

## 2021-05-23 ENCOUNTER — Ambulatory Visit (INDEPENDENT_AMBULATORY_CARE_PROVIDER_SITE_OTHER): Payer: PPO | Admitting: Internal Medicine

## 2021-05-23 DIAGNOSIS — N390 Urinary tract infection, site not specified: Secondary | ICD-10-CM | POA: Insufficient documentation

## 2021-05-23 DIAGNOSIS — R300 Dysuria: Secondary | ICD-10-CM

## 2021-05-23 DIAGNOSIS — N3000 Acute cystitis without hematuria: Secondary | ICD-10-CM

## 2021-05-23 LAB — POC URINALSYSI DIPSTICK (AUTOMATED)
Glucose, UA: NEGATIVE
Ketones, UA: POSITIVE
Nitrite, UA: POSITIVE
Protein, UA: POSITIVE — AB
Spec Grav, UA: >=1.03 — AB (ref 1.010–1.025)
Urobilinogen, UA: 4 E.U./dL — AB
pH, UA: 5 (ref 5.0–8.0)

## 2021-05-23 MED ORDER — PHENAZOPYRIDINE HCL 200 MG PO TABS: 200.0000 mg | ORAL_TABLET | Freq: Three times a day (TID) | ORAL | 2 refills | Status: DC | PRN

## 2021-05-23 MED ORDER — SULFAMETHOXAZOLE-TRIMETHOPRIM 800-160 MG PO TABS: 1.0000 | ORAL_TABLET | Freq: Two times a day (BID) | ORAL | 0 refills | Status: DC

## 2021-05-23 NOTE — Patient Instructions (Signed)
Take the antibiotic as prescribed.  Pyridium was sent in as well.  Take tylenol if needed.     Increase your water intake.   Call if no improvement     Urinary Tract Infection, Adult A urinary tract infection (UTI) is an infection of any part of the urinary tract, which includes the kidneys, ureters, bladder, and urethra. These organs make, store, and get rid of urine in the body. UTI can be a bladder infection (cystitis) or kidney infection (pyelonephritis). What are the causes? This infection may be caused by fungi, viruses, or bacteria. Bacteria are the most common cause of UTIs. This condition can also be caused by repeated incomplete emptying of the bladder during urination. What increases the risk? This condition is more likely to develop if: You ignore your need to urinate or hold urine for long periods of time. You do not empty your bladder completely during urination. You wipe back to front after urinating or having a bowel movement, if you are female. You are uncircumcised, if you are female. You are constipated. You have a urinary catheter that stays in place (indwelling). You have a weak defense (immune) system. You have a medical condition that affects your bowels, kidneys, or bladder. You have diabetes. You take antibiotic medicines frequently or for long periods of time, and the antibiotics no longer work well against certain types of infections (antibiotic resistance). You take medicines that irritate your urinary tract. You are exposed to chemicals that irritate your urinary tract. You are female.  What are the signs or symptoms? Symptoms of this condition include: Fever. Frequent urination or passing small amounts of urine frequently. Needing to urinate urgently. Pain or burning with urination. Urine that smells bad or unusual. Cloudy urine. Pain in the lower abdomen or back. Trouble urinating. Blood in the urine. Vomiting or being less hungry than  normal. Diarrhea or abdominal pain. Vaginal discharge, if you are female.  How is this diagnosed? This condition is diagnosed with a medical history and physical exam. You will also need to provide a urine sample to test your urine. Other tests may be done, including: Blood tests. Sexually transmitted disease (STD) testing.  If you have had more than one UTI, a cystoscopy or imaging studies may be done to determine the cause of the infections. How is this treated? Treatment for this condition often includes a combination of two or more of the following: Antibiotic medicine. Other medicines to treat less common causes of UTI. Over-the-counter medicines to treat pain. Drinking enough water to stay hydrated.  Follow these instructions at home: Take over-the-counter and prescription medicines only as told by your health care provider. If you were prescribed an antibiotic, take it as told by your health care provider. Do not stop taking the antibiotic even if you start to feel better. Avoid alcohol, caffeine, tea, and carbonated beverages. They can irritate your bladder. Drink enough fluid to keep your urine clear or pale yellow. Keep all follow-up visits as told by your health care provider. This is important. Make sure to: Empty your bladder often and completely. Do not hold urine for long periods of time. Empty your bladder before and after sex. Wipe from front to back after a bowel movement if you are female. Use each tissue one time when you wipe. Contact a health care provider if: You have back pain. You have a fever. You feel nauseous or vomit. Your symptoms do not get better after 3 days. Your symptoms go  away and then return. Get help right away if: You have severe back pain or lower abdominal pain. You are vomiting and cannot keep down any medicines or water. This information is not intended to replace advice given to you by your health care provider. Make sure you discuss  any questions you have with your health care provider. Document Released: 04/19/2005 Document Revised: 12/22/2015 Document Reviewed: 05/31/2015 Elsevier Interactive Patient Education  [employer].

## 2021-05-23 NOTE — Assessment & Plan Note (Signed)
Acute Urine dip consistent with UTI Will send urine for culture Take the antibiotic as prescribed.  Bactrim DS 800-160 mg twice daily x1 week Take tylenol if needed.   Increase your water intake.  Call if no improvement

## 2021-05-23 NOTE — Progress Notes (Signed)
Subjective:    Patient ID: Susan Norris, female    DOB: October 21, 1950, 70 y.o.   MRN: [id number]  This visit occurred during the SARS-CoV-2 public health emergency.  Safety protocols were in place, including screening questions prior to the visit, additional usage of staff PPE, and extensive cleaning of exam room while observing appropriate contact time as indicated for disinfecting solutions.    HPI She is here for an acute visit.    ? UTI:  Her symptoms started  3 days ago.  She states dysuria, urinary frequency, urinary urgency, pressure in lower abdomen, diarrhea.    No hematuria, back pain, nausea, fever.     Medications and allergies reviewed with patient and updated if appropriate.  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 8 g 0   aspirin 81 MG EC tablet Take 81 mg by mouth daily. Swallow whole.     benzonatate (TESSALON PERLES) 100 MG capsule Take 1 capsule (100 mg total) by mouth 3 (three) times daily as needed for cough (cough). 20 capsule 0   Cholecalciferol (VITAMIN D3) 50000 units CAPS Take one capsule by mouth twice weekly.  5   clonazePAM (KLONOPIN) 0.5 MG tablet Take 1 tablet (0.5 mg total) by mouth 2  (two) times daily as needed for anxiety. 60 tablet 2   escitalopram (LEXAPRO) 10 MG tablet TAKE 1 TABLET BY MOUTH EVERY DAY 90 tablet 2   estradiol (ESTRACE) 1 MG tablet Take 1.5 tablets (1.5 mg total) by mouth daily. 135 tablet 0   fexofenadine (ALLEGRA) 180 MG tablet Take by mouth.     fluticasone (FLONASE) 50 MCG/ACT nasal spray Place 2 sprays into both nostrils daily. 9.9 g 0   magnesium gluconate (MAGONATE) 500 MG tablet Take 500 mg by mouth daily.     meclizine (ANTIVERT) 25 MG tablet Take 1 tablet (25 mg total) by mouth 3 (three) times daily as needed for dizziness. 30 tablet 2   omeprazole (PRILOSEC) 40 MG capsule Take 1 capsule (40 mg total) by mouth daily. Take once daily in the AM for 12 weeks. 90 capsule 3   ranitidine (ZANTAC) 150 MG tablet Take by mouth.     rosuvastatin (CRESTOR) 10 MG tablet TAKE 1 TABLET BY MOUTH EVERY DAY 90 tablet 0   trimethoprim-polymyxin b (POLYTRIM) ophthalmic solution Place 1 drop into the right eye every 4 (four) hours. 10 mL 0   No current facility-administered medications on file prior to visit.    Past Medical History:  Diagnosis Date   Allergic rhinitis    Allergy    Allergy-induced asthma    Anxiety    Arthritis    knees    Asthma    Blood transfusion without reported diagnosis  at 70yo   CAD (coronary artery disease)    Cataract    removed bilateral eyes   Crohn's colitis ([place])    Depression    Fatty liver    GERD (gastroesophageal reflux disease)    History of chicken pox    History of shingles    HLD (hyperlipidemia)    Hyperlipidemia    Neoplasm of tongue    Osteopenia    Prediabetes    Rectocele    Sensorineural hearing loss    Vertigo    Vitamin D deficiency     Past Surgical History:  Procedure Laterality Date   ABDOMINAL HYSTERECTOMY     total   CESAREAN SECTION     COLONOSCOPY  10/2015   completed in [facility]    OOPHORECTOMY Left    TONSILLECTOMY AND ADENOIDECTOMY     UPPER GASTROINTESTINAL ENDOSCOPY       Social History   Socioeconomic History   Marital status: Married    Spouse name: Not on file   Number of children: Not on file   Years of education: Not on file   Highest education level: Not on file  Occupational History   Not on file  Tobacco Use   Smoking status: Never   Smokeless tobacco: Never  Vaping Use   Vaping Use: Never used  Substance and Sexual Activity   Alcohol use: No   Drug use: No   Sexual activity: Yes  Other Topics Concern   Not on file  Social History Narrative   Not on file   Social Determinants of Health   Financial Resource Strain: Low Risk    Difficulty of Paying Living Expenses: Not hard at all  Food Insecurity: No Food Insecurity   Worried About Charity fundraiser in the Last Year: Never true   Ran Out of Food in the Last Year: Never true  Transportation Needs: No Transportation Needs   Lack of Transportation (Medical): No   Lack of Transportation (Non-Medical): No  Physical Activity: Sufficiently Active   Days of Exercise per Week: 5 days   Minutes of Exercise per Session: 30 min  Stress: No Stress Concern Present   Feeling of Stress : Not at all  Social Connections: Socially Integrated   Frequency of Communication with Friends and Family: More than three times a week   Frequency of Social Gatherings with Friends and Family: More than three times a week   Attends Religious Services: More than 4 times per year   Active Member of [employer] or Organizations: Yes   Attends Music therapist: More than 4 times per year   Marital Status: Married    Family History  Problem Relation Age of Onset   Diabetes Mother    Atrial fibrillation Mother    Thyroid disease Mother    Arthritis Father    Asthma Father    Heart disease Father    Hyperlipidemia Father    Hypertension Father    Kidney disease Father    Diabetes Brother    Hypertension Brother    Depression Maternal Grandmother    Heart disease Maternal Grandfather     Hypertension Paternal Grandfather    Stroke Paternal Grandfather    Breast cancer Neg Hx    Colon cancer Neg Hx    Esophageal cancer Neg Hx    Rectal cancer Neg Hx    Stomach cancer Neg Hx     Review of Systems     Objective:   Vitals:  05/23/21 1553  BP: 120/74  Pulse: 70  Temp: 98.6 F (37 C)  SpO2: 97%   BP Readings from Last 3 Encounters:  05/23/21 120/74  03/18/20 (!) 124/59  01/01/20 118/70   Wt Readings from Last 3 Encounters:  05/23/21 186 lb (84.4 kg)  11/23/20 184 lb (83.5 kg)  03/18/20 184 lb (83.5 kg)   Body mass index is 31.93 kg/m.   Physical Exam    Constitutional:      General: She is not in acute distress.    Appearance: Normal appearance. She is not ill-appearing.  HENT:     Head: Normocephalic and atraumatic.  Abdominal:     General: There is no distension.     Palpations: Abdomen is soft.     Tenderness: There is mild suprapubic tenderness. There is no right CVA tenderness, left CVA tenderness, guarding or rebound.  Skin:    General: Skin is warm and dry.  Neurological:     Mental Status: She is alert.       Assessment & Plan:    See Problem List for Assessment and Plan of chronic medical problems.

## 2021-05-25 LAB — CULTURE, URINE COMPREHENSIVE

## 2021-05-31 ENCOUNTER — Other Ambulatory Visit: Payer: Self-pay | Admitting: Internal Medicine

## 2021-06-02 DIAGNOSIS — D225 Melanocytic nevi of trunk: Secondary | ICD-10-CM | POA: Diagnosis not present

## 2021-06-02 DIAGNOSIS — D2262 Melanocytic nevi of left upper limb, including shoulder: Secondary | ICD-10-CM | POA: Diagnosis not present

## 2021-06-02 DIAGNOSIS — D2272 Melanocytic nevi of left lower limb, including hip: Secondary | ICD-10-CM | POA: Diagnosis not present

## 2021-06-02 DIAGNOSIS — L281 Prurigo nodularis: Secondary | ICD-10-CM | POA: Diagnosis not present

## 2021-06-02 DIAGNOSIS — D2261 Melanocytic nevi of right upper limb, including shoulder: Secondary | ICD-10-CM | POA: Diagnosis not present

## 2021-06-02 DIAGNOSIS — D1801 Hemangioma of skin and subcutaneous tissue: Secondary | ICD-10-CM | POA: Diagnosis not present

## 2021-06-02 DIAGNOSIS — L821 Other seborrheic keratosis: Secondary | ICD-10-CM | POA: Diagnosis not present

## 2021-06-02 DIAGNOSIS — D2271 Melanocytic nevi of right lower limb, including hip: Secondary | ICD-10-CM | POA: Diagnosis not present

## 2021-06-10 ENCOUNTER — Other Ambulatory Visit: Payer: Self-pay | Admitting: Internal Medicine

## 2021-06-12 ENCOUNTER — Other Ambulatory Visit: Payer: Self-pay | Admitting: Internal Medicine

## 2021-06-13 MED ORDER — SULFAMETHOXAZOLE-TRIMETHOPRIM 800-160 MG PO TABS: 1.0000 | ORAL_TABLET | Freq: Two times a day (BID) | ORAL | 0 refills | Status: DC

## 2021-06-21 ENCOUNTER — Encounter: Payer: PPO | Admitting: Internal Medicine

## 2021-06-23 ENCOUNTER — Ambulatory Visit
Admission: RE | Admit: 2021-06-23 | Discharge: 2021-06-23 | Disposition: A | Discharge status: Home / Self Care | Payer: PPO | Source: Ambulatory Visit | Attending: Internal Medicine | Admitting: Internal Medicine

## 2021-06-23 DIAGNOSIS — Z1231 Encounter for screening mammogram for malignant neoplasm of breast: Secondary | ICD-10-CM | POA: Diagnosis not present

## 2021-06-29 ENCOUNTER — Encounter: Payer: Self-pay | Admitting: Internal Medicine

## 2021-06-29 DIAGNOSIS — Z79890 Hormone replacement therapy (postmenopausal): Secondary | ICD-10-CM | POA: Insufficient documentation

## 2021-06-29 NOTE — Progress Notes (Signed)
Subjective:    Patient ID: Susan Norris, female    DOB: [birth year], 70 y.o.   MRN: [id number]   This visit occurred during the SARS-CoV-2 public health emergency.  Safety protocols were in place, including screening questions prior to the visit, additional usage of staff PPE, and extensive cleaning of exam room while observing appropriate contact time as indicated for disinfecting solutions.    HPI She is here for a physical exam.   She is taking azo urinary health and drinking cranberry juice.  She still has some urinary frequency, but no dysuria.    She has less energy.  She has not been exercising.  Her right knee is very painful and may need a replacement.  She will consider doing that next year.  She does still have some low-grade depression and has some increased stress and has been stress eating.  This is causing her to not really want to do much.  She knows with time that will improve.   Medications and allergies reviewed with patient and updated if appropriate.  Patient Active Problem List   Diagnosis Date Noted   Proteinuria 06/30/2021   Hormone replacement therapy (HRT) 06/29/2021   UTI (urinary tract infection) 05/23/2021   Acute cystitis 05/23/2021   COVID 12/21/2020   Hiatal hernia, moderate 12/02/2019   Osteopenia 11/13/2019   Osteoarthritis of right knee 11/13/2019   Agatston coronary artery calcium score between 100 and 199 11/13/2019   Vertigo 11/12/2019   Prediabetes 11/12/2019   Allergy-induced asthma 04/26/2017   Anxiety and depression 04/26/2017   Hyperlipidemia 04/26/2017   Osteoarthritis 04/26/2017   GERD (gastroesophageal reflux disease) 04/26/2017   Crohn disease ([place]) 04/26/2017    Current Outpatient Medications on File Prior to Visit  Medication Sig Dispense Refill   albuterol (VENTOLIN HFA) 108 (90 Base) MCG/ACT inhaler Inhale 2 puffs into the lungs every 2 (two) hours as needed for wheezing or shortness of breath (cough). 8 g 0   aspirin  81 MG EC tablet Take 81 mg by mouth daily. Swallow whole.     benzonatate (TESSALON PERLES) 100 MG capsule Take 1 capsule (100 mg total) by mouth 3 (three) times daily as needed for cough (cough). 20 capsule 0   Cholecalciferol (VITAMIN D3) 50000 units CAPS Take one capsule by mouth twice weekly.  5   clonazePAM (KLONOPIN) 0.5 MG tablet Take 1 tablet (0.5 mg total) by mouth 2 (two) times daily as needed for anxiety. 60 tablet 2   escitalopram (LEXAPRO) 10 MG tablet TAKE 1 TABLET BY MOUTH EVERY DAY 90 tablet 2   estradiol (ESTRACE) 1 MG tablet Take 1.5 tablets (1.5 mg total) by mouth daily. 135 tablet 0   fexofenadine (ALLEGRA) 180 MG tablet Take by mouth.     fluticasone (FLONASE) 50 MCG/ACT nasal spray Place 2 sprays into both nostrils daily. 9.9 g 0   magnesium gluconate (MAGONATE) 500 MG tablet Take 500 mg by mouth daily.     meclizine (ANTIVERT) 25 MG tablet Take 1 tablet (25 mg total) by mouth 3 (three) times daily as needed for dizziness. 30 tablet 2   omeprazole (PRILOSEC) 40 MG capsule Take 1 capsule (40 mg total) by mouth daily. Take once daily in the AM for 12 weeks. 90 capsule 3   phenazopyridine (PYRIDIUM) 200 MG tablet Take 1 tablet (200 mg total) by mouth 3 (three) times daily as needed for pain. 10 tablet 2   rosuvastatin (CRESTOR) 10 MG tablet TAKE 1 TABLET BY  MOUTH EVERY DAY 90 tablet 0   No current facility-administered medications on file prior to visit.    Past Medical History:  Diagnosis Date   Allergic rhinitis    Allergy    Allergy-induced asthma    Anxiety    Arthritis    knees    Asthma    Blood transfusion without reported diagnosis    at 70yo   CAD (coronary artery disease)    Cataract    removed bilateral eyes   Crohn's colitis ([place])    Depression    Fatty liver    GERD (gastroesophageal reflux disease)    History of chicken pox    History of shingles    HLD (hyperlipidemia)    Hyperlipidemia    Neoplasm of tongue    Osteopenia    Prediabetes     Rectocele    Sensorineural hearing loss    Vertigo    Vitamin D deficiency     Past Surgical History:  Procedure Laterality Date   ABDOMINAL HYSTERECTOMY     total   CESAREAN SECTION     COLONOSCOPY  10/2015   completed [facility]    OOPHORECTOMY Left    TONSILLECTOMY AND ADENOIDECTOMY     UPPER GASTROINTESTINAL ENDOSCOPY      Social History   Socioeconomic History   Marital status: Married    Spouse name: Not on file   Number of children: Not on file   Years of education: Not on file   Highest education level: Not on file  Occupational History   Not on file  Tobacco Use   Smoking status: Never   Smokeless tobacco: Never  Vaping Use   Vaping Use: Never used  Substance and Sexual Activity   Alcohol use: No   Drug use: No   Sexual activity: Yes  Other Topics Concern   Not on file  Social History Narrative   Not on file   Social Determinants of Health   Financial Resource Strain: Low Risk    Difficulty of Paying Living Expenses: Not hard at all  Food Insecurity: No Food Insecurity   Worried About Charity fundraiser in the Last Year: Never true   Ran Out of Food in the Last Year: Never true  Transportation Needs: No Transportation Needs   Lack of Transportation (Medical): No   Lack of Transportation (Non-Medical): No  Physical Activity: Sufficiently Active   Days of Exercise per Week: 5 days   Minutes of Exercise per Session: 30 min  Stress: No Stress Concern Present   Feeling of Stress : Not at all  Social Connections: Socially Integrated   Frequency of Communication with Friends and Family: More than three times a week   Frequency of Social Gatherings with Friends and Family: More than three times a week   Attends Religious Services: More than 4 times per year   Active Member of [employer] or Organizations: Yes   Attends Music therapist: More than 4 times per year   Marital Status: Married    Family History  Problem Relation Age of Onset    Diabetes Mother    Atrial fibrillation Mother    Thyroid disease Mother    Arthritis Father    Asthma Father    Heart disease Father    Hyperlipidemia Father    Hypertension Father    Kidney disease Father    Diabetes Brother    Hypertension Brother    Depression Maternal Grandmother  Heart disease Maternal Grandfather    Hypertension Paternal Grandfather    Stroke Paternal Grandfather    Breast cancer Neg Hx    Colon cancer Neg Hx    Esophageal cancer Neg Hx    Rectal cancer Neg Hx    Stomach cancer Neg Hx     Review of Systems  Constitutional:  Negative for chills and fever.  Eyes:  Negative for visual disturbance.  Respiratory:  Negative for cough, shortness of breath and wheezing.   Cardiovascular:  Negative for chest pain, palpitations and leg swelling.  Gastrointestinal:  Negative for abdominal pain, blood in stool, constipation, diarrhea and nausea.  Genitourinary:  Positive for frequency. Negative for dysuria and hematuria.  Musculoskeletal:  Positive for arthralgias (right knee). Negative for back pain.  Skin:  Negative for rash.  Neurological:  Negative for dizziness, light-headedness and headaches.  Psychiatric/Behavioral:  Positive for dysphoric mood. Negative for sleep disturbance. The patient is nervous/anxious.       Objective:   Vitals:   06/30/21 0808  BP: 130/90  Pulse: (!) 58  Temp: 98.1 F (36.7 C)  SpO2: 97%   Filed Weights   06/30/21 0808  Weight: 190 lb (86.2 kg)   Body mass index is 32.61 kg/m.  BP Readings from Last 3 Encounters:  06/30/21 130/90  05/23/21 120/74  03/18/20 (!) 124/59    Wt Readings from Last 3 Encounters:  06/30/21 190 lb (86.2 kg)  05/23/21 186 lb (84.4 kg)  11/23/20 184 lb (83.5 kg)    Depression screen [facility] 2/9 05/12/2021 11/14/2019 10/10/2018 04/26/2017  Decreased Interest 0 1 0 0  Down, Depressed, Hopeless 0 0 0 0  PHQ - 2 Score 0 1 0 0  Altered sleeping - 1 0 -  Tired, decreased energy - 1 0 -  Change  in appetite - 2 0 -  Feeling bad or failure about yourself  - 0 0 -  Trouble concentrating - 0 0 -  Moving slowly or fidgety/restless - 0 0 -  Suicidal thoughts - 0 0 -  PHQ-9 Score - 5 0 -     GAD 7 : Generalized Anxiety Score 11/14/2019  Nervous, Anxious, on Edge 0  Control/stop worrying 0  Worry too much - different things 0  Trouble relaxing 0  Restless 0  Easily annoyed or irritable 0  Afraid - awful might happen 0  Total GAD 7 Score 0       Physical Exam Constitutional: She appears well-developed and well-nourished. No distress.  HENT:  Head: Normocephalic and atraumatic.  Right Ear: External ear normal. Normal ear canal and TM Left Ear: External ear normal.  Normal ear canal and TM Mouth/Throat: Oropharynx is clear and moist.  Eyes: Conjunctivae and EOM are normal.  Neck: Neck supple. No tracheal deviation present. No thyromegaly present.  No carotid bruit  Cardiovascular: Normal rate, regular rhythm and normal heart sounds.   No murmur heard.  No edema. Pulmonary/Chest: Effort normal and breath sounds normal. No respiratory distress. She has no wheezes. She has no rales.  Breast: deferred   Abdominal: Soft. She exhibits no distension. There is no tenderness.  Lymphadenopathy: She has no cervical adenopathy.  Skin: Skin is warm and dry. She is not diaphoretic.  Psychiatric: She has a normal mood and affect. Her behavior is normal.     Lab Results  Component Value Date   WBC 8.9 11/13/2019   HGB 13.2 11/13/2019   HCT 40.2 11/13/2019   PLT 240.0 11/13/2019  GLUCOSE 97 11/13/2019   CHOL 239 (H) 11/13/2019   TRIG 140.0 11/13/2019   HDL 65.00 11/13/2019   LDLCALC 146 (H) 11/13/2019   ALT 16 11/13/2019   AST 21 11/13/2019   NA 137 11/13/2019   K 4.3 11/13/2019   CL 100 11/13/2019   CREATININE 0.64 11/13/2019   BUN 11 11/13/2019   CO2 29 11/13/2019   TSH 1.09 11/13/2019   HGBA1C 5.7 11/13/2019         Assessment & Plan:   Physical  exam: Screening blood work  ordered Exercise  none Weight  encouraged weight loss Substance abuse  none   Reviewed recommended immunizations.   Health Maintenance  Topic Date Due   COVID-19 Vaccine (4 - Booster for Moderna series) 07/16/2021 (Originally 07/27/2020)   DEXA SCAN  04/22/2023   MAMMOGRAM  06/24/2023   TETANUS/TDAP  07/24/2025   COLONOSCOPY (Pts 45-44yr Insurance coverage will need to be confirmed)  03/19/2027   Pneumonia Vaccine 70 Years old  Completed   INFLUENZA VACCINE  Completed   Hepatitis C Screening  Completed   Zoster Vaccines- Shingrix  Completed   HPV VACCINES  Aged Out          See Problem List for Assessment and Plan of chronic medical problems.

## 2021-06-29 NOTE — Patient Instructions (Addendum)
Blood work was ordered.     Medications changes include :   None    Please followup in 6 months    Health Maintenance, Female Adopting a healthy lifestyle and getting preventive care are important in promoting health and wellness. Ask your health care provider about: The right schedule for you to have regular tests and exams. Things you can do on your own to prevent diseases and keep yourself healthy. What should I know about diet, weight, and exercise? Eat a healthy diet  Eat a diet that includes plenty of vegetables, fruits, low-fat dairy products, and lean protein. Do not eat a lot of foods that are high in solid fats, added sugars, or sodium. Maintain a healthy weight Body mass index (BMI) is used to identify weight problems. It estimates body fat based on height and weight. Your health care provider can help determine your BMI and help you achieve or maintain a healthy weight. Get regular exercise Get regular exercise. This is one of the most important things you can do for your health. Most adults should: Exercise for at least 150 minutes each week. The exercise should increase your heart rate and make you sweat (moderate-intensity exercise). Do strengthening exercises at least twice a week. This is in addition to the moderate-intensity exercise. Spend less time sitting. Even light physical activity can be beneficial. Watch cholesterol and blood lipids Have your blood tested for lipids and cholesterol at 68 years of age, then have this test every 5 years. Have your cholesterol levels checked more often if: Your lipid or cholesterol levels are high. You are older than 68 years of age. You are at high risk for heart disease. What should I know about cancer screening? Depending on your health history and family history, you may need to have cancer screening at various ages. This may include screening for: Breast cancer. Cervical cancer. Colorectal cancer. Skin  cancer. Lung cancer. What should I know about heart disease, diabetes, and high blood pressure? Blood pressure and heart disease High blood pressure causes heart disease and increases the risk of stroke. This is more likely to develop in people who have high blood pressure readings or are overweight. Have your blood pressure checked: Every 3-5 years if you are 28-63 years of age. Every year if you are 64 years old or older. Diabetes Have regular diabetes screenings. This checks your fasting blood sugar level. Have the screening done: Once every three years after age 74 if you are at a normal weight and have a low risk for diabetes. More often and at a younger age if you are overweight or have a high risk for diabetes. What should I know about preventing infection? Hepatitis B If you have a higher risk for hepatitis B, you should be screened for this virus. Talk with your health care provider to find out if you are at risk for hepatitis B infection. Hepatitis C Testing is recommended for: Everyone born from [birth year] through 1963. Anyone with known risk factors for hepatitis C. Sexually transmitted infections (STIs) Get screened for STIs, including gonorrhea and chlamydia, if: You are sexually active and are younger than 68 years of age. You are older than 68 years of age and your health care provider tells you that you are at risk for this type of infection. Your sexual activity has changed since you were last screened, and you are at increased risk for chlamydia or gonorrhea. Ask your health care provider if you are at risk.  Ask your health care provider about whether you are at high risk for HIV. Your health care provider may recommend a prescription medicine to help prevent HIV infection. If you choose to take medicine to prevent HIV, you should first get tested for HIV. You should then be tested every 3 months for as long as you are taking the medicine. Pregnancy If you are about to stop  having your period (premenopausal) and you may become pregnant, seek counseling before you get pregnant. Take 400 to 800 micrograms (mcg) of folic acid every day if you become pregnant. Ask for birth control (contraception) if you want to prevent pregnancy. Osteoporosis and menopause Osteoporosis is a disease in which the bones lose minerals and strength with aging. This can result in bone fractures. If you are 70 years old or older, or if you are at risk for osteoporosis and fractures, ask your health care provider if you should: Be screened for bone loss. Take a calcium or vitamin D supplement to lower your risk of fractures. Be given hormone replacement therapy (HRT) to treat symptoms of menopause. Follow these instructions at home: Alcohol use Do not drink alcohol if: Your health care provider tells you not to drink. You are pregnant, may be pregnant, or are planning to become pregnant. If you drink alcohol: Limit how much you have to: 0-1 drink a day. Know how much alcohol is in your drink. In the [place] one drink equals one 12 oz bottle of beer (355 mL), one 5 oz glass of wine (148 mL), or one 1 oz glass of hard liquor (44 mL). Lifestyle Do not use any products that contain nicotine or tobacco. These products include cigarettes, chewing tobacco, and vaping devices, such as e-cigarettes. If you need help quitting, ask your health care provider. Do not use street drugs. Do not share needles. Ask your health care provider for help if you need support or information about quitting drugs. General instructions Schedule regular health, dental, and eye exams. Stay current with your vaccines. Tell your health care provider if: You often feel depressed. You have ever been abused or do not feel safe at home. Summary Adopting a healthy lifestyle and getting preventive care are important in promoting health and wellness. Follow your health care provider's instructions about healthy diet,  exercising, and getting tested or screened for diseases. Follow your health care provider's instructions on monitoring your cholesterol and blood pressure. This information is not intended to replace advice given to you by your health care provider. Make sure you discuss any questions you have with your health care provider. Document Revised: 11/29/2020 Document Reviewed: 11/29/2020 Elsevier Patient Education  [place].

## 2021-06-30 ENCOUNTER — Ambulatory Visit (INDEPENDENT_AMBULATORY_CARE_PROVIDER_SITE_OTHER): Payer: PPO | Admitting: Internal Medicine

## 2021-06-30 ENCOUNTER — Other Ambulatory Visit: Payer: Self-pay

## 2021-06-30 VITALS — BP 130/90 | HR 58 | Temp 98.1°F | Ht 64.0 in | Wt 190.0 lb

## 2021-06-30 DIAGNOSIS — N3000 Acute cystitis without hematuria: Secondary | ICD-10-CM | POA: Diagnosis not present

## 2021-06-30 DIAGNOSIS — Z79890 Hormone replacement therapy (postmenopausal): Secondary | ICD-10-CM

## 2021-06-30 DIAGNOSIS — K219 Gastro-esophageal reflux disease without esophagitis: Secondary | ICD-10-CM | POA: Diagnosis not present

## 2021-06-30 DIAGNOSIS — R809 Proteinuria, unspecified: Secondary | ICD-10-CM

## 2021-06-30 DIAGNOSIS — F32A Depression, unspecified: Secondary | ICD-10-CM

## 2021-06-30 DIAGNOSIS — E782 Mixed hyperlipidemia: Secondary | ICD-10-CM | POA: Diagnosis not present

## 2021-06-30 DIAGNOSIS — M85859 Other specified disorders of bone density and structure, unspecified thigh: Secondary | ICD-10-CM | POA: Diagnosis not present

## 2021-06-30 DIAGNOSIS — Z0000 Encounter for general adult medical examination without abnormal findings: Secondary | ICD-10-CM | POA: Diagnosis not present

## 2021-06-30 DIAGNOSIS — R7303 Prediabetes: Secondary | ICD-10-CM

## 2021-06-30 DIAGNOSIS — R931 Abnormal findings on diagnostic imaging of heart and coronary circulation: Secondary | ICD-10-CM

## 2021-06-30 DIAGNOSIS — F419 Anxiety disorder, unspecified: Secondary | ICD-10-CM | POA: Diagnosis not present

## 2021-06-30 LAB — CBC WITH DIFFERENTIAL/PLATELET
Basophils Absolute: 0 10*3/uL (ref 0.0–0.1)
Basophils Relative: 0.4 % (ref 0.0–3.0)
Eosinophils Absolute: 0.2 10*3/uL (ref 0.0–0.7)
Eosinophils Relative: 2.2 % (ref 0.0–5.0)
HCT: 37.8 % (ref 36.0–46.0)
Hemoglobin: 12.2 g/dL (ref 12.0–15.0)
Lymphocytes Relative: 35.7 % (ref 12.0–46.0)
Lymphs Abs: 3.6 10*3/uL (ref 0.7–4.0)
MCHC: 32.4 g/dL (ref 30.0–36.0)
MCV: 91.6 fl (ref 78.0–100.0)
Monocytes Absolute: 0.9 10*3/uL (ref 0.1–1.0)
Monocytes Relative: 9 % (ref 3.0–12.0)
Neutro Abs: 5.4 10*3/uL (ref 1.4–7.7)
Neutrophils Relative %: 52.7 % (ref 43.0–77.0)
Platelets: 223 10*3/uL (ref 150.0–400.0)
RBC: 4.12 Mil/uL (ref 3.87–5.11)
RDW: 14.2 % (ref 11.5–15.5)
WBC: 10.1 10*3/uL (ref 4.0–10.5)

## 2021-06-30 LAB — URINALYSIS, ROUTINE W REFLEX MICROSCOPIC
Bilirubin Urine: NEGATIVE
Ketones, ur: NEGATIVE
Leukocytes,Ua: NEGATIVE
Nitrite: NEGATIVE
Specific Gravity, Urine: 1.025 (ref 1.000–1.030)
Total Protein, Urine: NEGATIVE
Urine Glucose: NEGATIVE
Urobilinogen, UA: 0.2 (ref 0.0–1.0)
pH: 6 (ref 5.0–8.0)

## 2021-06-30 LAB — COMPREHENSIVE METABOLIC PANEL
ALT: 12 U/L (ref 0–35)
AST: 16 U/L (ref 0–37)
Albumin: 3.8 g/dL (ref 3.5–5.2)
Alkaline Phosphatase: 50 U/L (ref 39–117)
BUN: 17 mg/dL (ref 6–23)
CO2: 28 mEq/L (ref 19–32)
Calcium: 9.1 mg/dL (ref 8.4–10.5)
Chloride: 103 mEq/L (ref 96–112)
Creatinine, Ser: 0.7 mg/dL (ref 0.40–1.20)
GFR: 88.72 mL/min (ref >60.00)
Glucose, Bld: 88 mg/dL (ref 70–99)
Potassium: 4 mEq/L (ref 3.5–5.1)
Sodium: 137 mEq/L (ref 135–145)
Total Bilirubin: 0.3 mg/dL (ref 0.2–1.2)
Total Protein: 6.8 g/dL (ref 6.0–8.3)

## 2021-06-30 LAB — LIPID PANEL
Cholesterol: 161 mg/dL (ref 0–200)
HDL: 61.8 mg/dL (ref >39.00)
LDL Cholesterol: 75 mg/dL (ref 0–99)
NonHDL: 98.79
Total CHOL/HDL Ratio: 3
Triglycerides: 117 mg/dL (ref 0.0–149.0)
VLDL: 23.4 mg/dL (ref 0.0–40.0)

## 2021-06-30 LAB — HEMOGLOBIN A1C: Hgb A1c MFr Bld: 5.5 % (ref 4.6–6.5)

## 2021-06-30 LAB — TSH: TSH: 2.12 u[IU]/mL (ref 0.35–5.50)

## 2021-06-30 NOTE — Assessment & Plan Note (Signed)
Chronic Check a1c Low sugar / carb diet Stressed regular exercise  

## 2021-06-30 NOTE — Assessment & Plan Note (Addendum)
Had recent UTI and there was evidence of proteinuria, which was likely from the infection She states she has had protein in her urine in the past Will check urinalysis today

## 2021-06-30 NOTE — Assessment & Plan Note (Addendum)
Chronic Asymptomatic Coronary calcium score of 180 in 2021-stable compared to 2014 Continue 81 mg of aspirin daily and Crestor 10 mg daily Encouraged regular exercise, healthy diet and weight loss

## 2021-06-30 NOTE — Assessment & Plan Note (Addendum)
Chronic DEXA up-to-date Stressed regular exercise Continue calcium, magnesium and vitamin D daily

## 2021-06-30 NOTE — Assessment & Plan Note (Addendum)
Chronic Still grieving for her father and brother and has a lot of stress recently Controlled, Stable-she does not feel that she needs an increase in her medication dose-she think she just needs time Deferred seeing a therapist Continue Lexapro 10 mg daily, clonazepam 0.5 mg twice daily as needed

## 2021-06-30 NOTE — Assessment & Plan Note (Addendum)
Chronic GERD controlled Continue omeprazole 40 mg daily

## 2021-06-30 NOTE — Assessment & Plan Note (Signed)
Chronic Regular exercise and healthy diet encouraged Check lipid panel, CMP Continue Crestor 10 mg daily

## 2021-06-30 NOTE — Assessment & Plan Note (Signed)
Chronic Continue estradiol 1.5 mg daily

## 2021-06-30 NOTE — Assessment & Plan Note (Signed)
Recent UTI Still has some urinary frequency, but no dysuria She is taking Azo urine health Will check UA, urine culture to make sure infection has completely cleared

## 2021-07-01 LAB — URINE CULTURE

## 2021-08-13 ENCOUNTER — Other Ambulatory Visit: Payer: Self-pay | Admitting: Internal Medicine

## 2021-08-20 ENCOUNTER — Other Ambulatory Visit: Payer: Self-pay | Admitting: Internal Medicine

## 2021-11-18 ENCOUNTER — Other Ambulatory Visit: Payer: Self-pay | Admitting: Internal Medicine

## 2021-12-05 ENCOUNTER — Other Ambulatory Visit: Payer: Self-pay | Admitting: Internal Medicine

## 2021-12-07 ENCOUNTER — Encounter: Payer: Self-pay | Admitting: Internal Medicine

## 2021-12-07 DIAGNOSIS — K219 Gastro-esophageal reflux disease without esophagitis: Secondary | ICD-10-CM

## 2021-12-07 DIAGNOSIS — K2970 Gastritis, unspecified, without bleeding: Secondary | ICD-10-CM

## 2021-12-08 MED ORDER — OMEPRAZOLE 40 MG PO CPDR: 40.0000 mg | DELAYED_RELEASE_CAPSULE | Freq: Every day | ORAL | 3 refills | Status: DC

## 2021-12-28 ENCOUNTER — Encounter: Payer: Self-pay | Admitting: Internal Medicine

## 2021-12-28 NOTE — Patient Instructions (Addendum)
     Blood work was ordered.     Medications changes include :   None      Return in about 6 months (around 06/30/2022) for Physical Exam.

## 2021-12-28 NOTE — Progress Notes (Signed)
Subjective:    Patient ID: Susan Norris, female    DOB: Mar 19, 1951, 71 y.o.   MRN: [id number]     HPI Susan Norris is here for follow up of her chronic medical problems, including hld, prediabetes, anxiety/depression, HRT, gerd  She is not eating right and not exercising (knee pain).  She has also had a lot of stress over the past year contributing to some increased depression.   Stopped omeprazole and had GERD recur - she did go back on it.    Medications and allergies reviewed with patient and updated if appropriate.  Current Outpatient Medications on File Prior to Visit  Medication Sig Dispense Refill   albuterol (VENTOLIN HFA) 108 (90 Base) MCG/ACT inhaler Inhale 2 puffs into the lungs every 2 (two) hours as needed for wheezing or shortness of breath (cough). 8 g 0   aspirin 81 MG EC tablet Take 81 mg by mouth daily. Swallow whole.     Cholecalciferol (VITAMIN D3) 50000 units CAPS Take one capsule by mouth twice weekly.  5   clonazePAM (KLONOPIN) 0.5 MG tablet Take 1 tablet (0.5 mg total) by mouth 2 (two) times daily as needed for anxiety. 60 tablet 2   fexofenadine (ALLEGRA) 180 MG tablet Take by mouth.     fluticasone (FLONASE) 50 MCG/ACT nasal spray Place 2 sprays into both nostrils daily. 9.9 g 0   magnesium gluconate (MAGONATE) 500 MG tablet Take 500 mg by mouth daily.     meclizine (ANTIVERT) 25 MG tablet Take 1 tablet (25 mg total) by mouth 3 (three) times daily as needed for dizziness. 30 tablet 2   phenazopyridine (PYRIDIUM) 200 MG tablet Take 1 tablet (200 mg total) by mouth 3 (three) times daily as needed for pain. 10 tablet 2   No current facility-administered medications on file prior to visit.     Review of Systems  Constitutional:  Negative for chills and fever.  Respiratory:  Negative for cough, shortness of breath and wheezing.   Cardiovascular:  Negative for chest pain, palpitations and leg swelling.  Gastrointestinal:  Negative for abdominal pain and nausea.   Neurological:  Negative for light-headedness and headaches.       Objective:   Vitals:   12/29/21 0906  BP: 128/80  Pulse: 66  Temp: 98 F (36.7 C)  SpO2: 96%   BP Readings from Last 3 Encounters:  12/29/21 128/80  06/30/21 130/90  05/23/21 120/74   Wt Readings from Last 3 Encounters:  12/29/21 193 lb (87.5 kg)  06/30/21 190 lb (86.2 kg)  05/23/21 186 lb (84.4 kg)   Body mass index is 33.13 kg/m.    Physical Exam Constitutional:      General: She is not in acute distress.    Appearance: Normal appearance.  HENT:     Head: Normocephalic and atraumatic.  Eyes:     Conjunctiva/sclera: Conjunctivae normal.  Cardiovascular:     Rate and Rhythm: Normal rate and regular rhythm.     Heart sounds: Normal heart sounds. No murmur heard. Pulmonary:     Effort: Pulmonary effort is normal. No respiratory distress.     Breath sounds: Normal breath sounds. No wheezing.  Musculoskeletal:     Cervical back: Neck supple.     Right lower leg: No edema.     Left lower leg: No edema.  Lymphadenopathy:     Cervical: No cervical adenopathy.  Skin:    General: Skin is warm and dry.  Findings: No rash.  Neurological:     Mental Status: She is alert. Mental status is at baseline.  Psychiatric:        Mood and Affect: Mood normal.        Behavior: Behavior normal.        Lab Results  Component Value Date   WBC 10.1 06/30/2021   HGB 12.2 06/30/2021   HCT 37.8 06/30/2021   PLT 223.0 06/30/2021   GLUCOSE 88 06/30/2021   CHOL 161 06/30/2021   TRIG 117.0 06/30/2021   HDL 61.80 06/30/2021   LDLCALC 75 06/30/2021   ALT 12 06/30/2021   AST 16 06/30/2021   NA 137 06/30/2021   K 4.0 06/30/2021   CL 103 06/30/2021   CREATININE 0.70 06/30/2021   BUN 17 06/30/2021   CO2 28 06/30/2021   TSH 2.12 06/30/2021   HGBA1C 5.5 06/30/2021     Assessment & Plan:    See Problem List for Assessment and Plan of chronic medical problems.

## 2021-12-29 ENCOUNTER — Other Ambulatory Visit: Payer: Self-pay

## 2021-12-29 ENCOUNTER — Ambulatory Visit (INDEPENDENT_AMBULATORY_CARE_PROVIDER_SITE_OTHER): Payer: PPO | Admitting: Internal Medicine

## 2021-12-29 VITALS — BP 128/80 | HR 66 | Temp 98.0°F | Ht 64.0 in | Wt 193.0 lb

## 2021-12-29 DIAGNOSIS — E782 Mixed hyperlipidemia: Secondary | ICD-10-CM

## 2021-12-29 DIAGNOSIS — K2970 Gastritis, unspecified, without bleeding: Secondary | ICD-10-CM

## 2021-12-29 DIAGNOSIS — K219 Gastro-esophageal reflux disease without esophagitis: Secondary | ICD-10-CM

## 2021-12-29 DIAGNOSIS — F419 Anxiety disorder, unspecified: Secondary | ICD-10-CM

## 2021-12-29 DIAGNOSIS — R7303 Prediabetes: Secondary | ICD-10-CM | POA: Diagnosis not present

## 2021-12-29 DIAGNOSIS — Z79890 Hormone replacement therapy (postmenopausal): Secondary | ICD-10-CM

## 2021-12-29 DIAGNOSIS — F32A Depression, unspecified: Secondary | ICD-10-CM

## 2021-12-29 LAB — COMPREHENSIVE METABOLIC PANEL
ALT: 13 U/L (ref 0–35)
AST: 18 U/L (ref 0–37)
Albumin: 4 g/dL (ref 3.5–5.2)
Alkaline Phosphatase: 67 U/L (ref 39–117)
BUN: 11 mg/dL (ref 6–23)
CO2: 29 mEq/L (ref 19–32)
Calcium: 9.8 mg/dL (ref 8.4–10.5)
Chloride: 100 mEq/L (ref 96–112)
Creatinine, Ser: 0.77 mg/dL (ref 0.40–1.20)
GFR: 78.85 mL/min (ref >60.00)
Glucose, Bld: 81 mg/dL (ref 70–99)
Potassium: 4.6 mEq/L (ref 3.5–5.1)
Sodium: 137 mEq/L (ref 135–145)
Total Bilirubin: 0.3 mg/dL (ref 0.2–1.2)
Total Protein: 7.3 g/dL (ref 6.0–8.3)

## 2021-12-29 LAB — LIPID PANEL
Cholesterol: 148 mg/dL (ref 0–200)
HDL: 64.4 mg/dL (ref >39.00)
LDL Cholesterol: 48 mg/dL (ref 0–99)
NonHDL: 84.03
Total CHOL/HDL Ratio: 2
Triglycerides: 181 mg/dL — ABNORMAL HIGH (ref 0.0–149.0)
VLDL: 36.2 mg/dL (ref 0.0–40.0)

## 2021-12-29 LAB — HEMOGLOBIN A1C: Hgb A1c MFr Bld: 5.9 % (ref 4.6–6.5)

## 2021-12-29 MED ORDER — ROSUVASTATIN CALCIUM 10 MG PO TABS: 10.0000 mg | ORAL_TABLET | Freq: Every day | ORAL | 2 refills | Status: DC

## 2021-12-29 MED ORDER — ESTRADIOL 1 MG PO TABS: 1.5000 mg | ORAL_TABLET | Freq: Every day | ORAL | 2 refills | Status: DC

## 2021-12-29 MED ORDER — OMEPRAZOLE 40 MG PO CPDR: 40.0000 mg | DELAYED_RELEASE_CAPSULE | Freq: Every day | ORAL | 2 refills | Status: DC

## 2021-12-29 MED ORDER — ESCITALOPRAM OXALATE 10 MG PO TABS: 10.0000 mg | ORAL_TABLET | Freq: Every day | ORAL | 2 refills | Status: DC

## 2021-12-29 NOTE — Assessment & Plan Note (Addendum)
Chronic GERD controlled Continue omeprazole 40 mg daily - will try to taper off slowly

## 2021-12-29 NOTE — Assessment & Plan Note (Addendum)
Chronic Controlled, Stable Continue Lexapro 10 mg daily, clonazepam 0.5 mg twice daily as needed - does not take it much

## 2021-12-29 NOTE — Assessment & Plan Note (Signed)
Chronic Discussed risks/benefits of HRT Discussed possibly decreasing dose Continue estradiol 1.5 mg daily

## 2021-12-29 NOTE — Assessment & Plan Note (Signed)
Chronic Check a1c Low sugar / carb diet Stressed regular exercise  

## 2021-12-29 NOTE — Assessment & Plan Note (Signed)
Chronic Regular exercise and healthy diet encouraged Check lipid panel  Continue Crestor 10 mg daily

## 2022-03-15 DIAGNOSIS — M25561 Pain in right knee: Secondary | ICD-10-CM | POA: Diagnosis not present

## 2022-03-21 ENCOUNTER — Ambulatory Visit: Payer: PPO | Admitting: Orthopaedic Surgery

## 2022-04-05 DIAGNOSIS — M87052 Idiopathic aseptic necrosis of left femur: Secondary | ICD-10-CM | POA: Diagnosis not present

## 2022-04-05 DIAGNOSIS — M1711 Unilateral primary osteoarthritis, right knee: Secondary | ICD-10-CM | POA: Diagnosis not present

## 2022-04-06 ENCOUNTER — Encounter: Payer: Self-pay | Admitting: Internal Medicine

## 2022-04-07 ENCOUNTER — Encounter: Payer: Self-pay | Admitting: Nurse Practitioner

## 2022-04-07 ENCOUNTER — Telehealth (INDEPENDENT_AMBULATORY_CARE_PROVIDER_SITE_OTHER): Payer: PPO | Admitting: Nurse Practitioner

## 2022-04-07 ENCOUNTER — Other Ambulatory Visit: Payer: Self-pay | Admitting: Nurse Practitioner

## 2022-04-07 DIAGNOSIS — U071 COVID-19: Secondary | ICD-10-CM

## 2022-04-07 MED ORDER — BENZONATATE 100 MG PO CAPS: 200.0000 mg | ORAL_CAPSULE | Freq: Two times a day (BID) | ORAL | 1 refills | Status: DC | PRN

## 2022-04-07 MED ORDER — PREDNISONE 20 MG PO TABS: 20.0000 mg | ORAL_TABLET | Freq: Every day | ORAL | 0 refills | Status: DC

## 2022-04-07 MED ORDER — NIRMATRELVIR/RITONAVIR (PAXLOVID)TABLET: 3.0000 | ORAL_TABLET | Freq: Two times a day (BID) | ORAL | 0 refills | Status: AC

## 2022-04-07 NOTE — Progress Notes (Signed)
   Established Patient Office Visit  An audio-only tele-health visit was completed today for this patient. I connected with  Susan Norris on 04/07/22 utilizing audio-only technology and verified that I am speaking with the correct person using two identifiers. The patient was located at their home, and I was located at the office of [place] at [facility] during the encounter. I discussed the limitations of evaluation and management by telemedicine. The patient expressed understanding and agreed to proceed.     Subjective   Patient ID: Susan Norris, female    DOB: July 21, 1951  Age: 71 y.o. MRN: [id number]  Chief Complaint  Patient presents with   covid 19    Patient arrives for virtual visit for the above.  Symptoms started yesterday had 2 positive COVID-19 test today.  She has been vaccinated with Moderna x3.  Most recent GFR 78 (collected 3 months ago), is on Klonopin as needed but reports not having taken it in quite some time.  Is also on rosuvastatin.  Has taken Paxlovid in the past for previous COVID-19 infection and tolerated it well.  Has taken Delsym, Mucinex, Tessalon Perles, Tylenol, and Flonase to manage symptoms.    Review of Systems  Constitutional:  Positive for fever.  HENT:  Positive for congestion.   Respiratory:  Positive for cough and sputum production. Negative for shortness of breath.   Cardiovascular:  Negative for chest pain and palpitations.  Gastrointestinal:  Negative for diarrhea, nausea and vomiting.  Musculoskeletal:  Positive for myalgias.  Neurological:  Negative for headaches.      Objective:     There were no vitals taken for this visit.   Physical Exam Comprehensive physical exam not completed today as office visit was conducted remotely.  Patient coughed frequently over the phone, she was able to speak in complete sentences without having to stop to breathe and did not appear to be in distress.  Patient was alert and oriented, and  appeared to have appropriate judgment.   No results found for any visits on 04/07/22.    The 10-year ASCVD risk score (Arnett DK, et al., 2019) is: 7.4%    Assessment & Plan:   Problem List Items Addressed This Visit       Other   COVID-19 - Primary    Acute, candidate for antiviral therapy.  We had shared decision making regarding Paxlovid versus moment..  Patient would like to take Paxlovid, educated to avoid Klonopin as well as to stop rosuvastatin while taking Paxlovid.  Both medications can be restarted upon completion of Paxlovid.  Most recent EGFR greater than 60.  Prescription sent to patient's pharmacy.  Also sent prescription of Ladona Ridgel to pharmacy today.  Patient encouraged to continue using over-the-counter medications such as Delsym, Mucinex, Tylenol, and Flonase as needed for symptom management.  Educated on isolation and quarantine guidelines.  Patient reports understanding.  She was educated that she would need to go to the emergency department if she experiences worsening shortness of breath.  Patient reports understanding.      Relevant Medications   nirmatrelvir/ritonavir EUA (PAXLOVID) 20 x 150 MG & 10 x 100MG TABS   benzonatate (TESSALON) 100 MG capsule     Return if symptoms worsen or fail to improve.  Total time spent with telephone was 15 minutes and 25 seconds.   Ailene Ards, NP

## 2022-04-07 NOTE — Assessment & Plan Note (Signed)
Acute, candidate for antiviral therapy.  We had shared decision making regarding Paxlovid versus moment..  Patient would like to take Paxlovid, educated to avoid Klonopin as well as to stop rosuvastatin while taking Paxlovid.  Both medications can be restarted upon completion of Paxlovid.  Most recent EGFR greater than 60.  Prescription sent to patient's pharmacy.  Also sent prescription of Ladona Ridgel to pharmacy today.  Patient encouraged to continue using over-the-counter medications such as Delsym, Mucinex, Tylenol, and Flonase as needed for symptom management.  Educated on isolation and quarantine guidelines.  Patient reports understanding.  She was educated that she would need to go to the emergency department if she experiences worsening shortness of breath.  Patient reports understanding.

## 2022-05-17 ENCOUNTER — Encounter: Payer: Self-pay | Admitting: Internal Medicine

## 2022-05-17 DIAGNOSIS — M1711 Unilateral primary osteoarthritis, right knee: Secondary | ICD-10-CM | POA: Diagnosis not present

## 2022-05-17 DIAGNOSIS — M1612 Unilateral primary osteoarthritis, left hip: Secondary | ICD-10-CM | POA: Diagnosis not present

## 2022-05-19 ENCOUNTER — Ambulatory Visit: Payer: PPO

## 2022-05-24 DIAGNOSIS — M1711 Unilateral primary osteoarthritis, right knee: Secondary | ICD-10-CM | POA: Diagnosis not present

## 2022-05-25 DIAGNOSIS — H5213 Myopia, bilateral: Secondary | ICD-10-CM | POA: Diagnosis not present

## 2022-05-25 DIAGNOSIS — H26491 Other secondary cataract, right eye: Secondary | ICD-10-CM | POA: Diagnosis not present

## 2022-05-26 ENCOUNTER — Ambulatory Visit: Payer: PPO

## 2022-05-26 ENCOUNTER — Telehealth: Payer: Self-pay

## 2022-05-26 NOTE — Telephone Encounter (Signed)
Called patient lvm to return call, to complete AWV. Patient may reschedule for the next available appointment [facility] or CMA. -S. Ha Placeres,LPN 

## 2022-05-31 DIAGNOSIS — M1711 Unilateral primary osteoarthritis, right knee: Secondary | ICD-10-CM | POA: Diagnosis not present

## 2022-06-02 ENCOUNTER — Ambulatory Visit (INDEPENDENT_AMBULATORY_CARE_PROVIDER_SITE_OTHER): Payer: PPO

## 2022-06-02 VITALS — Ht 64.0 in

## 2022-06-02 DIAGNOSIS — Z0000 Encounter for general adult medical examination without abnormal findings: Secondary | ICD-10-CM | POA: Diagnosis not present

## 2022-06-02 NOTE — Progress Notes (Signed)
Virtual Visit via Telephone Note  I connected with  Susan Norris on 06/02/22 at 11:15 AM EST by telephone and verified that I am speaking with the correct person using two identifiers.  Location: Patient: Home Provider: [place] Persons participating in the virtual visit: [name]   I discussed the limitations, risks, security and privacy concerns of performing an evaluation and management service by telephone and the availability of in person appointments. The patient expressed understanding and agreed to proceed.  Interactive audio and video telecommunications were attempted between this nurse and patient, however failed, due to patient having technical difficulties OR patient did not have access to video capability.  We continued and completed visit with audio only.  Some vital signs may be absent or patient reported.   Sheral Flow, LPN  Subjective:   Susan Norris is a 71 y.o. female who presents for Medicare Annual (Subsequent) preventive examination.  Review of Systems     Cardiac Risk Factors include: advanced age (>65mn, >>53women);dyslipidemia;family history of premature cardiovascular disease;sedentary lifestyle;obesity (BMI >30kg/m2)     Objective:    Today's Vitals   06/02/22 1127  Height: 5' 4"  (1.626 m)  PainSc: 0-No pain   Body mass index is 33.13 kg/m.     06/02/2022   11:19 AM 05/12/2021    9:26 AM  Advanced Directives  Does Patient Have a Medical Advance Directive? No Yes  Type of Advance Directive  Living will;Healthcare Power of Attorney  Does patient want to make changes to medical advance directive?  No - Patient declined  Copy of [place]?  No - copy requested  Would patient like information on creating a medical advance directive? No - Patient declined     Current Medications (verified) Outpatient Encounter Medications as of 06/02/2022  Medication Sig   albuterol (VENTOLIN HFA)  108 (90 Base) MCG/ACT inhaler Inhale 2 puffs into the lungs every 2 (two) hours as needed for wheezing or shortness of breath (cough).   aspirin 81 MG EC tablet Take 81 mg by mouth daily. Swallow whole.   benzonatate (TESSALON) 100 MG capsule Take 2 capsules (200 mg total) by mouth 2 (two) times daily as needed for cough.   Cholecalciferol (VITAMIN D3) 50000 units CAPS Take one capsule by mouth twice weekly.   clonazePAM (KLONOPIN) 0.5 MG tablet Take 1 tablet (0.5 mg total) by mouth 2 (two) times daily as needed for anxiety.   escitalopram (LEXAPRO) 10 MG tablet Take 1 tablet (10 mg total) by mouth daily.   estradiol (ESTRACE) 1 MG tablet Take 1.5 tablets (1.5 mg total) by mouth daily.   fexofenadine (ALLEGRA) 180 MG tablet Take by mouth.   fluticasone (FLONASE) 50 MCG/ACT nasal spray Place 2 sprays into both nostrils daily.   magnesium gluconate (MAGONATE) 500 MG tablet Take 500 mg by mouth daily.   meclizine (ANTIVERT) 25 MG tablet Take 1 tablet (25 mg total) by mouth 3 (three) times daily as needed for dizziness.   omeprazole (PRILOSEC) 40 MG capsule Take 1 capsule (40 mg total) by mouth daily.   phenazopyridine (PYRIDIUM) 200 MG tablet Take 1 tablet (200 mg total) by mouth 3 (three) times daily as needed for pain.   predniSONE (DELTASONE) 20 MG tablet Take 1 tablet (20 mg total) by mouth daily with breakfast.   rosuvastatin (CRESTOR) 10 MG tablet Take 1 tablet (10 mg total) by mouth daily.   No facility-administered encounter medications on file as of  06/02/2022.    Allergies (verified) Codeine   History: Past Medical History:  Diagnosis Date   Allergic rhinitis    Allergy    Allergy-induced asthma    Anxiety    Arthritis    knees    Asthma    Blood transfusion without reported diagnosis    at 71yo   CAD (coronary artery disease)    Cataract    removed bilateral eyes   COVID 12/21/2020   Crohn's colitis ([place])    Depression    Fatty liver    GERD (gastroesophageal reflux  disease)    History of chicken pox    History of shingles    HLD (hyperlipidemia)    Hyperlipidemia    Neoplasm of tongue    Osteopenia    Prediabetes    Rectocele    Sensorineural hearing loss    Vertigo    Vitamin D deficiency    Past Surgical History:  Procedure Laterality Date   ABDOMINAL HYSTERECTOMY     total   CESAREAN SECTION     COLONOSCOPY  10/2015   completed in [facility]    OOPHORECTOMY Left    TONSILLECTOMY AND ADENOIDECTOMY     UPPER GASTROINTESTINAL ENDOSCOPY     Family History  Problem Relation Age of Onset   Diabetes Mother    Atrial fibrillation Mother    Thyroid disease Mother    Arthritis Father    Asthma Father    Heart disease Father    Hyperlipidemia Father    Hypertension Father    Kidney disease Father    Diabetes Brother    Hypertension Brother    Depression Maternal Grandmother    Heart disease Maternal Grandfather    Hypertension Paternal Grandfather    Stroke Paternal Grandfather    Breast cancer Neg Hx    Colon cancer Neg Hx    Esophageal cancer Neg Hx    Rectal cancer Neg Hx    Stomach cancer Neg Hx    Social History   Socioeconomic History   Marital status: Married    Spouse name: Not on file   Number of children: Not on file   Years of education: Not on file   Highest education level: Not on file  Occupational History   Not on file  Tobacco Use   Smoking status: Never   Smokeless tobacco: Never  Vaping Use   Vaping Use: Never used  Substance and Sexual Activity   Alcohol use: No   Drug use: No   Sexual activity: Yes  Other Topics Concern   Not on file  Social History Narrative   Not on file   Social Determinants of Health   Financial Resource Strain: Low Risk  (06/02/2022)   Overall Financial Resource Strain (CARDIA)    Difficulty of Paying Living Expenses: Not hard at all  Food Insecurity: No Food Insecurity (06/02/2022)   Hunger Vital Sign    Worried About Estate manager/land agent of Food in the Last Year: Never true    Ran  Out of Food in the Last Year: Never true  Transportation Needs: No Transportation Needs (06/02/2022)   PRAPARE - Hydrologist (Medical): No    Lack of Transportation (Non-Medical): No  Physical Activity: Inactive (06/02/2022)   Exercise Vital Sign    Days of Exercise per Week: 0 days    Minutes of Exercise per Session: 0 min  Stress: No Stress Concern Present (06/02/2022)   [employer] -  Occupational Stress Questionnaire    Feeling of Stress : Not at all  Social Connections: Socially Integrated (06/02/2022)   Social Connection and Isolation Panel [NHANES]    Frequency of Communication with Friends and Family: More than three times a week    Frequency of Social Gatherings with Friends and Family: More than three times a week    Attends Religious Services: More than 4 times per year    Active Member of [employer] or Organizations: Yes    Attends Music therapist: More than 4 times per year    Marital Status: Married    Tobacco Counseling Counseling given: Not Answered   Clinical Intake:  Pre-visit preparation completed: Yes  Pain : No/denies pain Pain Score: 0-No pain     BMI - recorded: 33.13 (12/29/2021) Nutritional Status: BMI > 30  Obese Nutritional Risks: None Diabetes: No  How often do you need to have someone help you when you read instructions, pamphlets, or other written materials from your doctor or pharmacy?: 1 - Never What is the last grade level you completed in school?: HSG; some college  Diabetic? no  Interpreter Needed?: No  Information entered by :: Lisette Abu, LPN.   Activities of Daily Living    06/02/2022   11:30 AM  In your present state of health, do you have any difficulty performing the following activities:  Hearing? 0  Vision? 0  Difficulty concentrating or making decisions? 0  Walking or climbing stairs? 0  Dressing or bathing? 0  Doing errands, shopping? 0   Preparing Food and eating ? N  Using the Toilet? N  In the past six months, have you accidently leaked urine? Y  Comment at night only; wear mini pad  Do you have problems with loss of bowel control? N  Managing your Medications? N  Managing your Finances? N  Housekeeping or managing your Housekeeping? N    Patient Care Team: Binnie Rail, MD as PCP - General (Internal Medicine) Jola Schmidt, MD as Consulting Physician (Ophthalmology)  Indicate any recent Medical Services you may have received from other than [facility] providers in the past year (date may be approximate).     Assessment:   This is a routine wellness examination for Susan Norris.  Hearing/Vision screen Hearing Screening - Comments:: Denies hearing difficulties   Vision Screening - Comments:: Wears rx glasses for driving only - up to date with routine eye exams with Jola Schmidt, MD.   Dietary issues and exercise activities discussed: Current Exercise Habits: The patient does not participate in regular exercise at present   Goals Addressed             This Visit's Progress    My goal for 2024 is to maintain my health by staying healthy.        Depression Screen    06/02/2022   11:29 AM 12/29/2021    9:22 AM 05/12/2021    9:30 AM 11/14/2019   10:32 AM 10/10/2018    8:55 AM 04/26/2017   11:45 AM  PHQ 2/9 Scores  PHQ - 2 Score 0 3 0 1 0 0  PHQ- 9 Score  4  5 0     Fall Risk    06/02/2022   11:19 AM 12/29/2021    9:22 AM 05/12/2021    9:29 AM 11/13/2019   10:32 AM 10/10/2018    8:55 AM  Fall Risk   Falls in the past year? 0 0 0 0 0  Number falls  in past yr: 0 0 0 0   Injury with Fall? 0 0 0 0   Risk for fall due to : No Fall Risks No Fall Risks No Fall Risks    Follow up Falls prevention discussed Falls evaluation completed Falls evaluation completed Falls evaluation completed     [place] Falls:  Any stairs in or around the home? No  If so, are there any without  handrails? No  Home free of loose throw rugs in walkways, pet beds, electrical cords, etc? Yes  Adequate lighting in your home to reduce risk of falls? Yes   ASSISTIVE DEVICES UTILIZED TO PREVENT FALLS:  Life alert? No  Use of a cane, walker or w/c? No  Grab bars in the bathroom? Yes  Shower chair or bench in shower? Yes  Elevated toilet seat or a handicapped toilet? Yes   TIMED UP AND GO:  Was the test performed? No . Phone Visit  Cognitive Function:        06/02/2022   11:31 AM  6CIT Screen  What Year? 0 points  What month? 0 points  What time? 0 points  Count back from 20 0 points  Months in reverse 0 points  Repeat phrase 0 points  Total Score 0 points    Immunizations Immunization History  Administered Date(s) Administered   Fluad Quad(high Dose 65+) 03/29/2019, 05/12/2021, 05/17/2022   Influenza, High Dose Seasonal PF 03/31/2018   Influenza,inj,Quad PF,6+ Mos 04/26/2017   Influenza-Unspecified 03/31/2020   Moderna Sars-Covid-2 Vaccination 09/18/2019, 10/20/2019, 06/01/2020   Pneumococcal Conjugate-13 03/31/2018   Pneumococcal Polysaccharide-23 03/29/2019   Tdap 07/25/2015   Zoster Recombinat (Shingrix) 03/31/2020, 10/08/2020    TDAP status: Up to date  Flu Vaccine status: Up to date  Pneumococcal vaccine status: Up to date  Covid-19 vaccine status: Completed vaccines  Qualifies for Shingles Vaccine? Yes   Zostavax completed No   Shingrix Completed?: Yes  Screening Tests Health Maintenance  Topic Date Due   COVID-19 Vaccine (4 - Moderna series) 07/27/2020   DEXA SCAN  04/22/2023   Medicare Annual Wellness (AWV)  06/03/2023   MAMMOGRAM  06/24/2023   TETANUS/TDAP  07/24/2025   COLONOSCOPY (Pts 45-36yr Insurance coverage will need to be confirmed)  03/19/2027   Pneumonia Vaccine 71 Years old  Completed   INFLUENZA VACCINE  Completed   Hepatitis C Screening  Completed   Zoster Vaccines- Shingrix  Completed   HPV VACCINES  Aged Out    Health  Maintenance  Health Maintenance Due  Topic Date Due   COVID-19 Vaccine (4 - Moderna series) 07/27/2020    Colorectal cancer screening: Type of screening: Colonoscopy. Completed 03/18/2020. Repeat every 7 years  Mammogram status: Completed 06/23/2021. Repeat every year  Bone Density status: Completed 04/21/2020. Results reflect: Bone density results: OSTEOPENIA. Repeat every 3 years.  Lung Cancer Screening: (Low Dose CT Chest recommended if Age 69-80years, 30 pack-year currently smoking OR have quit w/in 15years.) does not qualify.   Lung Cancer Screening Referral: no  Additional Screening:  Hepatitis C Screening: does qualify; Completed 10/10/2018  Vision Screening: Recommended annual ophthalmology exams for early detection of glaucoma and other disorders of the eye. Is the patient up to date with their annual eye exam?  Yes  Who is the provider or what is the name of the office in which the patient attends annual eye exams? BJola Schmidt MD. If pt is not established with a provider, would they like to be referred  to a provider to establish care? No .   Dental Screening: Recommended annual dental exams for proper oral hygiene  Community Resource Referral / Chronic Care Management: CRR required this visit?  No   CCM required this visit?  No      Plan:     I have personally reviewed and noted the following in the patient's chart:   Medical and social history Use of alcohol, tobacco or illicit drugs  Current medications and supplements including opioid prescriptions. Patient is not currently taking opioid prescriptions. Functional ability and status Nutritional status Physical activity Advanced directives List of other physicians Hospitalizations, surgeries, and ER visits in previous 12 months Vitals Screenings to include cognitive, depression, and falls Referrals and appointments  In addition, I have reviewed and discussed with patient certain preventive protocols,  quality metrics, and best practice recommendations. A written personalized care plan for preventive services as well as general preventive health recommendations were provided to patient.     Sheral Flow, LPN   [id number]   Nurse Notes: N/A

## 2022-06-02 NOTE — Patient Instructions (Signed)
Ms. Susan Norris , Thank you for taking time to come for your Medicare Wellness Visit. I appreciate your ongoing commitment to your health goals. Please review the following plan we discussed and let me know if I can assist you in the future.   These are the goals we discussed:  Goals      My goal for 2024 is to maintain my health by staying healthy.        This is a list of the screening recommended for you and due dates:  Health Maintenance  Topic Date Due   COVID-19 Vaccine (4 - Moderna series) 07/27/2020   DEXA scan (bone density measurement)  04/22/2023   Medicare Annual Wellness Visit  06/03/2023   Mammogram  06/24/2023   Tetanus Vaccine  07/24/2025   Colon Cancer Screening  03/19/2027   Pneumonia Vaccine  Completed   Flu Shot  Completed   Hepatitis C Screening: USPSTF Recommendation to screen - Ages 18-79 yo.  Completed   Zoster (Shingles) Vaccine  Completed   HPV Vaccine  Aged Out    Advanced directives: No  Conditions/risks identified: Yes  Next appointment: Follow up in one year for your annual wellness visit.   Preventive Care 30 Years and Older, Female Preventive care refers to lifestyle choices and visits with your health care provider that can promote health and wellness. What does preventive care include? A yearly physical exam. This is also called an annual well check. Dental exams once or twice a year. Routine eye exams. Ask your health care provider how often you should have your eyes checked. Personal lifestyle choices, including: Daily care of your teeth and gums. Regular physical activity. Eating a healthy diet. Avoiding tobacco and drug use. Limiting alcohol use. Practicing safe sex. Taking low-dose aspirin every day. Taking vitamin and mineral supplements as recommended by your health care provider. What happens during an annual well check? The services and screenings done by your health care provider during your annual well check will depend on your  age, overall health, lifestyle risk factors, and family history of disease. Counseling  Your health care provider may ask you questions about your: Alcohol use. Tobacco use. Drug use. Emotional well-being. Home and relationship well-being. Sexual activity. Eating habits. History of falls. Memory and ability to understand (cognition). Work and work Statistician. Reproductive health. Screening  You may have the following tests or measurements: Height, weight, and BMI. Blood pressure. Lipid and cholesterol levels. These may be checked every 5 years, or more frequently if you are over 27 years old. Skin check. Lung cancer screening. You may have this screening every year starting at age 6 if you have a 30-pack-year history of smoking and currently smoke or have quit within the past 15 years. Fecal occult blood test (FOBT) of the stool. You may have this test every year starting at age 40. Flexible sigmoidoscopy or colonoscopy. You may have a sigmoidoscopy every 5 years or a colonoscopy every 10 years starting at age 63. Hepatitis C blood test. Hepatitis B blood test. Sexually transmitted disease (STD) testing. Diabetes screening. This is done by checking your blood sugar (glucose) after you have not eaten for a while (fasting). You may have this done every 1-3 years. Bone density scan. This is done to screen for osteoporosis. You may have this done starting at age 32. Mammogram. This may be done every 1-2 years. Talk to your health care provider about how often you should have regular mammograms. Talk with your health care  provider about your test results, treatment options, and if necessary, the need for more tests. Vaccines  Your health care provider may recommend certain vaccines, such as: Influenza vaccine. This is recommended every year. Tetanus, diphtheria, and acellular pertussis (Tdap, Td) vaccine. You may need a Td booster every 10 years. Zoster vaccine. You may need this after  age 58. Pneumococcal 13-valent conjugate (PCV13) vaccine. One dose is recommended after age 26. Pneumococcal polysaccharide (PPSV23) vaccine. One dose is recommended after age 57. Talk to your health care provider about which screenings and vaccines you need and how often you need them. This information is not intended to replace advice given to you by your health care provider. Make sure you discuss any questions you have with your health care provider. Document Released: 08/06/2015 Document Revised: 03/29/2016 Document Reviewed: 05/11/2015 Elsevier Interactive Patient Education  2017 [place] Prevention in the Home Falls can cause injuries. They can happen to people of all ages. There are many things you can do to make your home safe and to help prevent falls. What can I do on the outside of my home? Regularly fix the edges of walkways and driveways and fix any cracks. Remove anything that might make you trip as you walk through a door, such as a raised step or threshold. Trim any bushes or trees on the path to your home. Use bright outdoor lighting. Clear any walking paths of anything that might make someone trip, such as rocks or tools. Regularly check to see if handrails are loose or broken. Make sure that both sides of any steps have handrails. Any raised decks and porches should have guardrails on the edges. Have any leaves, snow, or ice cleared regularly. Use sand or salt on walking paths during winter. Clean up any spills in your garage right away. This includes oil or grease spills. What can I do in the bathroom? Use night lights. Install grab bars by the toilet and in the tub and shower. Do not use towel bars as grab bars. Use non-skid mats or decals in the tub or shower. If you need to sit down in the shower, use a plastic, non-slip stool. Keep the floor dry. Clean up any water that spills on the floor as soon as it happens. Remove soap buildup in the tub or shower  regularly. Attach bath mats securely with double-sided non-slip rug tape. Do not have throw rugs and other things on the floor that can make you trip. What can I do in the bedroom? Use night lights. Make sure that you have a light by your bed that is easy to reach. Do not use any sheets or blankets that are too big for your bed. They should not hang down onto the floor. Have a firm chair that has side arms. You can use this for support while you get dressed. Do not have throw rugs and other things on the floor that can make you trip. What can I do in the kitchen? Clean up any spills right away. Avoid walking on wet floors. Keep items that you use a lot in easy-to-reach places. If you need to reach something above you, use a strong step stool that has a grab bar. Keep electrical cords out of the way. Do not use floor polish or wax that makes floors slippery. If you must use wax, use non-skid floor wax. Do not have throw rugs and other things on the floor that can make you trip. What can I  do with my stairs? Do not leave any items on the stairs. Make sure that there are handrails on both sides of the stairs and use them. Fix handrails that are broken or loose. Make sure that handrails are as long as the stairways. Check any carpeting to make sure that it is firmly attached to the stairs. Fix any carpet that is loose or worn. Avoid having throw rugs at the top or bottom of the stairs. If you do have throw rugs, attach them to the floor with carpet tape. Make sure that you have a light switch at the top of the stairs and the bottom of the stairs. If you do not have them, ask someone to add them for you. What else can I do to help prevent falls? Wear shoes that: Do not have high heels. Have rubber bottoms. Are comfortable and fit you well. Are closed at the toe. Do not wear sandals. If you use a stepladder: Make sure that it is fully opened. Do not climb a closed stepladder. Make sure that  both sides of the stepladder are locked into place. Ask someone to hold it for you, if possible. Clearly mark and make sure that you can see: Any grab bars or handrails. First and last steps. Where the edge of each step is. Use tools that help you move around (mobility aids) if they are needed. These include: Canes. Walkers. Scooters. Crutches. Turn on the lights when you go into a dark area. Replace any light bulbs as soon as they burn out. Set up your furniture so you have a clear path. Avoid moving your furniture around. If any of your floors are uneven, fix them. If there are any pets around you, be aware of where they are. Review your medicines with your doctor. Some medicines can make you feel dizzy. This can increase your chance of falling. Ask your doctor what other things that you can do to help prevent falls. This information is not intended to replace advice given to you by your health care provider. Make sure you discuss any questions you have with your health care provider. Document Released: 05/06/2009 Document Revised: 12/16/2015 Document Reviewed: 08/14/2014 Elsevier Interactive Patient Education  2017 [employer].

## 2022-06-12 DIAGNOSIS — Y998 Other external cause status: Secondary | ICD-10-CM | POA: Diagnosis not present

## 2022-06-12 DIAGNOSIS — M25552 Pain in left hip: Secondary | ICD-10-CM | POA: Diagnosis not present

## 2022-06-12 DIAGNOSIS — S76212A Strain of adductor muscle, fascia and tendon of left thigh, initial encounter: Secondary | ICD-10-CM | POA: Diagnosis not present

## 2022-06-12 DIAGNOSIS — M1612 Unilateral primary osteoarthritis, left hip: Secondary | ICD-10-CM | POA: Diagnosis not present

## 2022-06-12 DIAGNOSIS — W1789XA Other fall from one level to another, initial encounter: Secondary | ICD-10-CM | POA: Diagnosis not present

## 2022-06-19 DIAGNOSIS — M1711 Unilateral primary osteoarthritis, right knee: Secondary | ICD-10-CM | POA: Diagnosis not present

## 2022-06-19 DIAGNOSIS — M545 Low back pain, unspecified: Secondary | ICD-10-CM | POA: Diagnosis not present

## 2022-06-27 DIAGNOSIS — M25552 Pain in left hip: Secondary | ICD-10-CM | POA: Diagnosis not present

## 2022-07-03 DIAGNOSIS — M25552 Pain in left hip: Secondary | ICD-10-CM | POA: Diagnosis not present

## 2022-07-04 ENCOUNTER — Encounter: Payer: PPO | Admitting: Internal Medicine

## 2022-07-08 ENCOUNTER — Encounter: Payer: Self-pay | Admitting: Internal Medicine

## 2022-07-09 MED ORDER — LIDOCAINE 5 % EX PTCH: 1.0000 | MEDICATED_PATCH | CUTANEOUS | 1 refills | Status: DC

## 2022-07-11 ENCOUNTER — Telehealth: Payer: Self-pay

## 2022-07-11 NOTE — Telephone Encounter (Signed)
PA denied for lidocaine on 07/10/2022

## 2022-07-12 ENCOUNTER — Encounter: Payer: Self-pay | Admitting: Internal Medicine

## 2022-07-12 MED ORDER — FLUCONAZOLE 150 MG PO TABS: 150.0000 mg | ORAL_TABLET | Freq: Once | ORAL | 0 refills | Status: AC

## 2022-07-25 ENCOUNTER — Encounter: Payer: Self-pay | Admitting: Internal Medicine

## 2022-07-25 DIAGNOSIS — M1612 Unilateral primary osteoarthritis, left hip: Secondary | ICD-10-CM | POA: Insufficient documentation

## 2022-07-28 DIAGNOSIS — M6281 Muscle weakness (generalized): Secondary | ICD-10-CM | POA: Diagnosis not present

## 2022-07-28 DIAGNOSIS — M5416 Radiculopathy, lumbar region: Secondary | ICD-10-CM | POA: Diagnosis not present

## 2022-07-28 DIAGNOSIS — M1612 Unilateral primary osteoarthritis, left hip: Secondary | ICD-10-CM | POA: Diagnosis not present

## 2022-07-28 DIAGNOSIS — R269 Unspecified abnormalities of gait and mobility: Secondary | ICD-10-CM | POA: Diagnosis not present

## 2022-07-31 DIAGNOSIS — R269 Unspecified abnormalities of gait and mobility: Secondary | ICD-10-CM | POA: Diagnosis not present

## 2022-07-31 DIAGNOSIS — M6281 Muscle weakness (generalized): Secondary | ICD-10-CM | POA: Diagnosis not present

## 2022-07-31 DIAGNOSIS — M1612 Unilateral primary osteoarthritis, left hip: Secondary | ICD-10-CM | POA: Diagnosis not present

## 2022-07-31 DIAGNOSIS — M5416 Radiculopathy, lumbar region: Secondary | ICD-10-CM | POA: Diagnosis not present

## 2022-08-04 DIAGNOSIS — M1612 Unilateral primary osteoarthritis, left hip: Secondary | ICD-10-CM | POA: Diagnosis not present

## 2022-08-04 DIAGNOSIS — M5416 Radiculopathy, lumbar region: Secondary | ICD-10-CM | POA: Diagnosis not present

## 2022-08-04 DIAGNOSIS — M6281 Muscle weakness (generalized): Secondary | ICD-10-CM | POA: Diagnosis not present

## 2022-08-04 DIAGNOSIS — R269 Unspecified abnormalities of gait and mobility: Secondary | ICD-10-CM | POA: Diagnosis not present

## 2022-09-03 ENCOUNTER — Other Ambulatory Visit: Payer: Self-pay | Admitting: Internal Medicine

## 2022-09-06 ENCOUNTER — Other Ambulatory Visit: Payer: Self-pay | Admitting: Internal Medicine

## 2022-09-06 DIAGNOSIS — Z1231 Encounter for screening mammogram for malignant neoplasm of breast: Secondary | ICD-10-CM

## 2022-09-19 ENCOUNTER — Ambulatory Visit: Payer: PPO

## 2022-09-21 IMAGING — MG MM DIGITAL SCREENING BILAT W/ TOMO AND CAD
8 series · 8 of 24 positions shown · non-contrast
Comparison: Previous exam(s).

CLINICAL DATA: Screening.

EXAM:
DIGITAL SCREENING BILATERAL MAMMOGRAM WITH TOMOSYNTHESIS AND CAD
TECHNIQUE: Bilateral screening digital craniocaudal and mediolateral oblique
mammograms were obtained. Bilateral screening digital breast
tomosynthesis was performed. The images were evaluated with
computer-aided detection.

[R MLO synth-2D]
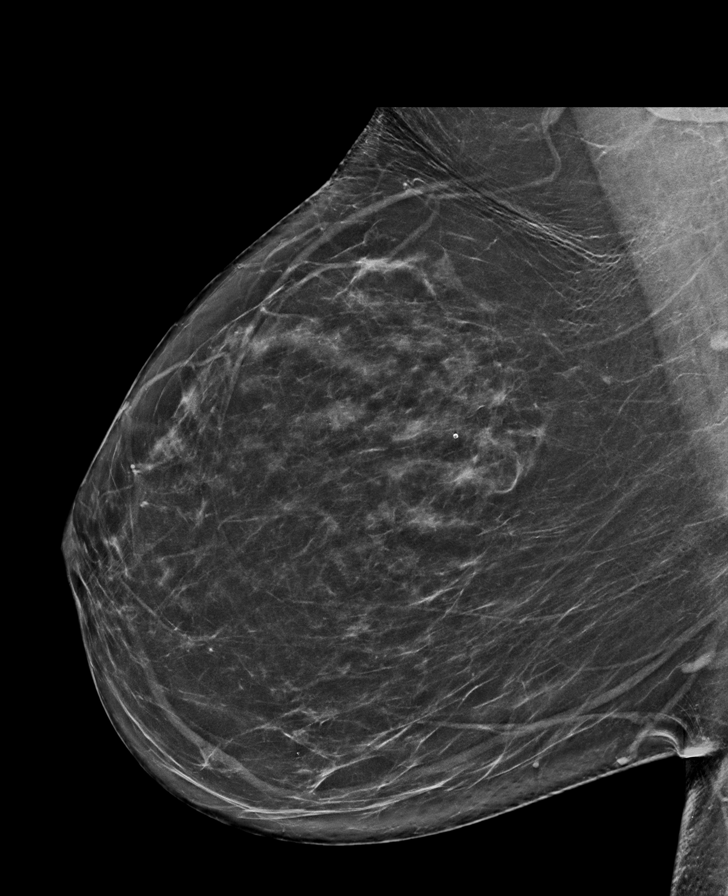

[L MLO synth-2D]
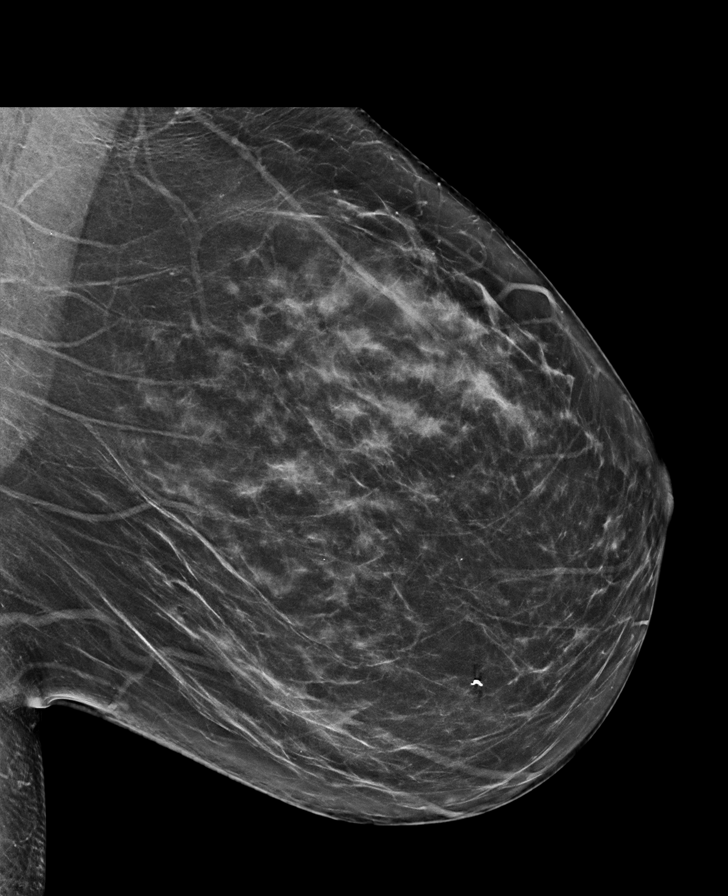

[L CC synth-2D]
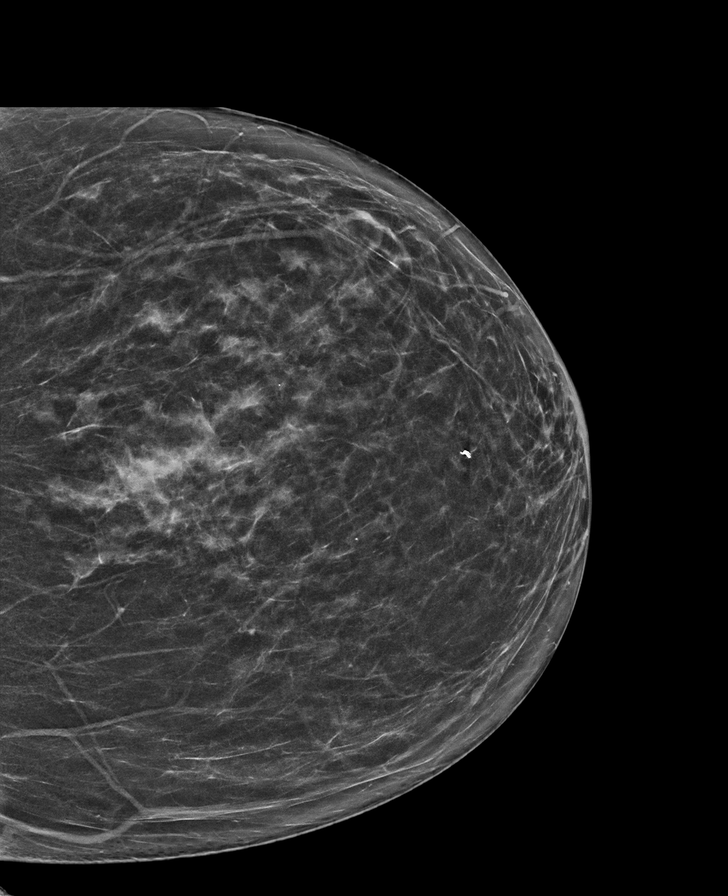

[R CC synth-2D]
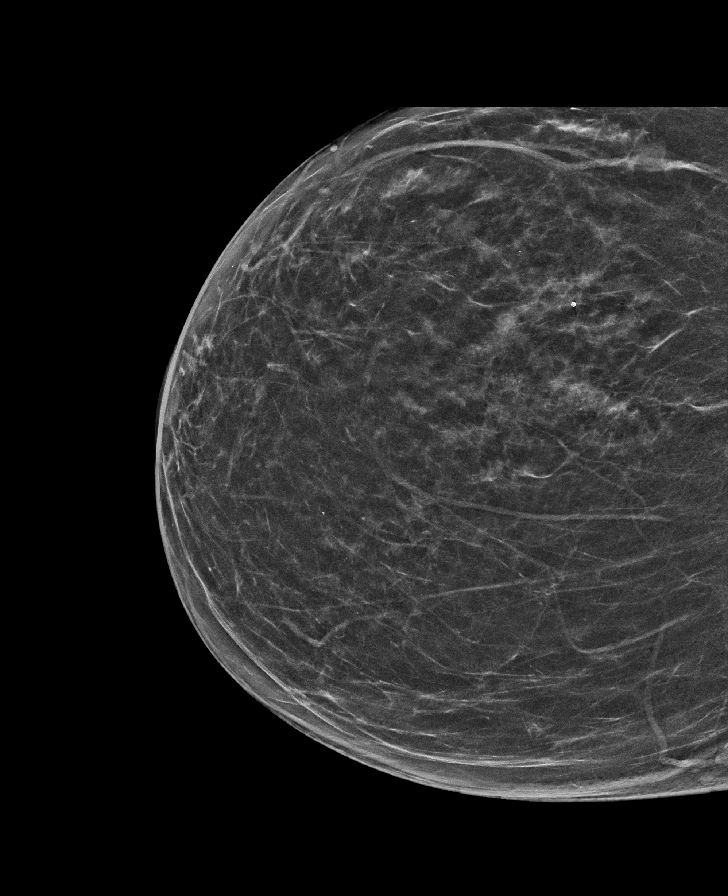

[L CC tomo · tomo slice 34/67.0]
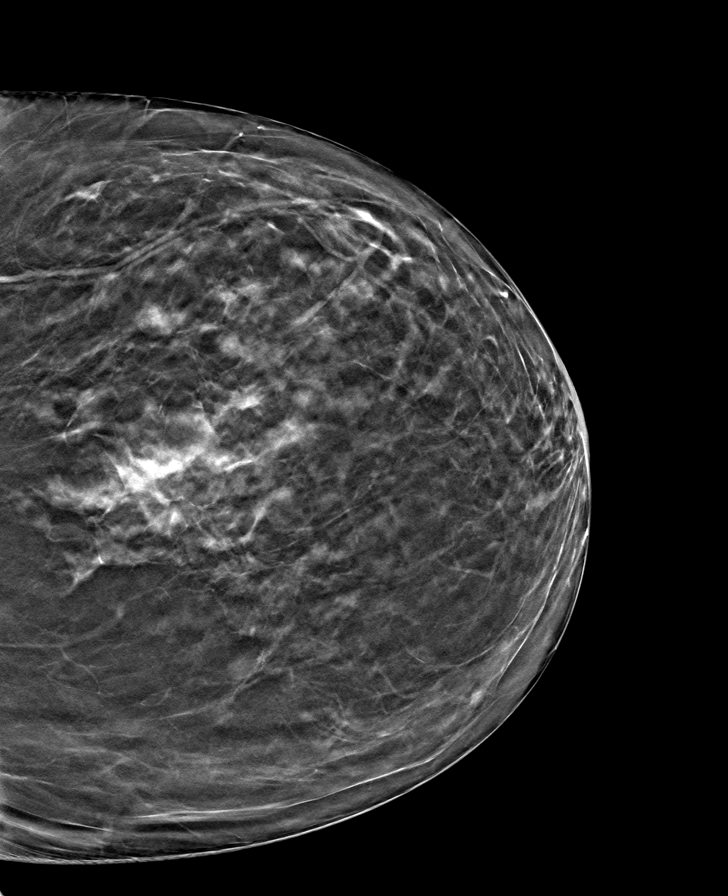

[L MLO tomo · tomo slice 39/77.0]
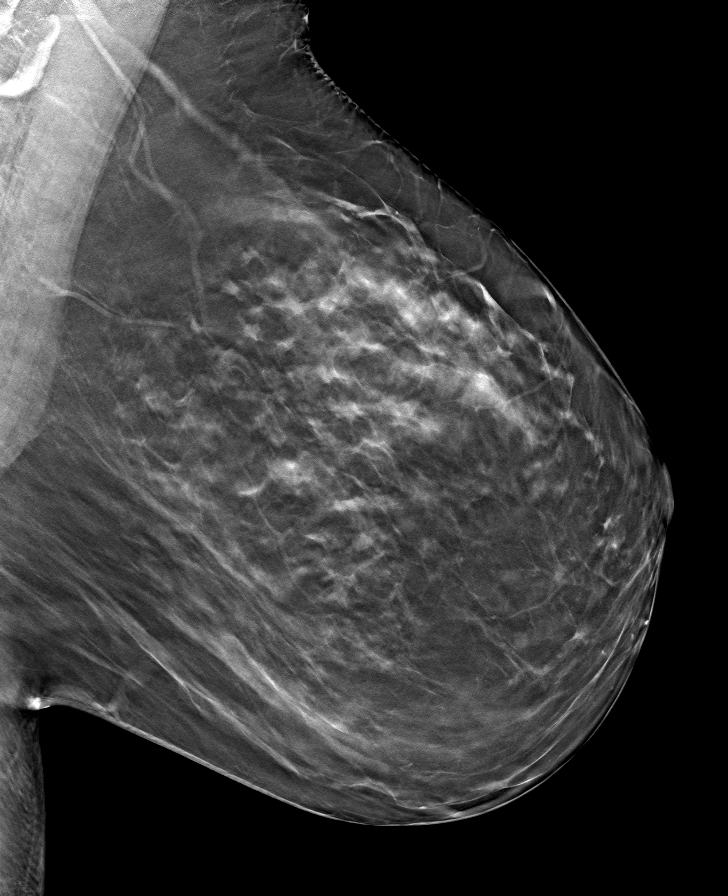

[R CC tomo · tomo slice 33/66.0]
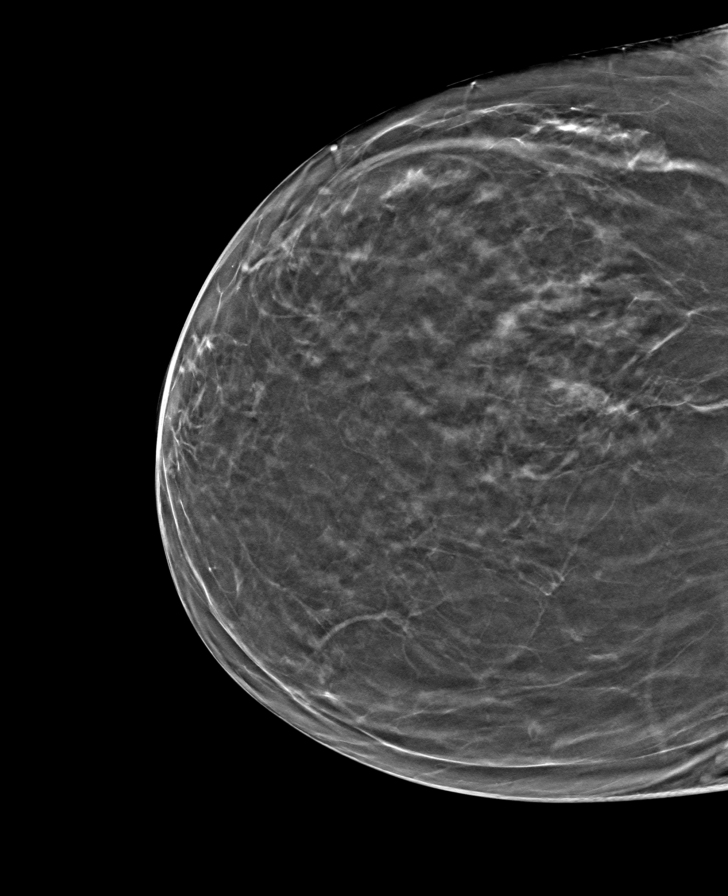

[R MLO tomo · tomo slice 39/78.0]
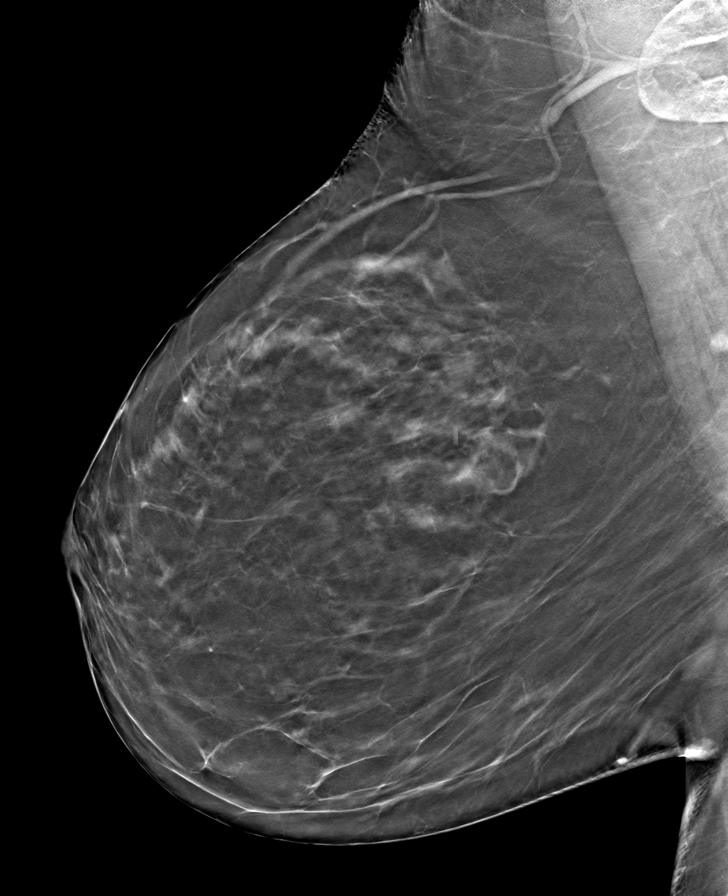

[8 of 24 positions shown; findings below may reference images not displayed]

ACR Breast Density Category b: There are scattered areas of
fibroglandular density.
FINDINGS: There are no findings suspicious for malignancy.
IMPRESSION: No mammographic evidence of malignancy. A result letter of this
screening mammogram will be mailed directly to the patient.

RECOMMENDATION:
Screening mammogram in one year. (Code:51-O-LD2)

BI-RADS CATEGORY  1: Negative.

## 2022-10-04 ENCOUNTER — Encounter: Payer: Self-pay | Admitting: Internal Medicine

## 2022-10-04 NOTE — Patient Instructions (Addendum)
Blood work was ordered.   The lab is on the first floor.    Medications changes include :   none     Return in about 6 months (around 04/07/2023) for follow up.    Health Maintenance, Female Adopting a healthy lifestyle and getting preventive care are important in promoting health and wellness. Ask your health care provider about: The right schedule for you to have regular tests and exams. Things you can do on your own to prevent diseases and keep yourself healthy. What should I know about diet, weight, and exercise? Eat a healthy diet  Eat a diet that includes plenty of vegetables, fruits, low-fat dairy products, and lean protein. Do not eat a lot of foods that are high in solid fats, added sugars, or sodium. Maintain a healthy weight Body mass index (BMI) is used to identify weight problems. It estimates body fat based on height and weight. Your health care provider can help determine your BMI and help you achieve or maintain a healthy weight. Get regular exercise Get regular exercise. This is one of the most important things you can do for your health. Most adults should: Exercise for at least 150 minutes each week. The exercise should increase your heart rate and make you sweat (moderate-intensity exercise). Do strengthening exercises at least twice a week. This is in addition to the moderate-intensity exercise. Spend less time sitting. Even light physical activity can be beneficial. Watch cholesterol and blood lipids Have your blood tested for lipids and cholesterol at 70 years of age, then have this test every 5 years. Have your cholesterol levels checked more often if: Your lipid or cholesterol levels are high. You are older than 70 years of age. You are at high risk for heart disease. What should I know about cancer screening? Depending on your health history and family history, you may need to have cancer screening at various ages. This may include screening  for: Breast cancer. Cervical cancer. Colorectal cancer. Skin cancer. Lung cancer. What should I know about heart disease, diabetes, and high blood pressure? Blood pressure and heart disease High blood pressure causes heart disease and increases the risk of stroke. This is more likely to develop in people who have high blood pressure readings or are overweight. Have your blood pressure checked: Every 3-5 years if you are 93-31 years of age. Every year if you are 39 years old or older. Diabetes Have regular diabetes screenings. This checks your fasting blood sugar level. Have the screening done: Once every three years after age 80 if you are at a normal weight and have a low risk for diabetes. More often and at a younger age if you are overweight or have a high risk for diabetes. What should I know about preventing infection? Hepatitis B If you have a higher risk for hepatitis B, you should be screened for this virus. Talk with your health care provider to find out if you are at risk for hepatitis B infection. Hepatitis C Testing is recommended for: Everyone born from [birth year] through 1965. Anyone with known risk factors for hepatitis C. Sexually transmitted infections (STIs) Get screened for STIs, including gonorrhea and chlamydia, if: You are sexually active and are younger than 70 years of age. You are older than 70 years of age and your health care provider tells you that you are at risk for this type of infection. Your sexual activity has changed since you were last screened, and you are  at increased risk for chlamydia or gonorrhea. Ask your health care provider if you are at risk. Ask your health care provider about whether you are at high risk for HIV. Your health care provider may recommend a prescription medicine to help prevent HIV infection. If you choose to take medicine to prevent HIV, you should first get tested for HIV. You should then be tested every 3 months for as long as you  are taking the medicine. Pregnancy If you are about to stop having your period (premenopausal) and you may become pregnant, seek counseling before you get pregnant. Take 400 to 800 micrograms (mcg) of folic acid every day if you become pregnant. Ask for birth control (contraception) if you want to prevent pregnancy. Osteoporosis and menopause Osteoporosis is a disease in which the bones lose minerals and strength with aging. This can result in bone fractures. If you are 78 years old or older, or if you are at risk for osteoporosis and fractures, ask your health care provider if you should: Be screened for bone loss. Take a calcium or vitamin D supplement to lower your risk of fractures. Be given hormone replacement therapy (HRT) to treat symptoms of menopause. Follow these instructions at home: Alcohol use Do not drink alcohol if: Your health care provider tells you not to drink. You are pregnant, may be pregnant, or are planning to become pregnant. If you drink alcohol: Limit how much you have to: 0-1 drink a day. Know how much alcohol is in your drink. In the [place] one drink equals one 12 oz bottle of beer (355 mL), one 5 oz glass of wine (148 mL), or one 1 oz glass of hard liquor (44 mL). Lifestyle Do not use any products that contain nicotine or tobacco. These products include cigarettes, chewing tobacco, and vaping devices, such as e-cigarettes. If you need help quitting, ask your health care provider. Do not use street drugs. Do not share needles. Ask your health care provider for help if you need support or information about quitting drugs. General instructions Schedule regular health, dental, and eye exams. Stay current with your vaccines. Tell your health care provider if: You often feel depressed. You have ever been abused or do not feel safe at home. Summary Adopting a healthy lifestyle and getting preventive care are important in promoting health and wellness. Follow your  health care provider's instructions about healthy diet, exercising, and getting tested or screened for diseases. Follow your health care provider's instructions on monitoring your cholesterol and blood pressure. This information is not intended to replace advice given to you by your health care provider. Make sure you discuss any questions you have with your health care provider. Document Revised: 11/29/2020 Document Reviewed: 11/29/2020 Elsevier Patient Education  [place].

## 2022-10-04 NOTE — Progress Notes (Unsigned)
Subjective:    Patient ID: Susan Norris, female    DOB: November 13, 1950, 72 y.o.   MRN: [id number]      HPI Susan Norris is here for a Physical exam and her chronic medical problems.   Fell October - torn muscle in hip/upper thigh - using walker.  Taking advil and tylenol.  Slowly improving.  Doing exercises at home.  Not able to easily go to PT because she has to watch her husband.     Medications and allergies reviewed with patient and updated if appropriate.  Current Outpatient Medications on File Prior to Visit  Medication Sig Dispense Refill   albuterol (VENTOLIN HFA) 108 (90 Base) MCG/ACT inhaler Inhale 2 puffs into the lungs every 2 (two) hours as needed for wheezing or shortness of breath (cough). 8 g 0   aspirin 81 MG EC tablet Take 81 mg by mouth daily. Swallow whole.     Cholecalciferol (VITAMIN D3) 50000 units CAPS Take one capsule by mouth twice weekly.  5   clonazePAM (KLONOPIN) 0.5 MG tablet Take 1 tablet (0.5 mg total) by mouth 2 (two) times daily as needed for anxiety. 60 tablet 2   estradiol (ESTRACE) 1 MG tablet TAKE 1.5 TABLETS BY MOUTH DAILY. 135 tablet 2   fexofenadine (ALLEGRA) 180 MG tablet Take by mouth.     fluticasone (FLONASE) 50 MCG/ACT nasal spray Place 2 sprays into both nostrils daily. 9.9 g 0   magnesium gluconate (MAGONATE) 500 MG tablet Take 500 mg by mouth daily.     meclizine (ANTIVERT) 25 MG tablet Take 1 tablet (25 mg total) by mouth 3 (three) times daily as needed for dizziness. 30 tablet 2   No current facility-administered medications on file prior to visit.    Review of Systems  Constitutional:  Negative for fever.  Eyes:  Negative for visual disturbance.  Respiratory:  Negative for cough, shortness of breath and wheezing.   Cardiovascular:  Negative for chest pain, palpitations and leg swelling.  Gastrointestinal:  Negative for abdominal pain, blood in stool, constipation and diarrhea.       No gerd  Genitourinary:  Positive for frequency.  Negative for dysuria.  Musculoskeletal:  Positive for arthralgias. Negative for back pain.  Skin:  Negative for rash.  Neurological:  Positive for dizziness (does epley and takes meclizine prn). Negative for light-headedness and headaches.  Psychiatric/Behavioral:  Negative for dysphoric mood. The patient is not nervous/anxious.        Objective:   Vitals:   10/05/22 1022  BP: 128/80  Pulse: (!) 55  Temp: 98.1 F (36.7 C)  SpO2: 99%   Filed Weights   10/05/22 1022  Weight: 189 lb (85.7 kg)   Body mass index is 32.44 kg/m.  BP Readings from Last 3 Encounters:  10/05/22 128/80  12/29/21 128/80  06/30/21 130/90    Wt Readings from Last 3 Encounters:  10/05/22 189 lb (85.7 kg)  12/29/21 193 lb (87.5 kg)  06/30/21 190 lb (86.2 kg)       Physical Exam Constitutional: She appears well-developed and well-nourished. No distress.  HENT:  Head: Normocephalic and atraumatic.  Right Ear: External ear normal. Normal ear canal and TM Left Ear: External ear normal.  Normal ear canal and TM Mouth/Throat: Oropharynx is clear and moist.  Eyes: Conjunctivae normal.  Neck: Neck supple. No tracheal deviation present. No thyromegaly present.  No carotid bruit  Cardiovascular: Normal rate, regular rhythm and normal heart sounds.   No murmur heard.  No edema. Pulmonary/Chest: Effort normal and breath sounds normal. No respiratory distress. She has no wheezes. She has no rales.  Breast: deferred   Abdominal: Soft. She exhibits no distension. There is no tenderness.  Lymphadenopathy: She has no cervical adenopathy.  Skin: Skin is warm and dry. She is not diaphoretic.  Psychiatric: She has a normal mood and affect. Her behavior is normal.     Lab Results  Component Value Date   WBC 10.1 06/30/2021   HGB 12.2 06/30/2021   HCT 37.8 06/30/2021   PLT 223.0 06/30/2021   GLUCOSE 81 12/29/2021   CHOL 148 12/29/2021   TRIG 181.0 (H) 12/29/2021   HDL 64.40 12/29/2021   LDLCALC 48  12/29/2021   ALT 13 12/29/2021   AST 18 12/29/2021   NA 137 12/29/2021   K 4.6 12/29/2021   CL 100 12/29/2021   CREATININE 0.77 12/29/2021   BUN 11 12/29/2021   CO2 29 12/29/2021   TSH 2.12 06/30/2021   HGBA1C 5.9 12/29/2021         Assessment & Plan:   Physical exam: Screening blood work  ordered Exercise  doing PT Weight  obese Substance abuse  none   Reviewed recommended immunizations.   Health Maintenance  Topic Date Due   COVID-19 Vaccine (4 - 2023-24 season) 10/21/2022 (Originally 03/24/2022)   DEXA SCAN  04/22/2023   Medicare Annual Wellness (AWV)  06/03/2023   MAMMOGRAM  06/24/2023   DTaP/Tdap/Td (2 - Td or Tdap) 07/24/2025   COLONOSCOPY (Pts 45-20yr Insurance coverage will need to be confirmed)  03/19/2027   Pneumonia Vaccine 70 Years old  Completed   INFLUENZA VACCINE  Completed   Hepatitis C Screening  Completed   Zoster Vaccines- Shingrix  Completed   HPV VACCINES  Aged Out      Dexa ordered    See Problem List for Assessment and Plan of chronic medical problems.

## 2022-10-05 ENCOUNTER — Encounter: Payer: Self-pay | Admitting: Internal Medicine

## 2022-10-05 ENCOUNTER — Ambulatory Visit (INDEPENDENT_AMBULATORY_CARE_PROVIDER_SITE_OTHER): Payer: PPO | Admitting: Internal Medicine

## 2022-10-05 VITALS — BP 128/80 | HR 55 | Temp 98.1°F | Ht 64.0 in | Wt 189.0 lb

## 2022-10-05 DIAGNOSIS — R931 Abnormal findings on diagnostic imaging of heart and coronary circulation: Secondary | ICD-10-CM

## 2022-10-05 DIAGNOSIS — F32A Depression, unspecified: Secondary | ICD-10-CM

## 2022-10-05 DIAGNOSIS — R7303 Prediabetes: Secondary | ICD-10-CM

## 2022-10-05 DIAGNOSIS — F419 Anxiety disorder, unspecified: Secondary | ICD-10-CM | POA: Diagnosis not present

## 2022-10-05 DIAGNOSIS — Z0000 Encounter for general adult medical examination without abnormal findings: Secondary | ICD-10-CM | POA: Diagnosis not present

## 2022-10-05 DIAGNOSIS — K2970 Gastritis, unspecified, without bleeding: Secondary | ICD-10-CM

## 2022-10-05 DIAGNOSIS — Z1231 Encounter for screening mammogram for malignant neoplasm of breast: Secondary | ICD-10-CM

## 2022-10-05 DIAGNOSIS — M85859 Other specified disorders of bone density and structure, unspecified thigh: Secondary | ICD-10-CM

## 2022-10-05 DIAGNOSIS — K219 Gastro-esophageal reflux disease without esophagitis: Secondary | ICD-10-CM

## 2022-10-05 DIAGNOSIS — E782 Mixed hyperlipidemia: Secondary | ICD-10-CM | POA: Diagnosis not present

## 2022-10-05 DIAGNOSIS — R350 Frequency of micturition: Secondary | ICD-10-CM | POA: Diagnosis not present

## 2022-10-05 DIAGNOSIS — Z79890 Hormone replacement therapy (postmenopausal): Secondary | ICD-10-CM

## 2022-10-05 LAB — URINALYSIS, ROUTINE W REFLEX MICROSCOPIC
Bilirubin Urine: NEGATIVE
Hgb urine dipstick: NEGATIVE
Ketones, ur: NEGATIVE
Leukocytes,Ua: NEGATIVE
Nitrite: NEGATIVE
RBC / HPF: NONE SEEN (ref 0–?)
Specific Gravity, Urine: 1.025 (ref 1.000–1.030)
Total Protein, Urine: NEGATIVE
Urine Glucose: NEGATIVE
Urobilinogen, UA: 0.2 (ref 0.0–1.0)
pH: 6 (ref 5.0–8.0)

## 2022-10-05 LAB — COMPREHENSIVE METABOLIC PANEL
ALT: 12 U/L (ref 0–35)
AST: 18 U/L (ref 0–37)
Albumin: 3.6 g/dL (ref 3.5–5.2)
Alkaline Phosphatase: 54 U/L (ref 39–117)
BUN: 16 mg/dL (ref 6–23)
CO2: 27 mEq/L (ref 19–32)
Calcium: 9.2 mg/dL (ref 8.4–10.5)
Chloride: 101 mEq/L (ref 96–112)
Creatinine, Ser: 0.68 mg/dL (ref 0.40–1.20)
GFR: 88.55 mL/min (ref >60.00)
Glucose, Bld: 89 mg/dL (ref 70–99)
Potassium: 4.2 mEq/L (ref 3.5–5.1)
Sodium: 136 mEq/L (ref 135–145)
Total Bilirubin: 0.3 mg/dL (ref 0.2–1.2)
Total Protein: 6.7 g/dL (ref 6.0–8.3)

## 2022-10-05 LAB — CBC WITH DIFFERENTIAL/PLATELET
Basophils Absolute: 0.1 10*3/uL (ref 0.0–0.1)
Basophils Relative: 0.4 % (ref 0.0–3.0)
Eosinophils Absolute: 0.2 10*3/uL (ref 0.0–0.7)
Eosinophils Relative: 1.3 % (ref 0.0–5.0)
HCT: 38.9 % (ref 36.0–46.0)
Hemoglobin: 12.8 g/dL (ref 12.0–15.0)
Lymphocytes Relative: 33.6 % (ref 12.0–46.0)
Lymphs Abs: 4 10*3/uL (ref 0.7–4.0)
MCHC: 33 g/dL (ref 30.0–36.0)
MCV: 93.3 fl (ref 78.0–100.0)
Monocytes Absolute: 0.9 10*3/uL (ref 0.1–1.0)
Monocytes Relative: 7.9 % (ref 3.0–12.0)
Neutro Abs: 6.7 10*3/uL (ref 1.4–7.7)
Neutrophils Relative %: 56.8 % (ref 43.0–77.0)
Platelets: 262 10*3/uL (ref 150.0–400.0)
RBC: 4.17 Mil/uL (ref 3.87–5.11)
RDW: 13.4 % (ref 11.5–15.5)
WBC: 11.8 10*3/uL — ABNORMAL HIGH (ref 4.0–10.5)

## 2022-10-05 LAB — LIPID PANEL
Cholesterol: 148 mg/dL (ref 0–200)
HDL: 62.3 mg/dL (ref >39.00)
LDL Cholesterol: 58 mg/dL (ref 0–99)
NonHDL: 86.11
Total CHOL/HDL Ratio: 2
Triglycerides: 140 mg/dL (ref 0.0–149.0)
VLDL: 28 mg/dL (ref 0.0–40.0)

## 2022-10-05 LAB — HEMOGLOBIN A1C: Hgb A1c MFr Bld: 5.9 % (ref 4.6–6.5)

## 2022-10-05 LAB — TSH: TSH: 1.48 u[IU]/mL (ref 0.35–5.50)

## 2022-10-05 MED ORDER — ROSUVASTATIN CALCIUM 10 MG PO TABS: 10.0000 mg | ORAL_TABLET | Freq: Every day | ORAL | 2 refills | Status: DC

## 2022-10-05 MED ORDER — OMEPRAZOLE 40 MG PO CPDR: 40.0000 mg | DELAYED_RELEASE_CAPSULE | Freq: Every day | ORAL | 2 refills | Status: DC

## 2022-10-05 MED ORDER — ESCITALOPRAM OXALATE 10 MG PO TABS: 10.0000 mg | ORAL_TABLET | Freq: Every day | ORAL | 2 refills | Status: DC

## 2022-10-05 NOTE — Assessment & Plan Note (Signed)
Chronic DEXA up-to-date Encouraged regular exercise Advised calcium and vitamin D supplementation

## 2022-10-05 NOTE — Assessment & Plan Note (Signed)
Chronic Reviewed risks and benefits Discussed decreasing dose of estradiol slowly

## 2022-10-05 NOTE — Assessment & Plan Note (Signed)
?   UTI Check ua, ucx

## 2022-10-05 NOTE — Assessment & Plan Note (Signed)
Chronic Check a1c Low sugar / carb diet Stressed regular exercise  

## 2022-10-05 NOTE — Assessment & Plan Note (Signed)
Chronic Controlled, Stable Continue Lexapro 10 mg daily, clonazepam 0.5 mg twice daily as needed-does not take often

## 2022-10-05 NOTE — Assessment & Plan Note (Signed)
Chronic Denies symptoms consistent with angina Continue aspirin 81 mg daily, Crestor 10 mg daily Encouraged healthy diet, regular exercise, weight loss

## 2022-10-05 NOTE — Assessment & Plan Note (Signed)
Chronic GERD controlled Continue omeprazole 40 mg daily 

## 2022-10-05 NOTE — Assessment & Plan Note (Signed)
Chronic Regular exercise and healthy diet encouraged Check lipid panel  Continue Crestor 10 mg daily 

## 2022-10-06 LAB — URINE CULTURE: Result:: NO GROWTH

## 2022-11-21 ENCOUNTER — Ambulatory Visit
Admission: RE | Admit: 2022-11-21 | Discharge: 2022-11-21 | Disposition: A | Discharge status: Home / Self Care | Payer: PPO | Source: Ambulatory Visit | Attending: Internal Medicine | Admitting: Internal Medicine

## 2022-11-21 DIAGNOSIS — Z1231 Encounter for screening mammogram for malignant neoplasm of breast: Secondary | ICD-10-CM | POA: Diagnosis not present

## 2022-12-14 DIAGNOSIS — D1801 Hemangioma of skin and subcutaneous tissue: Secondary | ICD-10-CM | POA: Diagnosis not present

## 2022-12-14 DIAGNOSIS — L218 Other seborrheic dermatitis: Secondary | ICD-10-CM | POA: Diagnosis not present

## 2022-12-14 DIAGNOSIS — D225 Melanocytic nevi of trunk: Secondary | ICD-10-CM | POA: Diagnosis not present

## 2022-12-14 DIAGNOSIS — L578 Other skin changes due to chronic exposure to nonionizing radiation: Secondary | ICD-10-CM | POA: Diagnosis not present

## 2022-12-14 DIAGNOSIS — L821 Other seborrheic keratosis: Secondary | ICD-10-CM | POA: Diagnosis not present

## 2022-12-14 DIAGNOSIS — D2272 Melanocytic nevi of left lower limb, including hip: Secondary | ICD-10-CM | POA: Diagnosis not present

## 2023-04-06 ENCOUNTER — Ambulatory Visit
Admission: RE | Admit: 2023-04-06 | Discharge: 2023-04-06 | Disposition: A | Discharge status: Home / Self Care | Payer: PPO | Source: Ambulatory Visit | Attending: Internal Medicine | Admitting: Internal Medicine

## 2023-04-06 DIAGNOSIS — M85859 Other specified disorders of bone density and structure, unspecified thigh: Secondary | ICD-10-CM

## 2023-04-06 DIAGNOSIS — E349 Endocrine disorder, unspecified: Secondary | ICD-10-CM | POA: Diagnosis not present

## 2023-04-06 DIAGNOSIS — M8588 Other specified disorders of bone density and structure, other site: Secondary | ICD-10-CM | POA: Diagnosis not present

## 2023-04-06 DIAGNOSIS — Z90722 Acquired absence of ovaries, bilateral: Secondary | ICD-10-CM | POA: Diagnosis not present

## 2023-04-25 DIAGNOSIS — F3341 Major depressive disorder, recurrent, in partial remission: Secondary | ICD-10-CM | POA: Diagnosis not present

## 2023-04-25 DIAGNOSIS — N959 Unspecified menopausal and perimenopausal disorder: Secondary | ICD-10-CM | POA: Diagnosis not present

## 2023-04-25 DIAGNOSIS — Z9849 Cataract extraction status, unspecified eye: Secondary | ICD-10-CM | POA: Diagnosis not present

## 2023-04-25 DIAGNOSIS — K219 Gastro-esophageal reflux disease without esophagitis: Secondary | ICD-10-CM | POA: Diagnosis not present

## 2023-04-25 DIAGNOSIS — G8929 Other chronic pain: Secondary | ICD-10-CM | POA: Diagnosis not present

## 2023-04-25 DIAGNOSIS — M1990 Unspecified osteoarthritis, unspecified site: Secondary | ICD-10-CM | POA: Diagnosis not present

## 2023-04-25 DIAGNOSIS — K5090 Crohn's disease, unspecified, without complications: Secondary | ICD-10-CM | POA: Diagnosis not present

## 2023-04-25 DIAGNOSIS — E785 Hyperlipidemia, unspecified: Secondary | ICD-10-CM | POA: Diagnosis not present

## 2023-04-25 DIAGNOSIS — M8580 Other specified disorders of bone density and structure, unspecified site: Secondary | ICD-10-CM | POA: Diagnosis not present

## 2023-04-25 DIAGNOSIS — E669 Obesity, unspecified: Secondary | ICD-10-CM | POA: Diagnosis not present

## 2023-05-01 DIAGNOSIS — R0981 Nasal congestion: Secondary | ICD-10-CM | POA: Diagnosis not present

## 2023-05-01 DIAGNOSIS — J309 Allergic rhinitis, unspecified: Secondary | ICD-10-CM | POA: Diagnosis not present

## 2023-05-01 DIAGNOSIS — J329 Chronic sinusitis, unspecified: Secondary | ICD-10-CM | POA: Diagnosis not present

## 2023-05-27 ENCOUNTER — Other Ambulatory Visit: Payer: Self-pay | Admitting: Internal Medicine

## 2023-06-19 DIAGNOSIS — L738 Other specified follicular disorders: Secondary | ICD-10-CM | POA: Diagnosis not present

## 2023-06-19 DIAGNOSIS — L821 Other seborrheic keratosis: Secondary | ICD-10-CM | POA: Diagnosis not present

## 2023-06-19 DIAGNOSIS — D225 Melanocytic nevi of trunk: Secondary | ICD-10-CM | POA: Diagnosis not present

## 2023-06-19 DIAGNOSIS — L812 Freckles: Secondary | ICD-10-CM | POA: Diagnosis not present

## 2023-06-19 DIAGNOSIS — L820 Inflamed seborrheic keratosis: Secondary | ICD-10-CM | POA: Diagnosis not present

## 2023-06-19 DIAGNOSIS — D1801 Hemangioma of skin and subcutaneous tissue: Secondary | ICD-10-CM | POA: Diagnosis not present

## 2023-06-19 DIAGNOSIS — D2272 Melanocytic nevi of left lower limb, including hip: Secondary | ICD-10-CM | POA: Diagnosis not present

## 2023-06-27 ENCOUNTER — Other Ambulatory Visit: Payer: Self-pay | Admitting: Internal Medicine

## 2023-09-27 ENCOUNTER — Other Ambulatory Visit: Payer: Self-pay | Admitting: Internal Medicine

## 2023-10-07 ENCOUNTER — Encounter: Payer: Self-pay | Admitting: Internal Medicine

## 2023-10-07 DIAGNOSIS — I2510 Atherosclerotic heart disease of native coronary artery without angina pectoris: Secondary | ICD-10-CM | POA: Insufficient documentation

## 2023-10-07 NOTE — Patient Instructions (Addendum)
 Blood work was ordered.   An xray was ordered.     Medications changes include :   increase lexapro to 20 mg daily    A referral was ordered home PT and someone will call you to schedule an appointment.     Return in about 1 year (around 10/07/2024) for Physical Exam.   Health Maintenance, Female Adopting a healthy lifestyle and getting preventive care are important in promoting health and wellness. Ask your health care provider about: The right schedule for you to have regular tests and exams. Things you can do on your own to prevent diseases and keep yourself healthy. What should I know about diet, weight, and exercise? Eat a healthy diet  Eat a diet that includes plenty of vegetables, fruits, low-fat dairy products, and lean protein. Do not eat a lot of foods that are high in solid fats, added sugars, or sodium. Maintain a healthy weight Body mass index (BMI) is used to identify weight problems. It estimates body fat based on height and weight. Your health care provider can help determine your BMI and help you achieve or maintain a healthy weight. Get regular exercise Get regular exercise. This is one of the most important things you can do for your health. Most adults should: Exercise for at least 150 minutes each week. The exercise should increase your heart rate and make you sweat (moderate-intensity exercise). Do strengthening exercises at least twice a week. This is in addition to the moderate-intensity exercise. Spend less time sitting. Even light physical activity can be beneficial. Watch cholesterol and blood lipids Have your blood tested for lipids and cholesterol at 71 years of age, then have this test every 5 years. Have your cholesterol levels checked more often if: Your lipid or cholesterol levels are high. You are older than 71 years of age. You are at high risk for heart disease. What should I know about cancer screening? Depending on your health  history and family history, you may need to have cancer screening at various ages. This may include screening for: Breast cancer. Cervical cancer. Colorectal cancer. Skin cancer. Lung cancer. What should I know about heart disease, diabetes, and high blood pressure? Blood pressure and heart disease High blood pressure causes heart disease and increases the risk of stroke. This is more likely to develop in people who have high blood pressure readings or are overweight. Have your blood pressure checked: Every 3-5 years if you are 34-48 years of age. Every year if you are 69 years old or older. Diabetes Have regular diabetes screenings. This checks your fasting blood sugar level. Have the screening done: Once every three years after age 70 if you are at a normal weight and have a low risk for diabetes. More often and at a younger age if you are overweight or have a high risk for diabetes. What should I know about preventing infection? Hepatitis B If you have a higher risk for hepatitis B, you should be screened for this virus. Talk with your health care provider to find out if you are at risk for hepatitis B infection. Hepatitis C Testing is recommended for: Everyone born from [birth year] through 1965. Anyone with known risk factors for hepatitis C. Sexually transmitted infections (STIs) Get screened for STIs, including gonorrhea and chlamydia, if: You are sexually active and are younger than 71 years of age. You are older than 71 years of age and your health care provider tells you that you are  at risk for this type of infection. Your sexual activity has changed since you were last screened, and you are at increased risk for chlamydia or gonorrhea. Ask your health care provider if you are at risk. Ask your health care provider about whether you are at high risk for HIV. Your health care provider may recommend a prescription medicine to help prevent HIV infection. If you choose to take medicine to  prevent HIV, you should first get tested for HIV. You should then be tested every 3 months for as long as you are taking the medicine. Pregnancy If you are about to stop having your period (premenopausal) and you may become pregnant, seek counseling before you get pregnant. Take 400 to 800 micrograms (mcg) of folic acid every day if you become pregnant. Ask for birth control (contraception) if you want to prevent pregnancy. Osteoporosis and menopause Osteoporosis is a disease in which the bones lose minerals and strength with aging. This can result in bone fractures. If you are 28 years old or older, or if you are at risk for osteoporosis and fractures, ask your health care provider if you should: Be screened for bone loss. Take a calcium or vitamin D supplement to lower your risk of fractures. Be given hormone replacement therapy (HRT) to treat symptoms of menopause. Follow these instructions at home: Alcohol use Do not drink alcohol if: Your health care provider tells you not to drink. You are pregnant, may be pregnant, or are planning to become pregnant. If you drink alcohol: Limit how much you have to: 0-1 drink a day. Know how much alcohol is in your drink. In the [place] one drink equals one 12 oz bottle of beer (355 mL), one 5 oz glass of wine (148 mL), or one 1 oz glass of hard liquor (44 mL). Lifestyle Do not use any products that contain nicotine or tobacco. These products include cigarettes, chewing tobacco, and vaping devices, such as e-cigarettes. If you need help quitting, ask your health care provider. Do not use street drugs. Do not share needles. Ask your health care provider for help if you need support or information about quitting drugs. General instructions Schedule regular health, dental, and eye exams. Stay current with your vaccines. Tell your health care provider if: You often feel depressed. You have ever been abused or do not feel safe at  home. Summary Adopting a healthy lifestyle and getting preventive care are important in promoting health and wellness. Follow your health care provider's instructions about healthy diet, exercising, and getting tested or screened for diseases. Follow your health care provider's instructions on monitoring your cholesterol and blood pressure. This information is not intended to replace advice given to you by your health care provider. Make sure you discuss any questions you have with your health care provider. Document Revised: 11/29/2020 Document Reviewed: 11/29/2020 Elsevier Patient Education  2024 [employer].

## 2023-10-07 NOTE — Progress Notes (Unsigned)
 Subjective:    Patient ID: Susan Norris, female    DOB: 01/12/1951, 73 y.o.   MRN: [id number]      HPI Susan Norris is here for a Physical exam and her chronic medical problems.   Had MRI after fall last year- saw ortho and was told it was small torn muscles. No surgery was needed.  Started to do PT, but had to stop due to caring for her husband.  Still having pain and difficulty walking - using rollator.  Taking advil and tylenol.     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    Medications and allergies reviewed with patient and updated if appropriate.  Current Outpatient Medications on File Prior to Visit  Medication Sig Dispense Refill   albuterol (VENTOLIN HFA) 108 (90 Base) MCG/ACT inhaler Inhale 2 puffs into the lungs every 2 (two) hours as needed for wheezing or shortness of breath (cough). 8 g 0   aspirin 81 MG EC tablet Take 81 mg by mouth daily. Swallow whole.     Cholecalciferol (VITAMIN D3) 50000 units CAPS Take one capsule by mouth twice weekly.  5   clonazePAM (KLONOPIN) 0.5 MG tablet Take 1 tablet (0.5 mg total) by mouth 2 (two) times daily as needed for anxiety. 60 tablet 2   estradiol (ESTRACE) 1 MG tablet TAKE 1 AND 1/2 TABLETS DAILY BY MOUTH 135 tablet 2   fexofenadine (ALLEGRA) 180 MG tablet Take by mouth.     fluticasone (FLONASE) 50 MCG/ACT nasal spray Place 2 sprays into both nostrils daily. 9.9 g 0   magnesium gluconate (MAGONATE) 500 MG tablet Take 500 mg by mouth daily.     meclizine (ANTIVERT) 25 MG tablet Take 1 tablet (25 mg total) by mouth 3 (three) times daily as needed for dizziness. 30 tablet 2   omeprazole (PRILOSEC) 40 MG capsule Take 1 capsule (40 mg total) by mouth daily. 90 capsule 2   rosuvastatin (CRESTOR) 10 MG tablet Take 1 tablet (10 mg total) by mouth daily. 90 tablet 2   No current facility-administered medications on file prior to visit.    Review of Systems   Constitutional:  Negative for fever.  Eyes:  Negative for visual disturbance.  Respiratory:  Negative for cough, shortness of breath and wheezing.   Cardiovascular:  Negative for chest pain, palpitations and leg swelling.  Gastrointestinal:  Negative for abdominal pain, blood in stool, constipation and diarrhea.       No gerd  Genitourinary:  Positive for frequency. Negative for dysuria.  Musculoskeletal:  Positive for arthralgias (left hip, both shoulders) and back pain (left lower back).  Skin:  Negative for rash.  Neurological:  Negative for light-headedness and headaches.  Psychiatric/Behavioral:  Positive for dysphoric mood. The patient is nervous/anxious.        Objective:   Vitals:   10/08/23 1320  BP: 124/76  Temp: 98.1 F (36.7 C)  SpO2: 98%   Filed Weights   10/08/23 1320  Weight: 186 lb (84.4 kg)   Body mass index is 31.93 kg/m.  BP Readings from Last 3 Encounters:  10/08/23 124/76  10/05/22 128/80  12/29/21 128/80    Wt Readings from Last 3 Encounters:  10/08/23 186 lb (84.4 kg)  10/05/22 189 lb (85.7 kg)  12/29/21 193 lb (87.5 kg)       Physical Exam Constitutional: She appears well-developed and well-nourished. No distress.  HENT:  Head:  Normocephalic and atraumatic.  Right Ear: External ear normal. Normal ear canal and TM Left Ear: External ear normal.  Normal ear canal and TM Mouth/Throat: Oropharynx is clear and moist.  Eyes: Conjunctivae normal.  Neck: Neck supple. No tracheal deviation present. No thyromegaly present.  No carotid bruit  Cardiovascular: Normal rate, regular rhythm and normal heart sounds.   No murmur heard.  No edema. Pulmonary/Chest: Effort normal and breath sounds normal. No respiratory distress. She has no wheezes. She has no rales.  Breast: deferred   Abdominal: Soft. She exhibits no distension. There is no tenderness.  Lymphadenopathy: She has no cervical adenopathy.  Skin: Skin is warm and dry. She is not  diaphoretic.  Psychiatric: She has a normal mood and affect. Her behavior is normal.     Lab Results  Component Value Date   WBC 11.8 (H) 10/05/2022   HGB 12.8 10/05/2022   HCT 38.9 10/05/2022   PLT 262.0 10/05/2022   GLUCOSE 89 10/05/2022   CHOL 148 10/05/2022   TRIG 140.0 10/05/2022   HDL 62.30 10/05/2022   LDLCALC 58 10/05/2022   ALT 12 10/05/2022   AST 18 10/05/2022   NA 136 10/05/2022   K 4.2 10/05/2022   CL 101 10/05/2022   CREATININE 0.68 10/05/2022   BUN 16 10/05/2022   CO2 27 10/05/2022   TSH 1.48 10/05/2022   HGBA1C 5.9 10/05/2022         Assessment & Plan:   Physical exam: Screening blood work  ordered Exercise  none Weight  obese - difficulty doing what she needs to do bc of caring for her husband and hip pain - stress eating Substance abuse  none   Reviewed recommended immunizations.   Health Maintenance  Topic Date Due   Medicare Annual Wellness (AWV)  06/03/2023   COVID-19 Vaccine (4 - 2024-25 season) 10/24/2023 (Originally 03/25/2023)   MAMMOGRAM  11/20/2024   DTaP/Tdap/Td (2 - Td or Tdap) 07/24/2025   DEXA SCAN  04/05/2026   Colonoscopy  03/19/2027   Pneumonia Vaccine 58+ Years old  Completed   INFLUENZA VACCINE  Completed   Hepatitis C Screening  Completed   Zoster Vaccines- Shingrix  Completed   HPV VACCINES  Aged Out          See Problem List for Assessment and Plan of chronic medical problems.

## 2023-10-08 ENCOUNTER — Ambulatory Visit (INDEPENDENT_AMBULATORY_CARE_PROVIDER_SITE_OTHER)

## 2023-10-08 ENCOUNTER — Ambulatory Visit (INDEPENDENT_AMBULATORY_CARE_PROVIDER_SITE_OTHER): Payer: PPO | Admitting: Internal Medicine

## 2023-10-08 VITALS — BP 124/76 | Temp 98.1°F | Ht 64.0 in | Wt 186.0 lb

## 2023-10-08 DIAGNOSIS — F32A Depression, unspecified: Secondary | ICD-10-CM | POA: Diagnosis not present

## 2023-10-08 DIAGNOSIS — G8929 Other chronic pain: Secondary | ICD-10-CM

## 2023-10-08 DIAGNOSIS — K219 Gastro-esophageal reflux disease without esophagitis: Secondary | ICD-10-CM | POA: Diagnosis not present

## 2023-10-08 DIAGNOSIS — I2510 Atherosclerotic heart disease of native coronary artery without angina pectoris: Secondary | ICD-10-CM | POA: Diagnosis not present

## 2023-10-08 DIAGNOSIS — E782 Mixed hyperlipidemia: Secondary | ICD-10-CM

## 2023-10-08 DIAGNOSIS — M25552 Pain in left hip: Secondary | ICD-10-CM | POA: Diagnosis not present

## 2023-10-08 DIAGNOSIS — Z0000 Encounter for general adult medical examination without abnormal findings: Secondary | ICD-10-CM

## 2023-10-08 DIAGNOSIS — R7303 Prediabetes: Secondary | ICD-10-CM

## 2023-10-08 DIAGNOSIS — M160 Bilateral primary osteoarthritis of hip: Secondary | ICD-10-CM | POA: Diagnosis not present

## 2023-10-08 DIAGNOSIS — F419 Anxiety disorder, unspecified: Secondary | ICD-10-CM | POA: Diagnosis not present

## 2023-10-08 DIAGNOSIS — M8580 Other specified disorders of bone density and structure, unspecified site: Secondary | ICD-10-CM | POA: Diagnosis not present

## 2023-10-08 DIAGNOSIS — M47816 Spondylosis without myelopathy or radiculopathy, lumbar region: Secondary | ICD-10-CM | POA: Diagnosis not present

## 2023-10-08 DIAGNOSIS — Z79890 Hormone replacement therapy (postmenopausal): Secondary | ICD-10-CM

## 2023-10-08 LAB — COMPREHENSIVE METABOLIC PANEL
ALT: 11 U/L (ref 0–35)
AST: 18 U/L (ref 0–37)
Albumin: 4 g/dL (ref 3.5–5.2)
Alkaline Phosphatase: 50 U/L (ref 39–117)
BUN: 15 mg/dL (ref 6–23)
CO2: 26 meq/L (ref 19–32)
Calcium: 9.4 mg/dL (ref 8.4–10.5)
Chloride: 99 meq/L (ref 96–112)
Creatinine, Ser: 0.82 mg/dL (ref 0.40–1.20)
GFR: 72.21 mL/min (ref >60.00)
Glucose, Bld: 95 mg/dL (ref 70–99)
Potassium: 4 meq/L (ref 3.5–5.1)
Sodium: 135 meq/L (ref 135–145)
Total Bilirubin: 0.4 mg/dL (ref 0.2–1.2)
Total Protein: 7.1 g/dL (ref 6.0–8.3)

## 2023-10-08 LAB — CBC WITH DIFFERENTIAL/PLATELET
Basophils Absolute: 0 10*3/uL (ref 0.0–0.1)
Basophils Relative: 0.4 % (ref 0.0–3.0)
Eosinophils Absolute: 0.2 10*3/uL (ref 0.0–0.7)
Eosinophils Relative: 1.5 % (ref 0.0–5.0)
HCT: 36 % (ref 36.0–46.0)
Hemoglobin: 11.8 g/dL — ABNORMAL LOW (ref 12.0–15.0)
Lymphocytes Relative: 31.2 % (ref 12.0–46.0)
Lymphs Abs: 3.4 10*3/uL (ref 0.7–4.0)
MCHC: 32.8 g/dL (ref 30.0–36.0)
MCV: 86.3 fl (ref 78.0–100.0)
Monocytes Absolute: 0.8 10*3/uL (ref 0.1–1.0)
Monocytes Relative: 7.6 % (ref 3.0–12.0)
Neutro Abs: 6.5 10*3/uL (ref 1.4–7.7)
Neutrophils Relative %: 59.3 % (ref 43.0–77.0)
Platelets: 268 10*3/uL (ref 150.0–400.0)
RBC: 4.17 Mil/uL (ref 3.87–5.11)
RDW: 13.4 % (ref 11.5–15.5)
WBC: 10.9 10*3/uL — ABNORMAL HIGH (ref 4.0–10.5)

## 2023-10-08 LAB — LIPID PANEL
Cholesterol: 142 mg/dL (ref 0–200)
HDL: 65.9 mg/dL (ref >39.00)
LDL Cholesterol: 50 mg/dL (ref 0–99)
NonHDL: 75.98
Total CHOL/HDL Ratio: 2
Triglycerides: 130 mg/dL (ref 0.0–149.0)
VLDL: 26 mg/dL (ref 0.0–40.0)

## 2023-10-08 LAB — HEMOGLOBIN A1C: Hgb A1c MFr Bld: 6.1 % (ref 4.6–6.5)

## 2023-10-08 LAB — TSH: TSH: 1.31 u[IU]/mL (ref 0.35–5.50)

## 2023-10-08 MED ORDER — ESCITALOPRAM OXALATE 20 MG PO TABS: 20.0000 mg | ORAL_TABLET | Freq: Every day | ORAL | 1 refills | Status: DC

## 2023-10-08 MED ORDER — ESTRADIOL 1 MG PO TABS: 1.0000 mg | ORAL_TABLET | Freq: Every day | ORAL | 2 refills | Status: DC

## 2023-10-08 NOTE — Assessment & Plan Note (Signed)
 Chronic Denies any chest pain, shortness of breath or palpitations Continue aspirin 81 mg daily, rosuvastatin 10 mg daily Checks CMP, CBC, lipid panel, TSH

## 2023-10-08 NOTE — Assessment & Plan Note (Signed)
 Chronic Lab Results  Component Value Date   HGBA1C 5.9 10/05/2022   Check a1c Low sugar / carb diet Stressed regular exercise

## 2023-10-08 NOTE — Assessment & Plan Note (Addendum)
 Chronic Started after a fall last year She did have a strain of her hip abductor and also moderate osteoarthritis noted She started to do PT but had to stop because she had to care for her husband Has continued to have pain and is walking with a rollator Referral for home PT-she is not able to leave because of her imbalance, difficulty walking, pain with walking and need to care for her husband X-ray of hip today

## 2023-10-08 NOTE — Assessment & Plan Note (Signed)
 Chronic Lab Results  Component Value Date   LDLCALC 58 10/05/2022   Regular exercise and healthy diet encouraged Check lipid panel, cmp, tsh Continue Crestor 10 mg daily

## 2023-10-08 NOTE — Assessment & Plan Note (Addendum)
 Chronic Not ideally controlled - high stress Increase lexapro to 20 mg clonazepam 0.5 mg twice daily as needed-does not take often

## 2023-10-08 NOTE — Assessment & Plan Note (Addendum)
 Chronic Reviewed risks and benefits Discussed decreasing dose of estradiol slowly-will decrease to 1 mg daily

## 2023-10-08 NOTE — Assessment & Plan Note (Signed)
 Chronic GERD controlled Continue omeprazole 40 mg daily

## 2023-10-09 ENCOUNTER — Other Ambulatory Visit: Payer: Self-pay | Admitting: Internal Medicine

## 2023-10-09 ENCOUNTER — Encounter: Payer: Self-pay | Admitting: Internal Medicine

## 2023-10-15 ENCOUNTER — Encounter: Payer: Self-pay | Admitting: Internal Medicine

## 2023-10-22 DIAGNOSIS — Z79890 Hormone replacement therapy (postmenopausal): Secondary | ICD-10-CM | POA: Diagnosis not present

## 2023-10-22 DIAGNOSIS — I2510 Atherosclerotic heart disease of native coronary artery without angina pectoris: Secondary | ICD-10-CM | POA: Diagnosis not present

## 2023-10-22 DIAGNOSIS — M1612 Unilateral primary osteoarthritis, left hip: Secondary | ICD-10-CM | POA: Diagnosis not present

## 2023-10-22 DIAGNOSIS — R7303 Prediabetes: Secondary | ICD-10-CM | POA: Diagnosis not present

## 2023-10-22 DIAGNOSIS — S76012D Strain of muscle, fascia and tendon of left hip, subsequent encounter: Secondary | ICD-10-CM | POA: Diagnosis not present

## 2023-10-22 DIAGNOSIS — E782 Mixed hyperlipidemia: Secondary | ICD-10-CM | POA: Diagnosis not present

## 2023-10-22 DIAGNOSIS — K219 Gastro-esophageal reflux disease without esophagitis: Secondary | ICD-10-CM | POA: Diagnosis not present

## 2023-10-22 DIAGNOSIS — Z7951 Long term (current) use of inhaled steroids: Secondary | ICD-10-CM | POA: Diagnosis not present

## 2023-10-22 DIAGNOSIS — W19XXXD Unspecified fall, subsequent encounter: Secondary | ICD-10-CM | POA: Diagnosis not present

## 2023-10-22 DIAGNOSIS — G8929 Other chronic pain: Secondary | ICD-10-CM | POA: Diagnosis not present

## 2023-10-22 DIAGNOSIS — F419 Anxiety disorder, unspecified: Secondary | ICD-10-CM | POA: Diagnosis not present

## 2023-10-22 DIAGNOSIS — F32A Depression, unspecified: Secondary | ICD-10-CM | POA: Diagnosis not present

## 2023-10-23 ENCOUNTER — Encounter: Payer: Self-pay | Admitting: Internal Medicine

## 2023-10-29 DIAGNOSIS — R7303 Prediabetes: Secondary | ICD-10-CM | POA: Diagnosis not present

## 2023-10-29 DIAGNOSIS — F419 Anxiety disorder, unspecified: Secondary | ICD-10-CM | POA: Diagnosis not present

## 2023-10-29 DIAGNOSIS — Z79890 Hormone replacement therapy (postmenopausal): Secondary | ICD-10-CM | POA: Diagnosis not present

## 2023-10-29 DIAGNOSIS — S76012D Strain of muscle, fascia and tendon of left hip, subsequent encounter: Secondary | ICD-10-CM | POA: Diagnosis not present

## 2023-10-29 DIAGNOSIS — W19XXXD Unspecified fall, subsequent encounter: Secondary | ICD-10-CM | POA: Diagnosis not present

## 2023-10-29 DIAGNOSIS — Z7951 Long term (current) use of inhaled steroids: Secondary | ICD-10-CM | POA: Diagnosis not present

## 2023-10-29 DIAGNOSIS — I2510 Atherosclerotic heart disease of native coronary artery without angina pectoris: Secondary | ICD-10-CM | POA: Diagnosis not present

## 2023-10-29 DIAGNOSIS — G8929 Other chronic pain: Secondary | ICD-10-CM | POA: Diagnosis not present

## 2023-10-29 DIAGNOSIS — F32A Depression, unspecified: Secondary | ICD-10-CM | POA: Diagnosis not present

## 2023-10-29 DIAGNOSIS — K219 Gastro-esophageal reflux disease without esophagitis: Secondary | ICD-10-CM | POA: Diagnosis not present

## 2023-10-29 DIAGNOSIS — E782 Mixed hyperlipidemia: Secondary | ICD-10-CM | POA: Diagnosis not present

## 2023-10-29 DIAGNOSIS — M1612 Unilateral primary osteoarthritis, left hip: Secondary | ICD-10-CM | POA: Diagnosis not present

## 2023-11-01 ENCOUNTER — Other Ambulatory Visit: Payer: Self-pay | Admitting: Internal Medicine

## 2023-11-01 DIAGNOSIS — K219 Gastro-esophageal reflux disease without esophagitis: Secondary | ICD-10-CM

## 2023-11-13 ENCOUNTER — Emergency Department (HOSPITAL_BASED_OUTPATIENT_CLINIC_OR_DEPARTMENT_OTHER)

## 2023-11-13 ENCOUNTER — Emergency Department (HOSPITAL_BASED_OUTPATIENT_CLINIC_OR_DEPARTMENT_OTHER): Admitting: Radiology

## 2023-11-13 ENCOUNTER — Inpatient Hospital Stay (HOSPITAL_BASED_OUTPATIENT_CLINIC_OR_DEPARTMENT_OTHER)
Admission: EM | Admit: 2023-11-13 | Discharge: 2023-11-16 | DRG: 378 | Disposition: A | Discharge status: Home / Self Care | Attending: Family Medicine | Admitting: Family Medicine

## 2023-11-13 ENCOUNTER — Other Ambulatory Visit: Payer: Self-pay

## 2023-11-13 ENCOUNTER — Encounter (HOSPITAL_BASED_OUTPATIENT_CLINIC_OR_DEPARTMENT_OTHER): Payer: Self-pay | Admitting: Emergency Medicine

## 2023-11-13 DIAGNOSIS — W19XXXA Unspecified fall, initial encounter: Secondary | ICD-10-CM | POA: Diagnosis present

## 2023-11-13 DIAGNOSIS — K648 Other hemorrhoids: Secondary | ICD-10-CM | POA: Diagnosis not present

## 2023-11-13 DIAGNOSIS — M1610 Unilateral primary osteoarthritis, unspecified hip: Secondary | ICD-10-CM | POA: Diagnosis not present

## 2023-11-13 DIAGNOSIS — Z83438 Family history of other disorder of lipoprotein metabolism and other lipidemia: Secondary | ICD-10-CM | POA: Diagnosis not present

## 2023-11-13 DIAGNOSIS — Z841 Family history of disorders of kidney and ureter: Secondary | ICD-10-CM | POA: Diagnosis not present

## 2023-11-13 DIAGNOSIS — F32A Depression, unspecified: Secondary | ICD-10-CM | POA: Diagnosis not present

## 2023-11-13 DIAGNOSIS — Z8349 Family history of other endocrine, nutritional and metabolic diseases: Secondary | ICD-10-CM

## 2023-11-13 DIAGNOSIS — Z8261 Family history of arthritis: Secondary | ICD-10-CM

## 2023-11-13 DIAGNOSIS — Z8249 Family history of ischemic heart disease and other diseases of the circulatory system: Secondary | ICD-10-CM

## 2023-11-13 DIAGNOSIS — K51211 Ulcerative (chronic) proctitis with rectal bleeding: Secondary | ICD-10-CM | POA: Diagnosis not present

## 2023-11-13 DIAGNOSIS — Z818 Family history of other mental and behavioral disorders: Secondary | ICD-10-CM

## 2023-11-13 DIAGNOSIS — R7303 Prediabetes: Secondary | ICD-10-CM | POA: Diagnosis present

## 2023-11-13 DIAGNOSIS — D509 Iron deficiency anemia, unspecified: Secondary | ICD-10-CM | POA: Diagnosis present

## 2023-11-13 DIAGNOSIS — R5383 Other fatigue: Secondary | ICD-10-CM | POA: Diagnosis present

## 2023-11-13 DIAGNOSIS — Z7982 Long term (current) use of aspirin: Secondary | ICD-10-CM | POA: Diagnosis not present

## 2023-11-13 DIAGNOSIS — K5010 Crohn's disease of large intestine without complications: Secondary | ICD-10-CM | POA: Diagnosis not present

## 2023-11-13 DIAGNOSIS — E785 Hyperlipidemia, unspecified: Secondary | ICD-10-CM | POA: Diagnosis not present

## 2023-11-13 DIAGNOSIS — K922 Gastrointestinal hemorrhage, unspecified: Principal | ICD-10-CM

## 2023-11-13 DIAGNOSIS — Z8616 Personal history of COVID-19: Secondary | ICD-10-CM | POA: Diagnosis not present

## 2023-11-13 DIAGNOSIS — B348 Other viral infections of unspecified site: Secondary | ICD-10-CM | POA: Diagnosis present

## 2023-11-13 DIAGNOSIS — Z825 Family history of asthma and other chronic lower respiratory diseases: Secondary | ICD-10-CM | POA: Diagnosis not present

## 2023-11-13 DIAGNOSIS — R531 Weakness: Secondary | ICD-10-CM | POA: Diagnosis present

## 2023-11-13 DIAGNOSIS — J45909 Unspecified asthma, uncomplicated: Secondary | ICD-10-CM | POA: Diagnosis present

## 2023-11-13 DIAGNOSIS — D649 Anemia, unspecified: Secondary | ICD-10-CM | POA: Diagnosis not present

## 2023-11-13 DIAGNOSIS — K5730 Diverticulosis of large intestine without perforation or abscess without bleeding: Secondary | ICD-10-CM | POA: Diagnosis not present

## 2023-11-13 DIAGNOSIS — Z90710 Acquired absence of both cervix and uterus: Secondary | ICD-10-CM | POA: Diagnosis not present

## 2023-11-13 DIAGNOSIS — K5521 Angiodysplasia of colon with hemorrhage: Principal | ICD-10-CM | POA: Diagnosis present

## 2023-11-13 DIAGNOSIS — Z862 Personal history of diseases of the blood and blood-forming organs and certain disorders involving the immune mechanism: Secondary | ICD-10-CM

## 2023-11-13 DIAGNOSIS — I2510 Atherosclerotic heart disease of native coronary artery without angina pectoris: Secondary | ICD-10-CM | POA: Diagnosis present

## 2023-11-13 DIAGNOSIS — Z833 Family history of diabetes mellitus: Secondary | ICD-10-CM

## 2023-11-13 DIAGNOSIS — R059 Cough, unspecified: Secondary | ICD-10-CM | POA: Diagnosis not present

## 2023-11-13 DIAGNOSIS — R5381 Other malaise: Secondary | ICD-10-CM | POA: Diagnosis present

## 2023-11-13 DIAGNOSIS — Z96649 Presence of unspecified artificial hip joint: Secondary | ICD-10-CM | POA: Diagnosis present

## 2023-11-13 DIAGNOSIS — M8668 Other chronic osteomyelitis, other site: Secondary | ICD-10-CM | POA: Diagnosis not present

## 2023-11-13 DIAGNOSIS — Z823 Family history of stroke: Secondary | ICD-10-CM

## 2023-11-13 DIAGNOSIS — K5520 Angiodysplasia of colon without hemorrhage: Secondary | ICD-10-CM | POA: Diagnosis not present

## 2023-11-13 DIAGNOSIS — R051 Acute cough: Secondary | ICD-10-CM | POA: Diagnosis not present

## 2023-11-13 DIAGNOSIS — Z79899 Other long term (current) drug therapy: Secondary | ICD-10-CM

## 2023-11-13 DIAGNOSIS — K449 Diaphragmatic hernia without obstruction or gangrene: Secondary | ICD-10-CM | POA: Diagnosis present

## 2023-11-13 DIAGNOSIS — K219 Gastro-esophageal reflux disease without esophagitis: Secondary | ICD-10-CM | POA: Diagnosis present

## 2023-11-13 DIAGNOSIS — M1990 Unspecified osteoarthritis, unspecified site: Secondary | ICD-10-CM | POA: Diagnosis not present

## 2023-11-13 DIAGNOSIS — Z8619 Personal history of other infectious and parasitic diseases: Secondary | ICD-10-CM

## 2023-11-13 LAB — CBC WITH DIFFERENTIAL/PLATELET
Abs Immature Granulocytes: 0.04 10*3/uL (ref 0.00–0.07)
Basophils Absolute: 0 10*3/uL (ref 0.0–0.1)
Basophils Relative: 0 %
Eosinophils Absolute: 0.4 10*3/uL (ref 0.0–0.5)
Eosinophils Relative: 4 %
HCT: 23.6 % — ABNORMAL LOW (ref 36.0–46.0)
Hemoglobin: 7.2 g/dL — ABNORMAL LOW (ref 12.0–15.0)
Immature Granulocytes: 0 %
Lymphocytes Relative: 24 %
Lymphs Abs: 2.3 10*3/uL (ref 0.7–4.0)
MCH: 27.2 pg (ref 26.0–34.0)
MCHC: 30.5 g/dL (ref 30.0–36.0)
MCV: 89.1 fL (ref 80.0–100.0)
Monocytes Absolute: 1 10*3/uL (ref 0.1–1.0)
Monocytes Relative: 10 %
Neutro Abs: 5.8 10*3/uL (ref 1.7–7.7)
Neutrophils Relative %: 62 %
Platelets: 331 10*3/uL (ref 150–400)
RBC: 2.65 MIL/uL — ABNORMAL LOW (ref 3.87–5.11)
RDW: 14.7 % (ref 11.5–15.5)
WBC: 9.5 10*3/uL (ref 4.0–10.5)
nRBC: 0 % (ref 0.0–0.2)

## 2023-11-13 LAB — COMPREHENSIVE METABOLIC PANEL WITH GFR
ALT: 8 U/L (ref 0–44)
AST: 17 U/L (ref 15–41)
Albumin: 3.9 g/dL (ref 3.5–5.0)
Alkaline Phosphatase: 59 U/L (ref 38–126)
Anion gap: 12 (ref 5–15)
BUN: 14 mg/dL (ref 8–23)
CO2: 26 mmol/L (ref 22–32)
Calcium: 9 mg/dL (ref 8.9–10.3)
Chloride: 101 mmol/L (ref 98–111)
Creatinine, Ser: 0.99 mg/dL (ref 0.44–1.00)
GFR, Estimated: >60 mL/min (ref >60)
Glucose, Bld: 148 mg/dL — ABNORMAL HIGH (ref 70–99)
Potassium: 4 mmol/L (ref 3.5–5.1)
Sodium: 139 mmol/L (ref 135–145)
Total Bilirubin: <0.2 mg/dL (ref 0.0–1.2)
Total Protein: 6.2 g/dL — ABNORMAL LOW (ref 6.5–8.1)

## 2023-11-13 LAB — HEMOGLOBIN AND HEMATOCRIT, BLOOD
HCT: 22.8 % — ABNORMAL LOW (ref 36.0–46.0)
Hemoglobin: 7.2 g/dL — ABNORMAL LOW (ref 12.0–15.0)

## 2023-11-13 MED ORDER — ALBUTEROL SULFATE HFA 108 (90 BASE) MCG/ACT IN AERS
2.0000 | INHALATION_SPRAY | Freq: Once | RESPIRATORY_TRACT | Status: AC
  Administered 2023-11-13: 2 via RESPIRATORY_TRACT
  Filled 2023-11-13: qty 6.7

## 2023-11-13 MED ORDER — PANTOPRAZOLE SODIUM 40 MG IV SOLR
40.0000 mg | Freq: Once | INTRAVENOUS | Status: AC
  Administered 2023-11-13: 40 mg via INTRAVENOUS
  Filled 2023-11-13: qty 10

## 2023-11-13 MED ORDER — BENZONATATE 100 MG PO CAPS
100.0000 mg | ORAL_CAPSULE | Freq: Once | ORAL | Status: AC
  Administered 2023-11-13: 100 mg via ORAL
  Filled 2023-11-13: qty 1

## 2023-11-13 MED ORDER — IOHEXOL 300 MG/ML  SOLN
100.0000 mL | Freq: Once | INTRAMUSCULAR | Status: AC | PRN
  Administered 2023-11-13: 100 mL via INTRAVENOUS

## 2023-11-13 NOTE — ED Provider Triage Note (Signed)
 Emergency Medicine Provider Triage Evaluation Note  LAJUNE PERINE , a 73 y.o. female  was evaluated in triage.  Pt complains of bronchitis .  Review of Systems  Positive: Cough, wheezing, SoB Negative: Chest pain, vomiting  Physical Exam  BP 133/60 (BP Location: Right Arm)   Pulse 64   Temp 98.4 F (36.9 C) (Oral)   Resp 20   SpO2 98%  Gen:   Awake, no distress   Resp:  Normal effort Actively coughing during inspiration MSK:   Moves extremities without difficulty  Other:    Medical Decision Making  Medically screening exam initiated at 4:28 PM.  Appropriate orders placed.  NIKKA HAKIMIAN was informed that the remainder of the evaluation will be completed by another provider, this initial triage assessment does not replace that evaluation, and the importance of remaining in the ED until their evaluation is complete.  2-3 days of progressive wheezing, cough, SOB typical of "seasonal bronchitis" she gets every year. Reports fever 2 days ago on Day 1, none since. No vomiting. Does not have albuterol  at home. Denies h/o COPD or asthma.   Mandy Second, PA-C 11/13/23 1629

## 2023-11-13 NOTE — ED Triage Notes (Signed)
 Cough, illness since Sunday

## 2023-11-13 NOTE — Progress Notes (Incomplete)
 Hospitalist Transfer Note:    Nursing staff, Please call [facility] Admits & Consults System-Wide number on Amion [phone]) as soon as patient's arrival, so appropriate admitting provider can evaluate the pt.  Transferring facility: [facility] Requesting provider: Arthor Captain, PA (EDP at [facility]) Reason for transfer: admission for further evaluation and management of acute diverticulitis in the setting of failed outpatient antibiotics.     73 year old female  who presented to [facility] ED complaining of 3 weeks of progressive lower abdominal discomfort.  Approximately 1 week ago he had presented to his PCP with complaints of lower abdominal discomfort, at which time PCP was able to arrange for outpatient CT abdomen/pelvis which was suggestive of acute diverticulitis.  The patient subsequently completed a course of Cipro/Flagyl, but is noted further ensuing progression of his lower abdominal discomfort, prompting him to present to [facility] this evening.  Vital signs in the ED were notable for the following: Afebrile, heart rates in the 60s to 80s; systolic blood pressures in the 110s to 130s mmHg.   Imaging in the ED today notable for CT abdomen/pelvis with contrast, which showed acute diverticulitis without evidence of obstruction, perforation, or abscess.  Medications administered prior to transfer included the following: Zosyn   Subsequently, I accepted this patient for transfer for inpatient admission to a med/tele bed at [facility] or [facility]  (first available) for further work-up and management of the above.        Susan Pigg, DO Hospitalist

## 2023-11-13 NOTE — ED Provider Notes (Signed)
 [place] EMERGENCY DEPARTMENT AT [facility] Provider Note   CSN: [id number] Arrival date & time: 11/13/23  1527     History  Chief Complaint  Patient presents with   Cough    Susan Norris is a 73 y.o. female.  This is a 73 year old female who is here today for cough and fatigue.  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  She did start to have a cough a few days ago.  She also reports that she has been taking prednisone  and ibuprofen for hip arthritis.  A few weeks ago she had an episode of dark brown emesis.   Cough      Home Medications Prior to Admission medications   Medication Sig Start Date End Date Taking? Authorizing Provider  albuterol  (VENTOLIN  HFA) 108 (90 Base) MCG/ACT inhaler Inhale 2 puffs into the lungs every 2 (two) hours as needed for wheezing or shortness of breath (cough). 12/21/20   Muthersbaugh, Alisa App, PA-C  aspirin 81 MG EC tablet Take 81 mg by mouth daily. Swallow whole.    [provider]  Cholecalciferol (VITAMIN D3) 50000 units CAPS Take one capsule by mouth twice weekly. 04/16/17   [provider]  clonazePAM  (KLONOPIN ) 0.5 MG tablet Take 1 tablet (0.5 mg total) by mouth 2 (two) times daily as needed for anxiety. 05/18/21   Colene Dauphin, MD  escitalopram  (LEXAPRO ) 20 MG tablet Take 1 tablet (20 mg total) by mouth daily. 10/08/23   Colene Dauphin, MD  estradiol  (ESTRACE ) 1 MG tablet Take 1 tablet (1 mg total) by mouth daily. 10/08/23   Colene Dauphin, MD  fexofenadine (ALLEGRA) 180 MG tablet Take by mouth. 10/12/16   [provider]  fluticasone  (FLONASE ) 50 MCG/ACT nasal spray Place 2 sprays into both nostrils daily. 12/21/20   Muthersbaugh, Alisa App, PA-C  magnesium gluconate (MAGONATE) 500 MG tablet Take 500 mg by mouth daily.    [provider]  meclizine  (ANTIVERT ) 25 MG tablet Take 1 tablet (25 mg total) by mouth 3 (three) times  daily as needed for dizziness. 10/11/19   Carollynn Cirri, NP  omeprazole  (PRILOSEC) 40 MG capsule TAKE 1 CAPSULE (40 MG TOTAL) BY MOUTH DAILY. 11/01/23   Colene Dauphin, MD  rosuvastatin  (CRESTOR ) 10 MG tablet TAKE 1 TABLET BY MOUTH EVERY DAY 10/09/23   Colene Dauphin, MD      Allergies    Codeine    Review of Systems   Review of Systems  Respiratory:  Positive for cough.     Physical Exam Updated Vital Signs BP (!) 154/55   Pulse (!) 59   Temp 98.6 F (37 C) (Oral)   Resp 14   SpO2 95%  Physical Exam Vitals reviewed.  Constitutional:      Appearance: She is not toxic-appearing.  Cardiovascular:     Rate and Rhythm: Normal rate.     Pulses: Normal pulses.  Abdominal:     General: Abdomen is flat. There is no distension.     Palpations: Abdomen is soft.     Tenderness: There is no abdominal tenderness.  Musculoskeletal:        General: No swelling. Normal range of motion.  Skin:    General: Skin is warm.  Neurological:     General: No focal deficit present.     Mental Status: She is alert.     ED Results /  Procedures / Treatments   Labs (all labs ordered are listed, but only abnormal results are displayed) Labs Reviewed  CBC WITH DIFFERENTIAL/PLATELET - Abnormal; Notable for the following components:      Result Value   RBC 2.65 (*)    Hemoglobin 7.2 (*)    HCT 23.6 (*)    All other components within normal limits  COMPREHENSIVE METABOLIC PANEL WITH GFR - Abnormal; Notable for the following components:   Glucose, Bld 148 (*)    Total Protein 6.2 (*)    All other components within normal limits  HEMOGLOBIN AND HEMATOCRIT, BLOOD - Abnormal; Notable for the following components:   Hemoglobin 7.2 (*)    HCT 22.8 (*)    All other components within normal limits    EKG None  Radiology CT ABDOMEN PELVIS W CONTRAST Result Date: 11/13/2023 CLINICAL DATA:  Lower GI bleed, progressive wheezing, cough, shortness of breath typical of seasonal bronchitis she gets  every year. EXAM: CT ABDOMEN AND PELVIS WITH CONTRAST TECHNIQUE: Multidetector CT imaging of the abdomen and pelvis was performed using the standard protocol following bolus administration of intravenous contrast. RADIATION DOSE REDUCTION: This exam was performed according to the departmental dose-optimization program which includes automated exposure control, adjustment of the mA and/or kV according to patient size and/or use of iterative reconstruction technique. CONTRAST:  OMNIPAQUE  IOHEXOL  300 MG/ML  SOLN COMPARISON:  None Available. FINDINGS: Lower chest: No acute abnormality. Hepatobiliary: Unremarkable liver. Normal gallbladder. No biliary dilation. Pancreas: Unremarkable. Spleen: Unremarkable. Adrenals/Urinary Tract: Normal adrenal glands. No urinary calculi or hydronephrosis. Bladder is unremarkable. Stomach/Bowel: Normal caliber large and small bowel. Colonic diverticulosis without diverticulitis. No bowel wall thickening. The appendix is not visualized. Partially visualized moderate hiatal hernia. Vascular/Lymphatic: No significant vascular findings are present. No enlarged abdominal or pelvic lymph nodes. Reproductive: Unremarkable. Other: No free intraperitoneal fluid or air. Musculoskeletal: No acute fracture. IMPRESSION: 1. No acute abnormality in the abdomen or pelvis. 2.  Colonic diverticulosis without diverticulitis. 3. Partially visualized moderate hiatal hernia. Electronically Signed   By: Rozell Cornet M.D.   On: 11/13/2023 21:00   DG Chest 2 View Result Date: 11/13/2023 CLINICAL DATA:  Cough. EXAM: CHEST - 2 VIEW COMPARISON:  None Available. FINDINGS: The heart size and mediastinal contours are within normal limits. Both lungs are clear. The visualized skeletal structures are unremarkable. IMPRESSION: No active cardiopulmonary disease. Electronically Signed   By: Rosalene Colon M.D.   On: 11/13/2023 17:37    Procedures Procedures    Medications Ordered in ED Medications   albuterol  (VENTOLIN  HFA) 108 (90 Base) MCG/ACT inhaler 2 puff (2 puffs Inhalation Given 11/13/23 1634)  pantoprazole  (PROTONIX ) injection 40 mg (40 mg Intravenous Given 11/13/23 2055)  iohexol  (OMNIPAQUE ) 300 MG/ML solution 100 mL (100 mLs Intravenous Contrast Given 11/13/23 2034)    ED Course/ Medical Decision Making/ A&P                                 Medical Decision Making 73 year old female who is here today for cough, fatigue.  Plan- regarding the patient's cough, sounds mostly like viral syndrome.  She has clear lungs, normal vital signs.  More interesting on the patient is that she has had a 3 and half point hemoglobin drop over the last 1 month.  Patient does report taking increased amounts of NSAIDs and prednisone  over that time.  For hip pain.  She does report a single  episode of emesis that she described as being dark brown.  Says that she was taking Pepto-Bismol at the time, however would not expect that to be brown when vomited.  Will give the patient some Protonix .  Obtain CT imaging which did not show an acute process.  Have reached out to GI, patient will require admission for this substantial hemoglobin drop and symptomatic anemia.  Spoke with Dr. Bridgett Camps from [facility].  He agrees with admission.  Will admit to hospitalist.  Amount and/or Complexity of Data Reviewed Labs: ordered. Radiology: ordered.  Risk Prescription drug management.           Final Clinical Impression(s) / ED Diagnoses Final diagnoses:  Gastrointestinal hemorrhage, unspecified gastrointestinal hemorrhage type    Rx / DC Orders ED Discharge Orders     None         Nathanael Baker, DO 11/13/23 2149

## 2023-11-14 ENCOUNTER — Encounter: Payer: Self-pay | Admitting: Internal Medicine

## 2023-11-14 DIAGNOSIS — D649 Anemia, unspecified: Secondary | ICD-10-CM | POA: Diagnosis not present

## 2023-11-14 DIAGNOSIS — Z818 Family history of other mental and behavioral disorders: Secondary | ICD-10-CM | POA: Diagnosis not present

## 2023-11-14 DIAGNOSIS — Z83438 Family history of other disorder of lipoprotein metabolism and other lipidemia: Secondary | ICD-10-CM | POA: Diagnosis not present

## 2023-11-14 DIAGNOSIS — D509 Iron deficiency anemia, unspecified: Secondary | ICD-10-CM

## 2023-11-14 DIAGNOSIS — R051 Acute cough: Secondary | ICD-10-CM

## 2023-11-14 DIAGNOSIS — Z8349 Family history of other endocrine, nutritional and metabolic diseases: Secondary | ICD-10-CM | POA: Diagnosis not present

## 2023-11-14 DIAGNOSIS — Z90710 Acquired absence of both cervix and uterus: Secondary | ICD-10-CM | POA: Diagnosis not present

## 2023-11-14 DIAGNOSIS — Z8616 Personal history of COVID-19: Secondary | ICD-10-CM | POA: Diagnosis not present

## 2023-11-14 DIAGNOSIS — K648 Other hemorrhoids: Secondary | ICD-10-CM | POA: Diagnosis not present

## 2023-11-14 DIAGNOSIS — M1990 Unspecified osteoarthritis, unspecified site: Secondary | ICD-10-CM

## 2023-11-14 DIAGNOSIS — Z7982 Long term (current) use of aspirin: Secondary | ICD-10-CM | POA: Diagnosis not present

## 2023-11-14 DIAGNOSIS — W19XXXA Unspecified fall, initial encounter: Secondary | ICD-10-CM | POA: Diagnosis present

## 2023-11-14 DIAGNOSIS — Z833 Family history of diabetes mellitus: Secondary | ICD-10-CM | POA: Diagnosis not present

## 2023-11-14 DIAGNOSIS — D62 Acute posthemorrhagic anemia: Secondary | ICD-10-CM | POA: Diagnosis not present

## 2023-11-14 DIAGNOSIS — K5010 Crohn's disease of large intestine without complications: Secondary | ICD-10-CM

## 2023-11-14 DIAGNOSIS — I2510 Atherosclerotic heart disease of native coronary artery without angina pectoris: Secondary | ICD-10-CM | POA: Diagnosis not present

## 2023-11-14 DIAGNOSIS — F418 Other specified anxiety disorders: Secondary | ICD-10-CM | POA: Diagnosis not present

## 2023-11-14 DIAGNOSIS — K5520 Angiodysplasia of colon without hemorrhage: Secondary | ICD-10-CM | POA: Diagnosis not present

## 2023-11-14 DIAGNOSIS — Z823 Family history of stroke: Secondary | ICD-10-CM | POA: Diagnosis not present

## 2023-11-14 DIAGNOSIS — K922 Gastrointestinal hemorrhage, unspecified: Secondary | ICD-10-CM | POA: Diagnosis present

## 2023-11-14 DIAGNOSIS — F32A Depression, unspecified: Secondary | ICD-10-CM | POA: Diagnosis not present

## 2023-11-14 DIAGNOSIS — Z841 Family history of disorders of kidney and ureter: Secondary | ICD-10-CM | POA: Diagnosis not present

## 2023-11-14 DIAGNOSIS — Z825 Family history of asthma and other chronic lower respiratory diseases: Secondary | ICD-10-CM | POA: Diagnosis not present

## 2023-11-14 DIAGNOSIS — K5521 Angiodysplasia of colon with hemorrhage: Secondary | ICD-10-CM | POA: Diagnosis not present

## 2023-11-14 DIAGNOSIS — E785 Hyperlipidemia, unspecified: Secondary | ICD-10-CM | POA: Diagnosis not present

## 2023-11-14 DIAGNOSIS — K51211 Ulcerative (chronic) proctitis with rectal bleeding: Secondary | ICD-10-CM | POA: Diagnosis not present

## 2023-11-14 DIAGNOSIS — J45909 Unspecified asthma, uncomplicated: Secondary | ICD-10-CM | POA: Diagnosis not present

## 2023-11-14 DIAGNOSIS — M1610 Unilateral primary osteoarthritis, unspecified hip: Secondary | ICD-10-CM | POA: Diagnosis not present

## 2023-11-14 DIAGNOSIS — R7303 Prediabetes: Secondary | ICD-10-CM | POA: Diagnosis not present

## 2023-11-14 DIAGNOSIS — K449 Diaphragmatic hernia without obstruction or gangrene: Secondary | ICD-10-CM | POA: Diagnosis not present

## 2023-11-14 DIAGNOSIS — Z8249 Family history of ischemic heart disease and other diseases of the circulatory system: Secondary | ICD-10-CM | POA: Diagnosis not present

## 2023-11-14 DIAGNOSIS — Z8261 Family history of arthritis: Secondary | ICD-10-CM | POA: Diagnosis not present

## 2023-11-14 DIAGNOSIS — M8668 Other chronic osteomyelitis, other site: Secondary | ICD-10-CM | POA: Diagnosis not present

## 2023-11-14 DIAGNOSIS — Z862 Personal history of diseases of the blood and blood-forming organs and certain disorders involving the immune mechanism: Secondary | ICD-10-CM

## 2023-11-14 LAB — CBC
HCT: 22.7 % — ABNORMAL LOW (ref 36.0–46.0)
Hemoglobin: 6.9 g/dL — CL (ref 12.0–15.0)
MCH: 27.3 pg (ref 26.0–34.0)
MCHC: 30.4 g/dL (ref 30.0–36.0)
MCV: 89.7 fL (ref 80.0–100.0)
Platelets: 316 10*3/uL (ref 150–400)
RBC: 2.53 MIL/uL — ABNORMAL LOW (ref 3.87–5.11)
RDW: 14.6 % (ref 11.5–15.5)
WBC: 8.7 10*3/uL (ref 4.0–10.5)
nRBC: 0 % (ref 0.0–0.2)

## 2023-11-14 LAB — RESPIRATORY PANEL BY PCR

## 2023-11-14 LAB — FERRITIN: Ferritin: 8 ng/mL — ABNORMAL LOW (ref 11–307)

## 2023-11-14 LAB — IRON AND TIBC
Iron: 12 ug/dL — ABNORMAL LOW (ref 28–170)
Saturation Ratios: 3 % — ABNORMAL LOW (ref 10.4–31.8)
TIBC: 400 ug/dL (ref 250–450)
UIBC: 388 ug/dL

## 2023-11-14 LAB — PREPARE RBC (CROSSMATCH)

## 2023-11-14 LAB — VITAMIN B12: Vitamin B-12: 1312 pg/mL — ABNORMAL HIGH (ref 180–914)

## 2023-11-14 LAB — FOLATE: Folate: 10.7 ng/mL (ref >5.9)

## 2023-11-14 LAB — ABO/RH: ABO/RH(D): O POS

## 2023-11-14 MED ORDER — ACETAMINOPHEN 650 MG RE SUPP: 650.0000 mg | Freq: Four times a day (QID) | RECTAL | Status: DC | PRN

## 2023-11-14 MED ORDER — BENZONATATE 100 MG PO CAPS
100.0000 mg | ORAL_CAPSULE | Freq: Once | ORAL | Status: AC
  Administered 2023-11-14: 100 mg via ORAL
  Filled 2023-11-14: qty 1

## 2023-11-14 MED ORDER — ESCITALOPRAM OXALATE 20 MG PO TABS
20.0000 mg | ORAL_TABLET | Freq: Every day | ORAL | Status: DC
  Administered 2023-11-14 – 2023-11-16 (×3): 20 mg via ORAL
  Filled 2023-11-14 (×2): qty 1

## 2023-11-14 MED ORDER — GUAIFENESIN 100 MG/5ML PO LIQD
5.0000 mL | ORAL | Status: DC | PRN
Start: 2023-11-14 — End: 2023-11-15

## 2023-11-14 MED ORDER — PANTOPRAZOLE SODIUM 40 MG IV SOLR
40.0000 mg | Freq: Two times a day (BID) | INTRAVENOUS | Status: DC
  Administered 2023-11-14 – 2023-11-16 (×5): 40 mg via INTRAVENOUS
  Filled 2023-11-14 (×5): qty 10

## 2023-11-14 MED ORDER — SODIUM CHLORIDE 0.9 % IV SOLN: INTRAVENOUS | Status: DC

## 2023-11-14 MED ORDER — SODIUM CHLORIDE 0.9% IV SOLUTION: Freq: Once | INTRAVENOUS | Status: AC

## 2023-11-14 MED ORDER — ACETAMINOPHEN 325 MG PO TABS: 650.0000 mg | ORAL_TABLET | Freq: Four times a day (QID) | ORAL | Status: DC | PRN

## 2023-11-14 MED ORDER — PROCHLORPERAZINE EDISYLATE 10 MG/2ML IJ SOLN
5.0000 mg | Freq: Four times a day (QID) | INTRAMUSCULAR | Status: DC | PRN
  Administered 2023-11-15: 5 mg via INTRAVENOUS
  Filled 2023-11-14: qty 2

## 2023-11-14 MED ORDER — DICLOFENAC SODIUM 1 % EX GEL
2.0000 g | Freq: Four times a day (QID) | CUTANEOUS | Status: DC
  Filled 2023-11-14: qty 100

## 2023-11-14 MED ORDER — LORATADINE 10 MG PO TABS: 10.0000 mg | ORAL_TABLET | Freq: Every day | ORAL | Status: DC | PRN

## 2023-11-14 MED ORDER — ROSUVASTATIN CALCIUM 10 MG PO TABS
10.0000 mg | ORAL_TABLET | Freq: Every day | ORAL | Status: DC
  Administered 2023-11-14 – 2023-11-16 (×3): 10 mg via ORAL
  Filled 2023-11-14 (×2): qty 1

## 2023-11-14 MED ORDER — HYDROCODONE-ACETAMINOPHEN 5-325 MG PO TABS: 1.0000 | ORAL_TABLET | Freq: Four times a day (QID) | ORAL | Status: DC | PRN

## 2023-11-14 MED ORDER — ENOXAPARIN SODIUM 40 MG/0.4ML IJ SOSY: 40.0000 mg | PREFILLED_SYRINGE | INTRAMUSCULAR | Status: DC

## 2023-11-14 MED ORDER — LACTATED RINGERS IV SOLN: INTRAVENOUS | Status: DC

## 2023-11-14 MED ORDER — ALBUTEROL SULFATE (2.5 MG/3ML) 0.083% IN NEBU: 3.0000 mL | INHALATION_SOLUTION | RESPIRATORY_TRACT | Status: DC | PRN

## 2023-11-14 MED ORDER — SENNOSIDES-DOCUSATE SODIUM 8.6-50 MG PO TABS: 1.0000 | ORAL_TABLET | Freq: Every evening | ORAL | Status: DC | PRN

## 2023-11-14 MED ORDER — FLUTICASONE PROPIONATE 50 MCG/ACT NA SUSP
2.0000 | Freq: Every day | NASAL | Status: DC
Start: 2023-11-14 — End: 2023-11-16
  Administered 2023-11-14 – 2023-11-16 (×3): 2 via NASAL
  Filled 2023-11-14: qty 16

## 2023-11-14 NOTE — Progress Notes (Signed)
   11/14/23 1126  TOC Brief Assessment  Insurance and Status Reviewed  Patient has primary care physician Yes Donnette Gal, Beckey Bourgeois, MD)  Home environment has been reviewed yes home with spouse  Prior level of function: Independent  Prior/Current Home Services No current home services  Social Drivers of Health Review SDOH reviewed no interventions necessary  Readmission risk has been reviewed Yes  Transition of care needs no transition of care needs at this time

## 2023-11-14 NOTE — Consult Note (Signed)
 Consultation  Referring Provider:  Dr. Yvonne Hering Primary Care Physician:  Colene Dauphin, MD Primary Gastroenterologist:   Dr. Savannah Curlin      Reason for Consultation:   Iron Deficiency Anemia         HPI:   Susan Norris is a 73 y.o. female with a past medical history as listed below including asthma, anxiety and depression, Crohn's colitis, osteoarthritis, allergic rhinitis, CAD, HLD and prediabetes, who presented to the ER on 11/13/2023 with generalized weakness and a cough.  We are consulted in regards to iron deficiency anemia.    Patient initially presented to [facility] ED for evaluation of cough and generalized weakness.  Reported feeling fatigued and tired over the past 2 weeks.  Over the past 2 to 3 days started having a cough with worsening of weakness.  Also reports that 2 weeks prior she had an episode of vomiting dark-colored emesis but also in the setting of taking Pepto-Bismol.  She had been self-medicating with extra Prednisone  20 mg for osteoarthritis.  Also taken increased doses of NSAIDs.    Today, patient seen with best friend by her bedside, she explains that she has been feeling tired and run down but is the sole caretaker of her husband who has cancer.  He also recently had a fall and a hip replacement and just got out of rehab.  Describes that she has been a little dizzy and shortness of breath but really had not noticed this until being asked.  Does report that about 2 weeks ago she had what she thought was a food poisoning with some nausea and vomiting.  She took a lot of Pepto-Bismol and a few hours later she vomited and it was all black material.  This was only 1 time.  Also had a black stool around that time.  Over the past 2 weeks it was seen no further episodes of this.  Does admit that she is taking Prednisone  currently 20 mg as well as NSAIDs to help with some osteoarthritis.  Denies any current abdominal pain.  Other than feeling tired no real symptoms.  Does stay on  Omeprazole  40 mg daily.    Describes GI history of being diagnosed with ileitis when she was 73 years old, apparently on mesalamine for a period of time, but then stopped it.  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    Since being in the hospital she has had 1 unit of PRBCs.    Continues with some wheezing and upper respiratory symptoms from what she thinks is bronchitis.    Denies fever, chills or weight loss.  ED course:Hypertensive with SBP 1 30-1 80s, CBC with a hemoglobin of 7.2 (11.55-month ago)-received 1 unit PRBCs and transferred to [facility], iron low at 12, percent saturation 3, ferritin 8  GI history: 03/18/2020 EGD with normal esophagus, gastritis and small hiatal hernia, pathology showed chronic inflammation and GERD 03/18/2020 colonoscopy with entire colon normal, small internal hemorrhoids, one 4 mm polyp hepatic flexure, one 2 mm polyp in the cecum, pathology showed tubular adenomas; repeat recommended in 7 years  Past Medical History:  Diagnosis Date   Allergic rhinitis    Allergy    Allergy-induced asthma    Anxiety    Arthritis    knees    Asthma    Blood transfusion without reported diagnosis    at  73yo   CAD (coronary artery disease)    Cataract    removed bilateral eyes   COVID 12/21/2020   Crohn's colitis (HCC)    Depression    Fatty liver    GERD (gastroesophageal reflux disease)    History of chicken pox    History of shingles    HLD (hyperlipidemia)    Hyperlipidemia    Neoplasm of tongue    Osteopenia    Prediabetes    Rectocele    Sensorineural hearing loss    Vertigo    Vitamin D  deficiency     Past Surgical History:  Procedure Laterality Date   ABDOMINAL HYSTERECTOMY     total   CESAREAN SECTION     COLONOSCOPY  10/2015   completed in [facility]    OOPHORECTOMY Left    TONSILLECTOMY AND ADENOIDECTOMY     UPPER GASTROINTESTINAL ENDOSCOPY       Family History  Problem Relation Age of Onset   Diabetes Mother    Atrial fibrillation Mother    Thyroid  disease Mother    Arthritis Father    Asthma Father    Heart disease Father    Hyperlipidemia Father    Hypertension Father    Kidney disease Father    Diabetes Brother    Hypertension Brother    Depression Maternal Grandmother    Heart disease Maternal Grandfather    Hypertension Paternal Grandfather    Stroke Paternal Grandfather    Breast cancer Neg Hx    Colon cancer Neg Hx    Esophageal cancer Neg Hx    Rectal cancer Neg Hx    Stomach cancer Neg Hx     Social History   Tobacco Use   Smoking status: Never   Smokeless tobacco: Never  Vaping Use   Vaping status: Never Used  Substance Use Topics   Alcohol use: No   Drug use: No    Prior to Admission medications   Medication Sig Start Date End Date Taking? Authorizing Provider  albuterol  (VENTOLIN  HFA) 108 (90 Base) MCG/ACT inhaler Inhale 2 puffs into the lungs every 2 (two) hours as needed for wheezing or shortness of breath (cough). 12/21/20  Yes Muthersbaugh, Alisa App, PA-C  ascorbic acid (VITAMIN C) 500 MG tablet Take 1,000 mg by mouth daily.   Yes [provider]  aspirin 81 MG EC tablet Take 81 mg by mouth daily. Swallow whole.   Yes [provider]  Calcium  Carbonate-Vit D-Min (CALCIUM  1200 PO) Take 1,200 mg by mouth daily.   Yes [provider]  cetirizine (ZYRTEC) 10 MG tablet Take 10 mg by mouth daily. 11/03/23  Yes [provider]  Cholecalciferol (VITAMIN D3 PO) Take 250 mcg by mouth daily. 04/16/17  Yes [provider]  Coenzyme Q10 (COQ-10) 100 MG CAPS Take 100 mg by mouth daily.   Yes [provider]  Cranberry-Vitamin C (AZO CRANBERRY URINARY TRACT PO) Take 1 tablet by mouth daily.   Yes [provider]  Cyanocobalamin (B-12 PO) Take 500 mcg by mouth daily.   Yes [provider]  escitalopram  (LEXAPRO ) 20 MG tablet Take 1 tablet  (20 mg total) by mouth daily. 10/08/23  Yes Burns, Beckey Bourgeois, MD  estradiol  (ESTRACE ) 1 MG tablet Take 1 tablet (1 mg total) by mouth daily. 10/08/23  Yes Burns, Beckey Bourgeois, MD  fluticasone  (FLONASE ) 50 MCG/ACT nasal spray Place 2 sprays into both nostrils daily. Patient taking differently: Place 2 sprays into both nostrils daily as needed for  allergies. 12/21/20  Yes Muthersbaugh, Alisa App, PA-C  magnesium gluconate (MAGONATE) 500 MG tablet Take 500 mg by mouth daily.   Yes [provider]  meclizine  (ANTIVERT ) 25 MG tablet Take 1 tablet (25 mg total) by mouth 3 (three) times daily as needed for dizziness. 10/11/19  Yes Baity, Rankin Buzzard, NP  Omega-3 Fatty Acids (FISH OIL PO) Take 1,200 mg by mouth daily.   Yes [provider]  omeprazole  (PRILOSEC) 40 MG capsule TAKE 1 CAPSULE (40 MG TOTAL) BY MOUTH DAILY. 11/01/23  Yes Burns, Beckey Bourgeois, MD  rosuvastatin  (CRESTOR ) 10 MG tablet TAKE 1 TABLET BY MOUTH EVERY DAY Patient taking differently: Take 10 mg by mouth at bedtime. 10/09/23  Yes Burns, Beckey Bourgeois, MD  Turmeric (QC TUMERIC COMPLEX PO) Take 2,400 mg by mouth daily.   Yes [provider]  vitamin E 180 MG (400 UNITS) capsule Take 400 Units by mouth daily.   Yes [provider]  zinc gluconate 50 MG tablet Take 50 mg by mouth daily.   Yes [provider]    Current Facility-Administered Medications  Medication Dose Route Frequency Provider Last Rate Last Admin   acetaminophen  (TYLENOL ) tablet 650 mg  650 mg Oral Q6H PRN Amponsah, Prosper M, MD       Or   acetaminophen  (TYLENOL ) suppository 650 mg  650 mg Rectal Q6H PRN Vita Grip, MD       albuterol  (PROVENTIL ) (2.5 MG/3ML) 0.083% nebulizer solution 3 mL  3 mL Inhalation Q2H PRN Vita Grip, MD       diclofenac  Sodium (VOLTAREN ) 1 % topical gel 2 g  2 g Topical QID Vita Grip, MD       escitalopram  (LEXAPRO ) tablet 20 mg  20 mg Oral Daily Amponsah, Prosper M, MD       fluticasone  (FLONASE ) 50  MCG/ACT nasal spray 2 spray  2 spray Each Nare Daily Amponsah, Annette Killings, MD       guaiFENesin  (ROBITUSSIN) 100 MG/5ML liquid 5 mL  5 mL Oral Q4H PRN Amponsah, Prosper M, MD       HYDROcodone -acetaminophen  (NORCO/VICODIN) 5-325 MG per tablet 1 tablet  1 tablet Oral Q6H PRN Vita Grip, MD       lactated ringers  infusion   Intravenous Continuous Bary Boss, DO   Stopped at 11/14/23 1205   loratadine  (CLARITIN ) tablet 10 mg  10 mg Oral Daily PRN Amponsah, Prosper M, MD       pantoprazole  (PROTONIX ) injection 40 mg  40 mg Intravenous BID Reesa Cannon N, DO   40 mg at 11/14/23 0458   prochlorperazine  (COMPAZINE ) injection 5 mg  5 mg Intravenous Q6H PRN Reesa Cannon N, DO       rosuvastatin  (CRESTOR ) tablet 10 mg  10 mg Oral Daily Vita Grip, MD       senna-docusate (Senokot-S) tablet 1 tablet  1 tablet Oral QHS PRN Vita Grip, MD        Allergies as of 11/13/2023 - Review Complete 11/13/2023  Allergen Reaction Noted   Codeine Itching 04/26/2017     Review of Systems:    Constitutional: No weight loss, fever or chills Skin: No rash  Cardiovascular: No chest pain   Respiratory:See HPI Gastrointestinal: See HPI and otherwise negative Genitourinary: No dysuria Neurological: No headache, dizziness or syncope Musculoskeletal: No new muscle or joint pain Hematologic: No bruising Psychiatric: No history of depression or anxiety    Physical Exam:  Vital signs in last  24 hours: Temp:  [97.9 F (36.6 C)-99.2 F (37.3 C)] 98.9 F (37.2 C) (04/23 1217) Pulse Rate:  [52-68] 57 (04/23 1217) Resp:  [12-22] 16 (04/23 1217) BP: (133-186)/(55-79) 151/60 (04/23 1217) SpO2:  [92 %-98 %] 96 % (04/23 1153) Weight:  [84.3 kg] 84.3 kg (04/22 2346) Last BM Date : 11/13/23 General:   Pleasant Caucasian female appears to be in NAD, Well developed, Well nourished, alert and cooperative Head:  Normocephalic and atraumatic. Eyes:   PEERL, EOMI. No icterus. Conjunctiva  pink. Ears:  Normal auditory acuity. Neck:  Supple Throat: Oral cavity and pharynx without inflammation, swelling or lesion. Teeth in good condition. Lungs: Respirations even and unlabored. Lungs clear to auscultation bilaterally.   +coarse breath sound b/l, scattered wheezes Heart: Normal S1, S2. No MRG. Regular rate and rhythm. No peripheral edema, cyanosis or pallor.  Abdomen:  Soft, nondistended, nontender. No rebound or guarding. Normal bowel sounds. No appreciable masses or hepatomegaly. Rectal:  Not performed.  Msk:  Symmetrical without gross deformities. Peripheral pulses intact.  Extremities:  Without edema, no deformity or joint abnormality.  Neurologic:  Alert and  oriented x4;  grossly normal neurologically.  Skin:   Dry and intact without significant lesions or rashes. Psychiatric: Demonstrates good judgement and reason without abnormal affect or behaviors.   LAB RESULTS: Recent Labs    11/13/23 1638 11/13/23 2053 11/14/23 0720  WBC 9.5  --  8.7  HGB 7.2* 7.2* 6.9*  HCT 23.6* 22.8* 22.7*  PLT 331  --  316   BMET Recent Labs    11/13/23 1638  NA 139  K 4.0  CL 101  CO2 26  GLUCOSE 148*  BUN 14  CREATININE 0.99  CALCIUM  9.0   LFT Recent Labs    11/13/23 1638  PROT 6.2*  ALBUMIN 3.9  AST 17  ALT 8  ALKPHOS 59  BILITOT <0.2   STUDIES: CT ABDOMEN PELVIS W CONTRAST Result Date: 11/13/2023 CLINICAL DATA:  Lower GI bleed, progressive wheezing, cough, shortness of breath typical of seasonal bronchitis she gets every year. EXAM: CT ABDOMEN AND PELVIS WITH CONTRAST TECHNIQUE: Multidetector CT imaging of the abdomen and pelvis was performed using the standard protocol following bolus administration of intravenous contrast. RADIATION DOSE REDUCTION: This exam was performed according to the departmental dose-optimization program which includes automated exposure control, adjustment of the mA and/or kV according to patient size and/or use of iterative reconstruction  technique. CONTRAST:  OMNIPAQUE  IOHEXOL  300 MG/ML  SOLN COMPARISON:  None Available. FINDINGS: Lower chest: No acute abnormality. Hepatobiliary: Unremarkable liver. Normal gallbladder. No biliary dilation. Pancreas: Unremarkable. Spleen: Unremarkable. Adrenals/Urinary Tract: Normal adrenal glands. No urinary calculi or hydronephrosis. Bladder is unremarkable. Stomach/Bowel: Normal caliber large and small bowel. Colonic diverticulosis without diverticulitis. No bowel wall thickening. The appendix is not visualized. Partially visualized moderate hiatal hernia. Vascular/Lymphatic: No significant vascular findings are present. No enlarged abdominal or pelvic lymph nodes. Reproductive: Unremarkable. Other: No free intraperitoneal fluid or air. Musculoskeletal: No acute fracture. IMPRESSION: 1. No acute abnormality in the abdomen or pelvis. 2.  Colonic diverticulosis without diverticulitis. 3. Partially visualized moderate hiatal hernia. Electronically Signed   By: Rozell Cornet M.D.   On: 11/13/2023 21:00   DG Chest 2 View Result Date: 11/13/2023 CLINICAL DATA:  Cough. EXAM: CHEST - 2 VIEW COMPARISON:  None Available. FINDINGS: The heart size and mediastinal contours are within normal limits. Both lungs are clear. The visualized skeletal structures are unremarkable. IMPRESSION: No active cardiopulmonary disease. Electronically Signed  By: Rosalene Colon M.D.   On: 11/13/2023 17:37    Impression / Plan:   Impression: 1.  Symptomatic iron deficiency anemia: Presented with progressive fatigue over the past couple of weeks and history of NSAID/steroid use found to have a 4 point drop in hemoglobin over the past month, given 1 unit PRBCs on admission and started on IV PPI, history of 1 episode of hematemesis of a black material as well as 1 episode of melena after taking Pepto-Bismol about 2 weeks ago, none since, last colonoscopy in 2021 with tubular adenomas and otherwise normal, EGD at the same time with  gastritis; consider gastritis +/- PUD from NSAID use 2.  Osteoarthritis: Has been self-medicating with NSAIDs and prednisone  at home  Plan: 1.  Plan for EGD tomorrow with Dr. Yvone Herd.  Did discuss risks, benefits, limitations and alternatives and the patient agrees to proceed. 2.  Patient will be on clear liquid diet today and n.p.o. at midnight 3.  Agree with IV PPI Pantoprazole  40mg  BID 4.  Continue to monitor hemoglobin and transfuse as needed less than 7 5.  Patient would benefit from iron infusion while in the hospital  Thank you for your kind consultation, we will continue to follow.  Kathy Parker Weslynn Ke  11/14/2023, 3:13 PM

## 2023-11-14 NOTE — H&P (Signed)
 History and Physical  MICHIAH MASSE EPP:[id number] DOB: 03/12/1951 DOA: 11/13/2023  PCP: Colene Dauphin, MD   Chief Complaint: Generalized weakness, cough  HPI: Susan Norris is a 73 y.o. female with medical history significant for asthma, anxiety and depression, Crohn's colitis, osteoarthritis, allergic rhinitis, CAD, HLD and prediabetes who initially presented to the [facility] for evaluation of cough and generalized weakness. 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  Over the last 2 to 3 days, she started having a cough with worsening of her weakness.  She denies any shortness of breath, chest pain, abdominal pain, dysuria, hematuria, melena, hematochezia or hemoptysis.  She does report that 2 weeks ago, she ate shakes daily and vomited large-volume dark-colored emesis but this was also in the setting of taking Pepto-Bismol. Of note, due to her worsening hip osteoarthritis, she has been self-medicating with extra prednisone  20 mg with plan to wean down.  She has also taken increased doses of NSAIDs.  ED Course: Vital signs notable for hypertensive with SBP in the 130s to 180s but otherwise normal Labs were notable for CBC showing hemoglobin 7.2, compared to most recent prior value of 11.8 1 month ago.  GI was consulted for evaluation and recommended admission to [facility] to [facility].  1 unit PRBC was ordered.  TRH was consulted for admission and patient was transferred to [facility] service.  Review of Systems: Please see HPI for pertinent positives and negatives. A complete 10 system review of systems are otherwise negative.  Past Medical History:  Diagnosis Date   Allergic rhinitis    Allergy    Allergy-induced asthma    Anxiety    Arthritis    knees    Asthma    Blood transfusion without reported diagnosis    at 73yo   CAD (coronary artery disease)    Cataract    removed bilateral eyes    COVID 12/21/2020   Crohn's colitis (HCC)    Depression    Fatty liver    GERD (gastroesophageal reflux disease)    History of chicken pox    History of shingles    HLD (hyperlipidemia)    Hyperlipidemia    Neoplasm of tongue    Osteopenia    Prediabetes    Rectocele    Sensorineural hearing loss    Vertigo    Vitamin D  deficiency    Past Surgical History:  Procedure Laterality Date   ABDOMINAL HYSTERECTOMY     total   CESAREAN SECTION     COLONOSCOPY  10/2015   completed in [facility]    OOPHORECTOMY Left    TONSILLECTOMY AND ADENOIDECTOMY     UPPER GASTROINTESTINAL ENDOSCOPY     Social History:  reports that she has never smoked. She has never used smokeless tobacco. She reports that she does not drink alcohol and does not use drugs.  Allergies  Allergen Reactions   Codeine Itching    Family History  Problem Relation Age of Onset   Diabetes Mother    Atrial fibrillation Mother    Thyroid  disease Mother    Arthritis Father    Asthma Father    Heart disease Father    Hyperlipidemia Father    Hypertension Father    Kidney disease Father    Diabetes Brother    Hypertension Brother    Depression Maternal Grandmother    Heart disease Maternal Grandfather  Hypertension Paternal Grandfather    Stroke Paternal Grandfather    Breast cancer Neg Hx    Colon cancer Neg Hx    Esophageal cancer Neg Hx    Rectal cancer Neg Hx    Stomach cancer Neg Hx      Prior to Admission medications   Medication Sig Start Date End Date Taking? Authorizing Provider  albuterol  (VENTOLIN  HFA) 108 (90 Base) MCG/ACT inhaler Inhale 2 puffs into the lungs every 2 (two) hours as needed for wheezing or shortness of breath (cough). 12/21/20   Muthersbaugh, Alisa App, PA-C  aspirin 81 MG EC tablet Take 81 mg by mouth daily. Swallow whole.    [provider]  Cholecalciferol (VITAMIN D3) 50000 units CAPS Take one capsule by mouth twice weekly. 04/16/17   [provider]  clonazePAM   (KLONOPIN ) 0.5 MG tablet Take 1 tablet (0.5 mg total) by mouth 2 (two) times daily as needed for anxiety. 05/18/21   Colene Dauphin, MD  escitalopram  (LEXAPRO ) 20 MG tablet Take 1 tablet (20 mg total) by mouth daily. 10/08/23   Colene Dauphin, MD  estradiol  (ESTRACE ) 1 MG tablet Take 1 tablet (1 mg total) by mouth daily. 10/08/23   Colene Dauphin, MD  fexofenadine (ALLEGRA) 180 MG tablet Take by mouth. 10/12/16   [provider]  fluticasone  (FLONASE ) 50 MCG/ACT nasal spray Place 2 sprays into both nostrils daily. 12/21/20   Muthersbaugh, Alisa App, PA-C  magnesium gluconate (MAGONATE) 500 MG tablet Take 500 mg by mouth daily.    [provider]  meclizine  (ANTIVERT ) 25 MG tablet Take 1 tablet (25 mg total) by mouth 3 (three) times daily as needed for dizziness. 10/11/19   Carollynn Cirri, NP  omeprazole  (PRILOSEC) 40 MG capsule TAKE 1 CAPSULE (40 MG TOTAL) BY MOUTH DAILY. 11/01/23   Colene Dauphin, MD  rosuvastatin  (CRESTOR ) 10 MG tablet TAKE 1 TABLET BY MOUTH EVERY DAY 10/09/23   Colene Dauphin, MD    Physical Exam: BP 137/68 (BP Location: Right Arm)   Pulse (!) 57   Temp 99 F (37.2 C) (Oral)   Resp 17   Ht 5\' 4"  (1.626 m)   Wt 84.3 kg   SpO2 93%   BMI 31.90 kg/m  General: Pleasant, tired appearing elderly woman laying in bed. No acute distress. HEENT: [place]/AT. Anicteric sclera.  Dry mucous membrane.  Pale conjunctivae. CV: Mild bradycardia.  Regular rhythm. No murmurs, rubs, or gallops. No LE edema Pulmonary: Lungs CTAB. Normal effort. No wheezing or rales. Abdominal: Soft, nontender, nondistended. Normal bowel sounds. Extremities: Palpable radial and DP pulses. Normal ROM. Skin: Warm and dry. No obvious rash or lesions. Neuro: A&Ox3. Moves all extremities. Normal sensation to light touch. No focal deficit.         Labs on Admission:  Basic Metabolic Panel: Recent Labs  Lab 11/13/23 1638  NA 139  K 4.0  CL 101  CO2 26  GLUCOSE 148*  BUN 14  CREATININE 0.99   CALCIUM  9.0   Liver Function Tests: Recent Labs  Lab 11/13/23 1638  AST 17  ALT 8  ALKPHOS 59  BILITOT <0.2  PROT 6.2*  ALBUMIN 3.9   No results for input(s): "LIPASE", "AMYLASE" in the last 168 hours. No results for input(s): "AMMONIA" in the last 168 hours. CBC: Recent Labs  Lab 11/13/23 1638 11/13/23 2053  WBC 9.5  --   NEUTROABS 5.8  --   HGB 7.2* 7.2*  HCT 23.6* 22.8*  MCV 89.1  --  PLT 331  --    Cardiac Enzymes: No results for input(s): "CKTOTAL", "CKMB", "CKMBINDEX", "TROPONINI" in the last 168 hours. BNP (last 3 results) No results for input(s): "BNP" in the last 8760 hours.  ProBNP (last 3 results) No results for input(s): "PROBNP" in the last 8760 hours.  CBG: No results for input(s): "GLUCAP" in the last 168 hours.  Radiological Exams on Admission: CT ABDOMEN PELVIS W CONTRAST Result Date: 11/13/2023 CLINICAL DATA:  Lower GI bleed, progressive wheezing, cough, shortness of breath typical of seasonal bronchitis she gets every year. EXAM: CT ABDOMEN AND PELVIS WITH CONTRAST TECHNIQUE: Multidetector CT imaging of the abdomen and pelvis was performed using the standard protocol following bolus administration of intravenous contrast. RADIATION DOSE REDUCTION: This exam was performed according to the departmental dose-optimization program which includes automated exposure control, adjustment of the mA and/or kV according to patient size and/or use of iterative reconstruction technique. CONTRAST:  OMNIPAQUE  IOHEXOL  300 MG/ML  SOLN COMPARISON:  None Available. FINDINGS: Lower chest: No acute abnormality. Hepatobiliary: Unremarkable liver. Normal gallbladder. No biliary dilation. Pancreas: Unremarkable. Spleen: Unremarkable. Adrenals/Urinary Tract: Normal adrenal glands. No urinary calculi or hydronephrosis. Bladder is unremarkable. Stomach/Bowel: Normal caliber large and small bowel. Colonic diverticulosis without diverticulitis. No bowel wall thickening. The  appendix is not visualized. Partially visualized moderate hiatal hernia. Vascular/Lymphatic: No significant vascular findings are present. No enlarged abdominal or pelvic lymph nodes. Reproductive: Unremarkable. Other: No free intraperitoneal fluid or air. Musculoskeletal: No acute fracture. IMPRESSION: 1. No acute abnormality in the abdomen or pelvis. 2.  Colonic diverticulosis without diverticulitis. 3. Partially visualized moderate hiatal hernia. Electronically Signed   By: Rozell Cornet M.D.   On: 11/13/2023 21:00   DG Chest 2 View Result Date: 11/13/2023 CLINICAL DATA:  Cough. EXAM: CHEST - 2 VIEW COMPARISON:  None Available. FINDINGS: The heart size and mediastinal contours are within normal limits. Both lungs are clear. The visualized skeletal structures are unremarkable. IMPRESSION: No active cardiopulmonary disease. Electronically Signed   By: Rosalene Colon M.D.   On: 11/13/2023 17:37   Assessment/Plan Susan Norris is a 73 y.o. female with medical history significant for asthma, anxiety and depression, Crohn's colitis, osteoarthritis, allergic rhinitis, CAD, HLD and prediabetes who initially presented to the [facility] for evaluation of cough and generalized weakness and admitted for symptomatic anemia in the setting of possible GI bleed.  # Symptomatic anemia # GI bleed - Presented with progressive fatigue and recent increase use of NSAIDs and steroids - Found to have 4 point drop in hemoglobin in the last month - Hemodynamically stable, denies any melena, hematochezia or hematemesis - Concern for possible upper GI bleed, will likely need EGD - Transfuse 1 unit PRBC, follow up posttransfusion H&H - IV PPI 40 mg twice daily - Continue IV LR 75 cc/h for now - Follow-up iron studies, ferritin, folate and vitamin B12 - GI following, appreciate recs  # Cough - Reports she presented to the ED due to cough and generalized malaise - Continues to have dry cough during encounter - Check  full respiratory panel - As needed Robitussin for cough  # Osteoarthritis - Reports worsening hip osteoarthritis in the setting of taking care of her husband - Has been self-medicating with home prednisone  and NSAIDs - Start Voltaren  gel, PRN Norco for pain  # HLD - Continue rosuvastatin  - Lipid panel 1 month ago showed LDL of 50  # Prediabetes - Last A1c 6.1% 1 month ago  # Anxiety and  depression - Continue Lexapro   # Allergic rhinitis - Continue Flonase  and as needed cetirizine   DVT prophylaxis: SCDs    Code Status: Full Code  Consults called: GI  Family Communication: No family at bedside  Severity of Illness: The appropriate patient status for this patient is INPATIENT. Inpatient status is judged to be reasonable and necessary in order to provide the required intensity of service to ensure the patient's safety. The patient's presenting symptoms, physical exam findings, and initial radiographic and laboratory data in the context of their chronic comorbidities is felt to place them at high risk for further clinical deterioration. Furthermore, it is not anticipated that the patient will be medically stable for discharge from the hospital within 2 midnights of admission.   * I certify that at the point of admission it is my clinical judgment that the patient will require inpatient hospital care spanning beyond 2 midnights from the point of admission due to high intensity of service, high risk for further deterioration and high frequency of surveillance required.*  Level of care: Telemetry   This record has been created using Conservation officer, historic buildings. Errors have been sought and corrected, but may not always be located. Such creation errors do not reflect on the standard of care.   Vita Grip, MD 11/14/2023, 7:19 AM [facility] Pager: [phone] Isaiah 41:10   If 7PM-7AM, please contact night-coverage [email or web address] Password [email or web address]

## 2023-11-15 ENCOUNTER — Encounter (HOSPITAL_COMMUNITY): Payer: Self-pay | Admitting: Student

## 2023-11-15 ENCOUNTER — Inpatient Hospital Stay (HOSPITAL_COMMUNITY)

## 2023-11-15 ENCOUNTER — Encounter (HOSPITAL_COMMUNITY)
Admission: EM | Disposition: A | Discharge status: Home / Self Care | Payer: Self-pay | Source: Home / Self Care | Attending: Family Medicine

## 2023-11-15 DIAGNOSIS — D62 Acute posthemorrhagic anemia: Secondary | ICD-10-CM

## 2023-11-15 DIAGNOSIS — K922 Gastrointestinal hemorrhage, unspecified: Secondary | ICD-10-CM | POA: Diagnosis not present

## 2023-11-15 DIAGNOSIS — K449 Diaphragmatic hernia without obstruction or gangrene: Secondary | ICD-10-CM | POA: Diagnosis not present

## 2023-11-15 DIAGNOSIS — D649 Anemia, unspecified: Secondary | ICD-10-CM | POA: Diagnosis not present

## 2023-11-15 HISTORY — PX: ENTEROSCOPY: SHX5533

## 2023-11-15 LAB — BASIC METABOLIC PANEL WITH GFR
Anion gap: 8 (ref 5–15)
BUN: 9 mg/dL (ref 8–23)
CO2: 25 mmol/L (ref 22–32)
Calcium: 8.4 mg/dL — ABNORMAL LOW (ref 8.9–10.3)
Chloride: 103 mmol/L (ref 98–111)
Creatinine, Ser: 0.55 mg/dL (ref 0.44–1.00)
GFR, Estimated: >60 mL/min (ref >60)
Glucose, Bld: 88 mg/dL (ref 70–99)
Potassium: 3.7 mmol/L (ref 3.5–5.1)
Sodium: 136 mmol/L (ref 135–145)

## 2023-11-15 LAB — CBC
HCT: 27.5 % — ABNORMAL LOW (ref 36.0–46.0)
Hemoglobin: 8.3 g/dL — ABNORMAL LOW (ref 12.0–15.0)
MCH: 27.4 pg (ref 26.0–34.0)
MCHC: 30.2 g/dL (ref 30.0–36.0)
MCV: 90.8 fL (ref 80.0–100.0)
Platelets: 318 10*3/uL (ref 150–400)
RBC: 3.03 MIL/uL — ABNORMAL LOW (ref 3.87–5.11)
RDW: 14.6 % (ref 11.5–15.5)
WBC: 9.5 10*3/uL (ref 4.0–10.5)
nRBC: 0 % (ref 0.0–0.2)

## 2023-11-15 LAB — BPAM RBC
Blood Product Expiration Date: 202505262359
ISSUE DATE / TIME: 202504231158
Unit Type and Rh: 5100

## 2023-11-15 LAB — TYPE AND SCREEN
ABO/RH(D): O POS
Antibody Screen: NEGATIVE
Unit division: 0

## 2023-11-15 SURGERY — ENTEROSCOPY
Anesthesia: Monitor Anesthesia Care

## 2023-11-15 MED ORDER — NA SULFATE-K SULFATE-MG SULF 17.5-3.13-1.6 GM/177ML PO SOLN
0.5000 | Freq: Once | ORAL | Status: AC
  Administered 2023-11-15: 177 mL via ORAL

## 2023-11-15 MED ORDER — SPOT INK MARKER SYRINGE KIT
PACK | SUBMUCOSAL | Status: DC | PRN
  Administered 2023-11-15: 1 mL via SUBMUCOSAL

## 2023-11-15 MED ORDER — BENZONATATE 100 MG PO CAPS
200.0000 mg | ORAL_CAPSULE | Freq: Three times a day (TID) | ORAL | Status: DC | PRN
  Administered 2023-11-15 (×2): 200 mg via ORAL
  Filled 2023-11-15 (×3): qty 2

## 2023-11-15 MED ORDER — NA SULFATE-K SULFATE-MG SULF 17.5-3.13-1.6 GM/177ML PO SOLN: 0.5000 | Freq: Once | ORAL | Status: DC

## 2023-11-15 MED ORDER — SODIUM CHLORIDE 0.9 % IV SOLN: INTRAVENOUS | Status: AC

## 2023-11-15 MED ORDER — NA SULFATE-K SULFATE-MG SULF 17.5-3.13-1.6 GM/177ML PO SOLN
0.5000 | Freq: Once | ORAL | Status: DC
  Filled 2023-11-15: qty 1

## 2023-11-15 MED ORDER — BISACODYL 5 MG PO TBEC
10.0000 mg | DELAYED_RELEASE_TABLET | Freq: Once | ORAL | Status: AC
  Administered 2023-11-15: 10 mg via ORAL
  Filled 2023-11-15: qty 2

## 2023-11-15 MED ORDER — LIDOCAINE HCL (PF) 2 % IJ SOLN
INTRAMUSCULAR | Status: DC | PRN
  Administered 2023-11-15: 80 mg via INTRADERMAL

## 2023-11-15 MED ORDER — NA SULFATE-K SULFATE-MG SULF 17.5-3.13-1.6 GM/177ML PO SOLN
0.5000 | Freq: Once | ORAL | Status: DC
Start: 2023-11-15 — End: 2023-11-15
  Filled 2023-11-15 (×2): qty 1

## 2023-11-15 MED ORDER — NA SULFATE-K SULFATE-MG SULF 17.5-3.13-1.6 GM/177ML PO SOLN
0.5000 | Freq: Once | ORAL | Status: AC
Start: 2023-11-15 — End: 2023-11-15
  Administered 2023-11-15: 177 mL via ORAL
  Filled 2023-11-15: qty 1

## 2023-11-15 MED ORDER — PROPOFOL 500 MG/50ML IV EMUL
INTRAVENOUS | Status: DC | PRN
  Administered 2023-11-15: 30 mg via INTRAVENOUS
  Administered 2023-11-15: 40 mg via INTRAVENOUS
  Administered 2023-11-15: 200 ug/kg/min via INTRAVENOUS

## 2023-11-15 NOTE — Anesthesia Preprocedure Evaluation (Addendum)
 Anesthesia Evaluation  Patient identified by MRN, date of birth, ID band Patient awake    Reviewed: Allergy & Precautions, H&P , NPO status , Patient's Chart, lab work & pertinent test results  Airway Mallampati: II  TM Distance: >3 FB Neck ROM: Full    Dental no notable dental hx.    Pulmonary asthma    Pulmonary exam normal breath sounds clear to auscultation       Cardiovascular + CAD  Normal cardiovascular exam Rhythm:Regular Rate:Normal     Neuro/Psych  PSYCHIATRIC DISORDERS Anxiety Depression    negative neurological ROS     GI/Hepatic Neg liver ROS, hiatal hernia,GERD  ,, Upper GI Bleed   Endo/Other  negative endocrine ROS    Renal/GU negative Renal ROS  negative genitourinary   Musculoskeletal  (+) Arthritis ,    Abdominal   Peds negative pediatric ROS (+)  Hematology  (+) Blood dyscrasia, anemia Hg 8.3   Anesthesia Other Findings   Reproductive/Obstetrics negative OB ROS                              Anesthesia Physical Anesthesia Plan  ASA: 3  Anesthesia Plan: MAC   Post-op Pain Management:    Induction: Intravenous  PONV Risk Score and Plan: Propofol  infusion and Treatment may vary due to age or medical condition  Airway Management Planned: Natural Airway  Additional Equipment:   Intra-op Plan:   Post-operative Plan:   Informed Consent: I have reviewed the patients History and Physical, chart, labs and discussed the procedure including the risks, benefits and alternatives for the proposed anesthesia with the patient or authorized representative who has indicated his/her understanding and acceptance.     Dental advisory given  Plan Discussed with: CRNA  Anesthesia Plan Comments:          Anesthesia Quick Evaluation

## 2023-11-15 NOTE — Op Note (Signed)
 [facility] Patient Name: Susan Norris Procedure Date: 11/15/2023 MRN: [id number] Attending MD: Eugenia Hess , MD, [id number] Date of Birth: February 06, 1951 CSN: [id number] Age: 73 Admit Type: Outpatient Procedure:                Small bowel enteroscopy Indications:              Anemia, History of Crohn's disease, Concern for                            upper GI bleeding Providers:                Eugenia Hess, MD, Golda Latch, RN, Judith Novak,                            Technician Referring MD:              Medicines:                Monitored Anesthesia Care Complications:            No immediate complications. Estimated blood loss:                            None. Estimated Blood Loss:     Estimated blood loss: none. Procedure:                Pre-Anesthesia Assessment:                           - Prior to the procedure, a History and Physical                            was performed, and patient medications and                            allergies were reviewed. The patient's tolerance of                            previous anesthesia was also reviewed. The risks                            and benefits of the procedure and the sedation                            options and risks were discussed with the patient.                            All questions were answered, and informed consent                            was obtained. Prior Anticoagulants: The patient has                            taken no anticoagulant or antiplatelet agents. ASA                            Grade Assessment: III -  A patient with severe                            systemic disease. After reviewing the risks and                            benefits, the patient was deemed in satisfactory                            condition to undergo the procedure.                           After obtaining informed consent, the endoscope was                            passed under direct vision. Throughout the                             procedure, the patient's blood pressure, pulse, and                            oxygen saturations were monitored continuously. The                            [id number]-[id number] ([id number]) Olympus endoscope was introduced                            through the mouth, and advanced to the. The                            [id number]-[id number] ([id number]) Olympus colonoscope was                            introduced through the mouth and advanced to the                            proximal jejunum. After obtaining informed consent,                            the endoscope was passed under direct vision.                            Throughout the procedure, the patient's blood                            pressure, pulse, and oxygen saturations were                            monitored continuously.The small bowel enteroscopy                            was accomplished without difficulty. The patient                            tolerated the  procedure well. Scope In: Scope Out: Findings:      The examined esophagus was normal.      The gastric body, gastric antrum, cardia (on retroflexion) and gastric       fundus (on retroflexion) were normal.      A small hiatal hernia was present.      There was no evidence of significant pathology in the entire examined       duodenum.      There was no evidence of significant pathology in the proximal jejunum.       Area was tattooed with an injection of 0.5 mL of Spot (carbon black) at       maximum site of insertion in the jejunum.      There was no active bleeding or stigmata of recent bleeding identified       during the exam. Impression:               - Normal esophagus.                           - Normal gastric body, antrum, cardia and gastric                            fundus.                           - Small hiatal hernia.                           - Normal examined duodenum.                           - The examined portion of the jejunum was  normal.                            Tattooed.                           - No specimens collected. Recommendation:           - Return patient to hospital ward for ongoing care.                           - Continue to monitor serial H&H every 8-12 hours                           - May discontinue IV PPI given absence of GI                            bleeding in the stomach                           - Can resume home dose of PPI with omeprazole  40 mg                            orally daily or pantoprazole  40 mg orally daily                           -  Patient warrants colonoscopy for further                            investigation of anemia. Prior to today's                            examination she inquired if that could be performed                            on an outpatient basis. Discussed that if her blood                            counts were stable and no evidence of overt                            bleeding it could be reasonable to perform                            colonoscopy in the outpatient setting. If, however,                            she manifest decline in hemoglobin in the hospital                            then it would be prudent to expedite her procedure                            to the inpatient setting. Procedure Code(s):        --- Professional ---                           [phone], Small intestinal endoscopy, enteroscopy                            beyond second portion of duodenum, not including                            ileum; diagnostic, including collection of                            specimen(s) by brushing or washing, when performed                            (separate procedure)                           44799, Unlisted procedure, small intestine Diagnosis Code(s):        --- Professional ---                           K44.9, Diaphragmatic hernia without obstruction or                            gangrene  D64.9, Anemia,  unspecified CPT copyright 2022 American Medical Association. All rights reserved. The codes documented in this report are preliminary and upon coder review may  be revised to meet current compliance requirements. Eugenia Hess, MD 11/15/2023 1:12:23 PM This report has been signed electronically. Number of Addenda: 0

## 2023-11-15 NOTE — Plan of Care (Signed)
   Problem: Activity: Goal: Risk for activity intolerance will decrease Outcome: Progressing   Problem: Coping: Goal: Level of anxiety will decrease Outcome: Progressing   Problem: Safety: Goal: Ability to remain free from injury will improve Outcome: Progressing

## 2023-11-15 NOTE — Hospital Course (Addendum)
 73yow admitted for anemia   Consultants GI  Procedures/Events 4/24 EGD unremarkable

## 2023-11-15 NOTE — Anesthesia Postprocedure Evaluation (Signed)
 Anesthesia Post Note  Patient: Susan Norris  Procedure(s) Performed: ENTEROSCOPY     Patient location during evaluation: PACU Anesthesia Type: MAC Level of consciousness: awake and alert Pain management: pain level controlled Vital Signs Assessment: post-procedure vital signs reviewed and stable Respiratory status: spontaneous breathing, nonlabored ventilation, respiratory function stable and patient connected to nasal cannula oxygen Cardiovascular status: blood pressure returned to baseline and stable Postop Assessment: no apparent nausea or vomiting Anesthetic complications: no   No notable events documented.  Last Vitals:  Vitals:   11/15/23 1323 11/15/23 1330  BP:  (!) 127/53  Pulse: (!) 51 (!) 53  Resp: 17 17  Temp:    SpO2: 96% 93%    Last Pain:  Vitals:   11/15/23 1330  TempSrc:   PainSc: 0-No pain                 Lethaniel Rave

## 2023-11-15 NOTE — Transfer of Care (Signed)
 Immediate Anesthesia Transfer of Care Note  Patient: AARIAN CLEAVER  Procedure(s) Performed: ENTEROSCOPY  Patient Location: PACU  Anesthesia Type:MAC  Level of Consciousness: sedated  Airway & Oxygen Therapy: Patient Spontanous Breathing  Post-op Assessment: Report given to RN  Post vital signs: Reviewed and stable  Last Vitals:  Vitals Value Taken Time  BP 97/39 11/15/23 1303  Temp    Pulse 51 11/15/23 1305  Resp 14 11/15/23 1305  SpO2 100 % 11/15/23 1305  Vitals shown include unfiled device data.  Last Pain:  Vitals:   11/15/23 1148  TempSrc: Temporal  PainSc: 0-No pain      Patients Stated Pain Goal: 0 (11/13/23 1943)  Complications: No notable events documented.

## 2023-11-15 NOTE — Progress Notes (Signed)
  Progress Note   Patient: Susan Norris WGN:[id number] DOB: [birth year] DOA: 11/13/2023     1 DOS: the patient was seen and examined on 11/15/2023   Brief hospital course: 73yow admitted for anemia   Consultants GI  Procedures/Events 4/24 EGD unremarkable   Assessment and Plan: Symptomatic anemia Suspected GI bleed - Presented with progressive fatigue and recent increase use of NSAIDs and steroids - Found to have 4 point drop in hemoglobin in the last month - Hemodynamically stable, denies any melena, hematochezia or hematemesis - Concern for possible upper GI bleed, however EGD unremarkable - plan for colonoscopy tomorrow   Rhinovirus positive Supportive care  Osteoarthritis - Has been self-medicating with home prednisone  and NSAIDs - Start Voltaren  gel, PRN Norco for pain   Prediabetes - Last A1c 6.1% 1 month ago     Subjective:  Feels fine No bleeding  Physical Exam: Vitals:   11/15/23 1320 11/15/23 1323 11/15/23 1330 11/15/23 1458  BP: (!) 102/58  (!) 127/53 (!) 151/76  Pulse: (!) 53 (!) 51 (!) 53 (!) 54  Resp: 18 17 17 18   Temp:    98.7 F (37.1 C)  TempSrc:    Oral  SpO2: (!) 89% 96% 93% 97%  Weight:      Height:       Physical Exam Vitals reviewed.  Constitutional:      General: She is not in acute distress.    Appearance: She is not ill-appearing or toxic-appearing.  Cardiovascular:     Rate and Rhythm: Normal rate and regular rhythm.     Heart sounds: No murmur heard. Pulmonary:     Effort: Pulmonary effort is normal. No respiratory distress.     Breath sounds: No wheezing, rhonchi or rales.  Neurological:     Mental Status: She is alert.  Psychiatric:        Mood and Affect: Mood normal.        Behavior: Behavior normal.     Data Reviewed: BMP noted Hgb up to 8.3  Family Communication: daughter at bedside  Disposition: Status is: Inpatient Remains inpatient appropriate because: anemia     Time spent: 35  minutes  Author: Jerline Moon, MD 11/15/2023 8:01 PM  For on call review [email or web address].

## 2023-11-15 NOTE — Plan of Care (Signed)
  Problem: Health Behavior/Discharge Planning: Goal: Ability to manage health-related needs will improve Outcome: Progressing   Problem: Clinical Measurements: Goal: Ability to maintain clinical measurements within normal limits will improve Outcome: Progressing Goal: Will remain free from infection Outcome: Progressing Goal: Diagnostic test results will improve Outcome: Progressing Goal: Respiratory complications will improve Outcome: Progressing   Problem: Activity: Goal: Risk for activity intolerance will decrease Outcome: Progressing   Problem: Nutrition: Goal: Adequate nutrition will be maintained Outcome: Progressing   Problem: Coping: Goal: Level of anxiety will decrease Outcome: Progressing   Problem: Safety: Goal: Ability to remain free from injury will improve Outcome: Progressing   Problem: Skin Integrity: Goal: Risk for impaired skin integrity will decrease Outcome: Progressing

## 2023-11-15 NOTE — H&P (Signed)
 [facility] History and Physical   Primary Care Physician:  Colene Dauphin, MD   Reason for Procedure:  Anemia, concern for upper GI bleed  Plan:    Upper endoscopy with possible small bowel enteroscopy if no findings on EGD     HPI: Susan Norris is a 73 y.o. female undergoing upper endoscopy for concern of anemia and possible upper GI bleeding.  Patient resented to the emergency department for concern of cough and weakness.  Hemoglobin noted to be 7.2 down from a previous value of eleven 1 month ago.  States that she had an episode of emesis of brown-colored material 2 weeks ago.  Takes NSAIDs and prednisone .  Does have a history of Crohn's colitis but this has been in stable remission.  Last EGD and colonoscopy performed in 2021.  Discussed with patient proceeding with EGD.  Consideration of SBE if no findings on EGD.   Past Medical History:  Diagnosis Date   Allergic rhinitis    Allergy    Allergy-induced asthma    Anxiety    Arthritis    knees    Asthma    Blood transfusion without reported diagnosis    at 73yo   CAD (coronary artery disease)    Cataract    removed bilateral eyes   COVID 12/21/2020   Crohn's colitis (HCC)    Depression    Fatty liver    GERD (gastroesophageal reflux disease)    History of chicken pox    History of shingles    HLD (hyperlipidemia)    Hyperlipidemia    Neoplasm of tongue    Osteopenia    Prediabetes    Rectocele    Sensorineural hearing loss    Vertigo    Vitamin D  deficiency     Past Surgical History:  Procedure Laterality Date   ABDOMINAL HYSTERECTOMY     total   CESAREAN SECTION     COLONOSCOPY  10/2015   completed in [facility]    OOPHORECTOMY Left    TONSILLECTOMY AND ADENOIDECTOMY     UPPER GASTROINTESTINAL ENDOSCOPY      Prior to Admission medications   Medication Sig Start Date End Date Taking? Authorizing Provider  albuterol  (VENTOLIN  HFA) 108 (90 Base) MCG/ACT inhaler Inhale 2 puffs into the lungs every 2  (two) hours as needed for wheezing or shortness of breath (cough). 12/21/20  Yes Muthersbaugh, Alisa App, PA-C  ascorbic acid (VITAMIN C) 500 MG tablet Take 1,000 mg by mouth daily.   Yes [provider]  aspirin 81 MG EC tablet Take 81 mg by mouth daily. Swallow whole.   Yes [provider]  Calcium  Carbonate-Vit D-Min (CALCIUM  1200 PO) Take 1,200 mg by mouth daily.   Yes [provider]  cetirizine (ZYRTEC) 10 MG tablet Take 10 mg by mouth daily. 11/03/23  Yes [provider]  Cholecalciferol (VITAMIN D3 PO) Take 250 mcg by mouth daily. 04/16/17  Yes [provider]  Coenzyme Q10 (COQ-10) 100 MG CAPS Take 100 mg by mouth daily.   Yes [provider]  Cranberry-Vitamin C (AZO CRANBERRY URINARY TRACT PO) Take 1 tablet by mouth daily.   Yes [provider]  Cyanocobalamin (B-12 PO) Take 500 mcg by mouth daily.   Yes [provider]  escitalopram  (LEXAPRO ) 20 MG tablet Take 1 tablet (20 mg total) by mouth daily. 10/08/23  Yes Burns, Beckey Bourgeois, MD  estradiol  (ESTRACE ) 1 MG tablet Take 1 tablet (1 mg total) by mouth daily. 10/08/23  Yes Colene Dauphin, MD  fluticasone  (FLONASE ) 50 MCG/ACT nasal spray Place 2 sprays into both nostrils daily. Patient taking differently: Place 2 sprays into both nostrils daily as needed for allergies. 12/21/20  Yes Muthersbaugh, Alisa App, PA-C  magnesium gluconate (MAGONATE) 500 MG tablet Take 500 mg by mouth daily.   Yes [provider]  meclizine  (ANTIVERT ) 25 MG tablet Take 1 tablet (25 mg total) by mouth 3 (three) times daily as needed for dizziness. 10/11/19  Yes Baity, Rankin Buzzard, NP  Omega-3 Fatty Acids (FISH OIL PO) Take 1,200 mg by mouth daily.   Yes [provider]  omeprazole  (PRILOSEC) 40 MG capsule TAKE 1 CAPSULE (40 MG TOTAL) BY MOUTH DAILY. 11/01/23  Yes Burns, Beckey Bourgeois, MD  rosuvastatin  (CRESTOR ) 10 MG tablet TAKE 1 TABLET BY MOUTH EVERY DAY Patient taking differently: Take 10 mg by  mouth at bedtime. 10/09/23  Yes Burns, Beckey Bourgeois, MD  Turmeric (QC TUMERIC COMPLEX PO) Take 2,400 mg by mouth daily.   Yes [provider]  vitamin E 180 MG (400 UNITS) capsule Take 400 Units by mouth daily.   Yes [provider]  zinc gluconate 50 MG tablet Take 50 mg by mouth daily.   Yes [provider]    Current Facility-Administered Medications  Medication Dose Route Frequency Provider Last Rate Last Admin   0.9 %  sodium chloride  infusion   Intravenous Continuous Graciella Lavender, [place]       [facility] Hold] acetaminophen  (TYLENOL ) tablet 650 mg  650 mg Oral Q6H PRN Amponsah, Prosper M, MD       Or   [facility] Hold] acetaminophen  (TYLENOL ) suppository 650 mg  650 mg Rectal Q6H PRN Amponsah, Prosper M, MD       [MAR Hold] albuterol  (PROVENTIL ) (2.5 MG/3ML) 0.083% nebulizer solution 3 mL  3 mL Inhalation Q2H PRN Vita Grip, MD       [facility] Hold] benzonatate  (TESSALON ) capsule 200 mg  200 mg Oral TID PRN Lonita Roach, MD   200 mg at 11/15/23 0914   [MAR Hold] diclofenac  Sodium (VOLTAREN ) 1 % topical gel 2 g  2 g Topical QID Vita Grip, MD       [facility] Hold] escitalopram  (LEXAPRO ) tablet 20 mg  20 mg Oral Daily Amponsah, Prosper M, MD   20 mg at 11/15/23 0913   [MAR Hold] fluticasone  (FLONASE ) 50 MCG/ACT nasal spray 2 spray  2 spray Each Nare Daily Vita Grip, MD   2 spray at 11/15/23 0914   [MAR Hold] HYDROcodone -acetaminophen  (NORCO/VICODIN) 5-325 MG per tablet 1 tablet  1 tablet Oral Q6H PRN Vita Grip, MD       [MAR Hold] loratadine  (CLARITIN ) tablet 10 mg  10 mg Oral Daily PRN Amponsah, Prosper M, MD       [facility] Hold] pantoprazole  (PROTONIX ) injection 40 mg  40 mg Intravenous BID Hall, Carole N, DO   40 mg at 11/15/23 0914   [MAR Hold] prochlorperazine  (COMPAZINE ) injection 5 mg  5 mg Intravenous Q6H PRN Bary Boss, DO       [MAR Hold] rosuvastatin  (CRESTOR ) tablet 10 mg  10 mg Oral Daily Amponsah, Prosper M, MD   10 mg at  11/15/23 0913   [MAR Hold] senna-docusate (Senokot-S) tablet 1 tablet  1 tablet Oral QHS PRN Vita Grip, MD        Allergies as of 11/13/2023 - Review Complete 11/13/2023  Allergen Reaction Noted   Codeine Itching 04/26/2017  Family History  Problem Relation Age of Onset   Diabetes Mother    Atrial fibrillation Mother    Thyroid  disease Mother    Arthritis Father    Asthma Father    Heart disease Father    Hyperlipidemia Father    Hypertension Father    Kidney disease Father    Diabetes Brother    Hypertension Brother    Depression Maternal Grandmother    Heart disease Maternal Grandfather    Hypertension Paternal Grandfather    Stroke Paternal Grandfather    Breast cancer Neg Hx    Colon cancer Neg Hx    Esophageal cancer Neg Hx    Rectal cancer Neg Hx    Stomach cancer Neg Hx     Social History   Socioeconomic History   Marital status: Married    Spouse name: Not on file   Number of children: Not on file   Years of education: Not on file   Highest education level: Not on file  Occupational History   Not on file  Tobacco Use   Smoking status: Never   Smokeless tobacco: Never  Vaping Use   Vaping status: Never Used  Substance and Sexual Activity   Alcohol use: No   Drug use: No   Sexual activity: Yes  Other Topics Concern   Not on file  Social History Narrative   Not on file   Social Drivers of Health   Financial Resource Strain: Low Risk  (06/02/2022)   Overall Financial Resource Strain (CARDIA)    Difficulty of Paying Living Expenses: Not hard at all  Food Insecurity: No Food Insecurity (11/14/2023)   Hunger Vital Sign    Worried About Running Out of Food in the Last Year: Never true    Ran Out of Food in the Last Year: Never true  Transportation Needs: No Transportation Needs (11/14/2023)   PRAPARE - Administrator, Civil Service (Medical): No    Lack of Transportation (Non-Medical): No  Physical Activity: Inactive  (06/02/2022)   Exercise Vital Sign    Days of Exercise per Week: 0 days    Minutes of Exercise per Session: 0 min  Stress: No Stress Concern Present (06/02/2022)   [employer] of Occupational Health - Occupational Stress Questionnaire    Feeling of Stress : Not at all  Social Connections: Moderately Integrated (11/14/2023)   Social Connection and Isolation Panel [NHANES]    Frequency of Communication with Friends and Family: More than three times a week    Frequency of Social Gatherings with Friends and Family: More than three times a week    Attends Religious Services: 1 to 4 times per year    Active Member of [employer] or Organizations: No    Attends Banker Meetings: Never    Marital Status: Married  Catering manager Violence: Not At Risk (11/14/2023)   Humiliation, Afraid, Rape, and Kick questionnaire    Fear of Current or Ex-Partner: No    Emotionally Abused: No    Physically Abused: No    Sexually Abused: No    Review of Systems:  All other review of systems negative except as mentioned in the HPI.  Physical Exam: Vital signs BP (!) 186/72   Pulse (!) 56   Temp (!) 97.5 F (36.4 C) (Temporal)   Resp 17   Ht 5\' 4"  (1.626 m)   Wt 84.3 kg   SpO2 97%   BMI 31.90 kg/m   General:  Alert,  Well-developed, well-nourished, pleasant and cooperative in NAD Lungs: Coarse Breath sounds bilaterally Heart:  Regular rate and rhythm; no murmurs, clicks, rubs,  or gallops. Abdomen:  Soft, nontender and nondistended. Normal bowel sounds.   Neuro/Psych:  Normal mood and affect. A and O x 3  Eugenia Hess, MD [facility]

## 2023-11-16 ENCOUNTER — Encounter (HOSPITAL_COMMUNITY): Payer: Self-pay | Admitting: Student

## 2023-11-16 ENCOUNTER — Inpatient Hospital Stay (HOSPITAL_COMMUNITY): Admitting: Anesthesiology

## 2023-11-16 ENCOUNTER — Encounter (HOSPITAL_COMMUNITY)
Admission: EM | Disposition: A | Discharge status: Home / Self Care | Payer: Self-pay | Source: Home / Self Care | Attending: Family Medicine

## 2023-11-16 DIAGNOSIS — K648 Other hemorrhoids: Secondary | ICD-10-CM | POA: Diagnosis not present

## 2023-11-16 DIAGNOSIS — K449 Diaphragmatic hernia without obstruction or gangrene: Secondary | ICD-10-CM

## 2023-11-16 DIAGNOSIS — D649 Anemia, unspecified: Secondary | ICD-10-CM

## 2023-11-16 DIAGNOSIS — D509 Iron deficiency anemia, unspecified: Secondary | ICD-10-CM | POA: Diagnosis not present

## 2023-11-16 DIAGNOSIS — M1990 Unspecified osteoarthritis, unspecified site: Secondary | ICD-10-CM | POA: Diagnosis not present

## 2023-11-16 DIAGNOSIS — K5520 Angiodysplasia of colon without hemorrhage: Secondary | ICD-10-CM

## 2023-11-16 DIAGNOSIS — K922 Gastrointestinal hemorrhage, unspecified: Secondary | ICD-10-CM | POA: Diagnosis not present

## 2023-11-16 HISTORY — PX: COLONOSCOPY: SHX5424

## 2023-11-16 LAB — CBC
HCT: 30.8 % — ABNORMAL LOW (ref 36.0–46.0)
Hemoglobin: 9.4 g/dL — ABNORMAL LOW (ref 12.0–15.0)
MCH: 26.9 pg (ref 26.0–34.0)
MCHC: 30.5 g/dL (ref 30.0–36.0)
MCV: 88.3 fL (ref 80.0–100.0)
Platelets: 371 10*3/uL (ref 150–400)
RBC: 3.49 MIL/uL — ABNORMAL LOW (ref 3.87–5.11)
RDW: 14.6 % (ref 11.5–15.5)
WBC: 11.5 10*3/uL — ABNORMAL HIGH (ref 4.0–10.5)
nRBC: 0 % (ref 0.0–0.2)

## 2023-11-16 SURGERY — COLONOSCOPY
Anesthesia: Monitor Anesthesia Care

## 2023-11-16 MED ORDER — LIDOCAINE HCL (CARDIAC) PF 100 MG/5ML IV SOSY
PREFILLED_SYRINGE | INTRAVENOUS | Status: DC | PRN
  Administered 2023-11-16: 50 mg via INTRAVENOUS

## 2023-11-16 MED ORDER — PROPOFOL 500 MG/50ML IV EMUL
INTRAVENOUS | Status: DC | PRN
  Administered 2023-11-16: 150 ug/kg/min via INTRAVENOUS

## 2023-11-16 MED ORDER — ONDANSETRON HCL 4 MG/2ML IJ SOLN
INTRAMUSCULAR | Status: DC | PRN
  Administered 2023-11-16: 4 mg via INTRAVENOUS

## 2023-11-16 MED ORDER — PROPOFOL 10 MG/ML IV BOLUS
INTRAVENOUS | Status: DC | PRN
  Administered 2023-11-16: 50 mg via INTRAVENOUS

## 2023-11-16 NOTE — Discharge Summary (Signed)
 Physician Discharge Summary   Patient: Susan Norris MRN: [id number] DOB: February 07, 1951  Admit date:     11/13/2023  Discharge date: 11/16/23  Discharge Physician: Jerline Moon   PCP: Colene Dauphin, MD   Recommendations at discharge:   Symptomatic anemia Suspected GI bleed - Colonoscopy as above, angioectasias may have been source.  If has further bleeding, proceed with capsule endoscopy   Discharge Diagnoses: Principal Problem:   Gastrointestinal hemorrhage Active Problems:   Symptomatic anemia   Acute cough   AVM (arteriovenous malformation) of colon  Resolved Problems:   * No resolved hospital problems. [facility] Course: 73yow admitted for acute anemia in the context of NSAID use.  Concern for GI bleed.  Seen by GI, underwent upper endoscopy which was unremarkable.  Colonoscopy revealed 2 nonbleeding colonic angioectasias which were treated with APC.  Hemoglobin stabilized status post transfusion.  Discharged home in good condition.  Aspirin held, stop NSAIDs.  Follow-up with PCP in 1 week to check CBC.  Consultants GI  Procedures/Events 4/24 EGD unremarkable 4/25 colonoscopy 2 nonbleeding colonic angiectasias treated with APC.  Symptomatic anemia Suspected GI bleed - Presented with progressive fatigue and recent increase use of NSAIDs and steroids - Found to have 4 point drop in hemoglobin in the last month - Hemodynamically stable, denied any melena, hematochezia or hematemesis - Concern for possible upper GI bleed, however EGD unremarkable - Colonoscopy as above, angioectasias may have been source.  If has further bleeding, proceed with capsule endoscopy   Rhinovirus positive Supportive care   Osteoarthritis - Has been self-medicating with home prednisone  and NSAIDs - Stop NSAIDs   Prediabetes - Last A1c 6.1% 1 month ago   Disposition: Home Diet recommendation:  Regular diet DISCHARGE MEDICATION: Allergies as of 11/16/2023   No Active Allergies       Medication List     PAUSE taking these medications    aspirin EC 81 MG tablet Wait to take this until your doctor or other care provider tells you to start again. Take 81 mg by mouth daily. Swallow whole.       STOP taking these medications    magnesium gluconate 500 MG tablet Commonly known as: MAGONATE       TAKE these medications    albuterol  108 (90 Base) MCG/ACT inhaler Commonly known as: VENTOLIN  HFA Inhale 2 puffs into the lungs every 2 (two) hours as needed for wheezing or shortness of breath (cough).   ascorbic acid 500 MG tablet Commonly known as: VITAMIN C Take 1,000 mg by mouth daily.   AZO CRANBERRY URINARY TRACT PO Take 1 tablet by mouth daily.   B-12 PO Take 500 mcg by mouth daily.   CALCIUM  1200 PO Take 1,200 mg by mouth daily.   cetirizine 10 MG tablet Commonly known as: ZYRTEC Take 10 mg by mouth daily.   CoQ-10 100 MG Caps Take 100 mg by mouth daily.   escitalopram  20 MG tablet Commonly known as: Lexapro  Take 1 tablet (20 mg total) by mouth daily.   estradiol  1 MG tablet Commonly known as: ESTRACE  Take 1 tablet (1 mg total) by mouth daily.   FISH OIL PO Take 1,200 mg by mouth daily.   fluticasone  50 MCG/ACT nasal spray Commonly known as: FLONASE  Place 2 sprays into both nostrils daily. What changed:  when to take this reasons to take this   meclizine  25 MG tablet Commonly known as: ANTIVERT  Take 1 tablet (25 mg total) by mouth 3 (three)  times daily as needed for dizziness.   omeprazole  40 MG capsule Commonly known as: PRILOSEC TAKE 1 CAPSULE (40 MG TOTAL) BY MOUTH DAILY.   QC TUMERIC COMPLEX PO Take 2,400 mg by mouth daily.   rosuvastatin  10 MG tablet Commonly known as: CRESTOR  TAKE 1 TABLET BY MOUTH EVERY DAY What changed: when to take this   VITAMIN D3 PO Take 250 mcg by mouth daily.   vitamin E 180 MG (400 UNITS) capsule Take 400 Units by mouth daily.   zinc gluconate 50 MG tablet Take 50 mg by mouth  daily.        Follow-up Information     Colene Dauphin, MD. Schedule an appointment as soon as possible for a visit in 1 week(s).   Specialty: Internal Medicine Contact information: [address] [place] [address] [phone]                Discharge Exam: Cleavon Curls Weights   11/13/23 2346 11/16/23 1312  Weight: 84.3 kg 83.9 kg   Physical Exam Vitals reviewed.  Constitutional:      General: She is not in acute distress.    Appearance: She is not ill-appearing or toxic-appearing.  Cardiovascular:     Rate and Rhythm: Normal rate and regular rhythm.     Heart sounds: No murmur heard. Pulmonary:     Effort: Pulmonary effort is normal. No respiratory distress.     Breath sounds: No wheezing, rhonchi or rales.  Neurological:     Mental Status: She is alert.  Psychiatric:        Mood and Affect: Mood normal.        Behavior: Behavior normal.      Condition at discharge: good  The results of significant diagnostics from this hospitalization (including imaging, microbiology, ancillary and laboratory) are listed below for reference.   Imaging Studies: CT ABDOMEN PELVIS W CONTRAST Result Date: 11/13/2023 CLINICAL DATA:  Lower GI bleed, progressive wheezing, cough, shortness of breath typical of seasonal bronchitis she gets every year. EXAM: CT ABDOMEN AND PELVIS WITH CONTRAST TECHNIQUE: Multidetector CT imaging of the abdomen and pelvis was performed using the standard protocol following bolus administration of intravenous contrast. RADIATION DOSE REDUCTION: This exam was performed according to the departmental dose-optimization program which includes automated exposure control, adjustment of the mA and/or kV according to patient size and/or use of iterative reconstruction technique. CONTRAST:  OMNIPAQUE  IOHEXOL  300 MG/ML  SOLN COMPARISON:  None Available. FINDINGS: Lower chest: No acute abnormality. Hepatobiliary: Unremarkable liver. Normal gallbladder. No  biliary dilation. Pancreas: Unremarkable. Spleen: Unremarkable. Adrenals/Urinary Tract: Normal adrenal glands. No urinary calculi or hydronephrosis. Bladder is unremarkable. Stomach/Bowel: Normal caliber large and small bowel. Colonic diverticulosis without diverticulitis. No bowel wall thickening. The appendix is not visualized. Partially visualized moderate hiatal hernia. Vascular/Lymphatic: No significant vascular findings are present. No enlarged abdominal or pelvic lymph nodes. Reproductive: Unremarkable. Other: No free intraperitoneal fluid or air. Musculoskeletal: No acute fracture. IMPRESSION: 1. No acute abnormality in the abdomen or pelvis. 2.  Colonic diverticulosis without diverticulitis. 3. Partially visualized moderate hiatal hernia. Electronically Signed   By: Rozell Cornet M.D.   On: 11/13/2023 21:00   DG Chest 2 View Result Date: 11/13/2023 CLINICAL DATA:  Cough. EXAM: CHEST - 2 VIEW COMPARISON:  None Available. FINDINGS: The heart size and mediastinal contours are within normal limits. Both lungs are clear. The visualized skeletal structures are unremarkable. IMPRESSION: No active cardiopulmonary disease. Electronically Signed   By: Collie Days  Jr M.D.   On: 11/13/2023 17:37    Microbiology: Results for orders placed or performed during the hospital encounter of 11/13/23  Respiratory (~20 pathogens) panel by PCR     Status: Abnormal   Collection Time: 11/14/23 12:50 PM   Specimen: Nasopharyngeal Swab; Respiratory  Result Value Ref Range Status   Adenovirus NOT DETECTED NOT DETECTED Final   Coronavirus 229E NOT DETECTED NOT DETECTED Final    Comment: (NOTE) The Coronavirus on the Respiratory Panel, DOES NOT test for the novel  Coronavirus (2019 nCoV)    Coronavirus HKU1 NOT DETECTED NOT DETECTED Final   Coronavirus NL63 NOT DETECTED NOT DETECTED Final   Coronavirus OC43 NOT DETECTED NOT DETECTED Final   Metapneumovirus NOT DETECTED NOT DETECTED Final   Rhinovirus /  Enterovirus DETECTED (A) NOT DETECTED Final   Influenza A NOT DETECTED NOT DETECTED Final   Influenza B NOT DETECTED NOT DETECTED Final   Parainfluenza Virus 1 NOT DETECTED NOT DETECTED Final   Parainfluenza Virus 2 NOT DETECTED NOT DETECTED Final   Parainfluenza Virus 3 NOT DETECTED NOT DETECTED Final   Parainfluenza Virus 4 NOT DETECTED NOT DETECTED Final   Respiratory Syncytial Virus NOT DETECTED NOT DETECTED Final   Bordetella pertussis NOT DETECTED NOT DETECTED Final   Bordetella Parapertussis NOT DETECTED NOT DETECTED Final   Chlamydophila pneumoniae NOT DETECTED NOT DETECTED Final   Mycoplasma pneumoniae NOT DETECTED NOT DETECTED Final    Comment: Performed at [facility], [address], [place], [place] [address]    Labs: CBC: Recent Labs  Lab 11/13/23 1638 11/13/23 2053 11/14/23 0720 11/15/23 0347 11/16/23 0346  WBC 9.5  --  8.7 9.5 11.5*  NEUTROABS 5.8  --   --   --   --   HGB 7.2* 7.2* 6.9* 8.3* 9.4*  HCT 23.6* 22.8* 22.7* 27.5* 30.8*  MCV 89.1  --  89.7 90.8 88.3  PLT 331  --  316 318 371   Basic Metabolic Panel: Recent Labs  Lab 11/13/23 1638 11/15/23 0347  NA 139 136  K 4.0 3.7  CL 101 103  CO2 26 25  GLUCOSE 148* 88  BUN 14 9  CREATININE 0.99 0.55  CALCIUM  9.0 8.4*   Liver Function Tests: Recent Labs  Lab 11/13/23 1638  AST 17  ALT 8  ALKPHOS 59  BILITOT <0.2  PROT 6.2*  ALBUMIN 3.9   CBG: No results for input(s): "GLUCAP" in the last 168 hours.  Discharge time spent: less than 30 minutes.  Signed: Jerline Moon, MD [facility] 11/16/2023

## 2023-11-16 NOTE — Plan of Care (Signed)
  Problem: Health Behavior/Discharge Planning: Goal: Ability to manage health-related needs will improve Outcome: Progressing   Problem: Clinical Measurements: Goal: Ability to maintain clinical measurements within normal limits will improve Outcome: Progressing Goal: Will remain free from infection Outcome: Progressing Goal: Diagnostic test results will improve Outcome: Progressing Goal: Respiratory complications will improve Outcome: Progressing   Problem: Activity: Goal: Risk for activity intolerance will decrease Outcome: Progressing   Problem: Nutrition: Goal: Adequate nutrition will be maintained Outcome: Progressing   Problem: Coping: Goal: Level of anxiety will decrease Outcome: Progressing   Problem: Safety: Goal: Ability to remain free from injury will improve Outcome: Progressing   Problem: Skin Integrity: Goal: Risk for impaired skin integrity will decrease Outcome: Progressing

## 2023-11-16 NOTE — Transfer of Care (Signed)
 Immediate Anesthesia Transfer of Care Note  Patient: Susan Norris  Procedure(s) Performed: Procedure(s): COLONOSCOPY (N/A)  Patient Location: PACU  Anesthesia Type:MAC  Level of Consciousness:  sedated, patient cooperative and responds to stimulation  Airway & Oxygen Therapy:Patient Spontanous Breathing and Patient connected to face mask oxgen  Post-op Assessment:  Report given to PACU RN and Post -op Vital signs reviewed and stable  Post vital signs:  Reviewed and stable  Last Vitals:  Vitals:   11/16/23 1312 11/16/23 1420  BP: (!) 171/73 (!) 126/113  Pulse: 60 (!) 56  Resp: 16 14  Temp: 36.8 C   SpO2: 97% 100%    Complications: No apparent anesthesia complications

## 2023-11-16 NOTE — Op Note (Signed)
 [facility] Patient Name: Susan Norris Procedure Date: 11/16/2023 MRN: [id number] Attending MD: Eugenia Hess , MD, [id number] Date of Birth: 06/24/1951 CSN: [id number] Age: 73 Admit Type: Outpatient Procedure:                Colonoscopy Indications:              Anemia, history of Crohn's colitis, no overt GI                            bleeding, last colonoscopy performed in 2021                            without active inflammation -tubular adenomas                            present Providers:                Eugenia Hess, MD, Golda Latch, RN, Rinda Cheers,                            Technician Referring MD:              Medicines:                Monitored Anesthesia Care Complications:            No immediate complications. Estimated blood loss:                            Minimal. Estimated Blood Loss:     Estimated blood loss was minimal. Procedure:                Pre-Anesthesia Assessment:                           - Prior to the procedure, a History and Physical                            was performed, and patient medications and                            allergies were reviewed. The patient's tolerance of                            previous anesthesia was also reviewed. The risks                            and benefits of the procedure and the sedation                            options and risks were discussed with the patient.                            All questions were answered, and informed consent                            was obtained. Prior Anticoagulants: The patient has  taken no anticoagulant or antiplatelet agents                            except for aspirin. ASA Grade Assessment: III - A                            patient with severe systemic disease. After                            reviewing the risks and benefits, the patient was                            deemed in satisfactory condition to undergo the                             procedure.                           After obtaining informed consent, the colonoscope                            was passed under direct vision. Throughout the                            procedure, the patient's blood pressure, pulse, and                            oxygen saturations were monitored continuously. The                            PCF-HQ190L ([id number] Olympus colonoscope was                            introduced through the anus and advanced to the                            terminal ileum. The colonoscopy was performed                            without difficulty. The patient tolerated the                            procedure well. The quality of the bowel                            preparation was adequate to identify polyps greater                            than 5 mm in size. Anatomical landmarks were                            photographed. The terminal ileum, ileocecal valve,  appendiceal orifice, and rectum were photographed. Scope In: 1:53:03 PM Scope Out: 2:14:24 PM Scope Withdrawal Time: 0 hours 16 minutes 41 seconds  Total Procedure Duration: 0 hours 21 minutes 21 seconds  Findings:      The perianal and digital rectal examinations were normal. Pertinent       negatives include normal sphincter tone and no palpable rectal lesions.      A moderate amount of semi-liquid stool was found in the entire colon.       Lavage of the area was performed using a large amount of sterile water,       resulting in clearance with adequate visualization.      Two small localized angioectasias without bleeding were found in the       cecum. Coagulation for tumor destruction using argon plasma at 0.5       liters/minute and 20 watts was successful. To prevent bleeding       post-intervention, one hemostatic clip was successfully placed over one       of the angioectasias. There was no bleeding at the end of the procedure.      Normal  mucosa was found in the entire colon.      The terminal ileum appeared normal.      Internal hemorrhoids were found during retroflexion. Impression:               - Stool in the entire examined colon.                           - Two non-bleeding colonic angioectasias. Treated                            with argon plasma coagulation (APC). Clip was                            placed. It is possible that the angioectasias could                            cause anemia from intermittent microscopic bleeding.                           - Normal mucosa in the entire examined colon.                           - The examined portion of the ileum was normal.                           - Internal hemorrhoids.                           - No specimens collected. Moderate Sedation:      Not Applicable - Patient had care per Anesthesia. Recommendation:           - Return patient to hospital ward for ongoing care                           - May resume regular diet                           -  If patient is clinically stable can consider                            discharge today or tomorrow                           - Recommend monitoring patient's hemoglobin in the                            outpatient setting. 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 If anemia recurs,                            however, then would consider outpatient video                            capsule. 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 All rights reserved. The codes documented in this report are preliminary and upon coder review may  be revised to meet current compliance requirements. Eugenia Hess, MD 11/16/2023 2:28:06 PM This report has been signed electronically. Number of Addenda: 0

## 2023-11-16 NOTE — Progress Notes (Signed)
    Progress Note   Subjective  Hospital day #3 Chief Complaint: Iron deficiency anemia  Status post EGD and push enteroscopy 4/24 with no etiology for anemia  Today, patient is ready for colonoscopy scheduled later today around 145.  Denies any acute complaints.  Does tell me that she is having mostly clear with a yellowish tinge stool after the Suprep overnight.   Objective   Vital signs in last 24 hours: Temp:  [89 F (31.7 C)-98.7 F (37.1 C)] 98.1 F (36.7 C) (04/25 0517) Pulse Rate:  [50-66] 66 (04/25 0517) Resp:  [13-18] 18 (04/25 0517) BP: (97-186)/(39-77) 146/77 (04/25 0517) SpO2:  [89 %-100 %] 94 % (04/25 0517) Last BM Date : (P) 11/15/23 General: Elderly white female in NAD Heart:  Regular rate and rhythm; no murmurs Lungs: Respirations even and unlabored, lungs CTA bilaterally Abdomen:  Soft, nontender and nondistended. Normal bowel sounds. Psych:  Cooperative. Normal mood and affect.  Intake/Output from previous day: 04/24 0701 - 04/25 0700 In: 1592 [P.O.:1440; I.V.:152] Out: -   Lab Results: Recent Labs    11/14/23 0720 11/15/23 0347 11/16/23 0346  WBC 8.7 9.5 11.5*  HGB 6.9* 8.3* 9.4*  HCT 22.7* 27.5* 30.8*  PLT 316 318 371   BMET Recent Labs    11/13/23 1638 11/15/23 0347  NA 139 136  K 4.0 3.7  CL 101 103  CO2 26 25  GLUCOSE 148* 88  BUN 14 9  CREATININE 0.99 0.55  CALCIUM  9.0 8.4*   LFT Recent Labs    11/13/23 1638  PROT 6.2*  ALBUMIN 3.9  AST 17  ALT 8  ALKPHOS 59  BILITOT <0.2    Assessment / Plan:   Assessment: 1.  Symptomatic iron deficiency anemia: Presented with progressive fatigue over the last couple of weeks and history of NSAID/steroid use found to have a 4 point drop in hemoglobin over the past month, given 1 unit PRBCs on admission and started on IV PPI, history of 1 episode of hematemesis with black material as well as 1 episode of melena after taking Pepto-Bismol about 2 weeks ago, none since, last colonoscopy in  2021 with tubular adenomas and otherwise normal, EGD at the same time of gastritis, repeat EGD and push enteroscopy yesterday was normal, hemoglobin stable overnight; consider lower GI source for small bowel etiology 2.  Osteoarthritis: Had been self-medicating with NSAIDs of prednisone  at home  Plan: 1.  Plan for colonoscopy today.  Patient will be n.p.o. until after time procedure.  Please await further recommendations then.  Thank you for your kind consultation, patient does wonder if she will be able to go home after after procedure.  This will depend on results.   LOS: 2 days   Susan Norris  11/16/2023, 9:25 AM

## 2023-11-16 NOTE — Anesthesia Postprocedure Evaluation (Signed)
 Anesthesia Post Note  Patient: BABETTE STUM  Procedure(s) Performed: COLONOSCOPY     Patient location during evaluation: Endoscopy Anesthesia Type: MAC Level of consciousness: awake and alert Pain management: pain level controlled Vital Signs Assessment: post-procedure vital signs reviewed and stable Respiratory status: spontaneous breathing, nonlabored ventilation, respiratory function stable and patient connected to nasal cannula oxygen Cardiovascular status: blood pressure returned to baseline and stable Postop Assessment: no apparent nausea or vomiting Anesthetic complications: no  No notable events documented.  Last Vitals:  Vitals:   11/16/23 1312 11/16/23 1420  BP: (!) 171/73 (!) 126/113  Pulse: 60 (!) 56  Resp: 16 14  Temp: 36.8 C   SpO2: 97% 100%    Last Pain:  Vitals:   11/16/23 1312  TempSrc: Temporal  PainSc: 0-No pain                 Rosalita Combe

## 2023-11-16 NOTE — Anesthesia Preprocedure Evaluation (Addendum)
 Anesthesia Evaluation  Patient identified by MRN, date of birth, ID band Patient awake    Reviewed: Allergy & Precautions, NPO status , Patient's Chart, lab work & pertinent test results  History of Anesthesia Complications Negative for: history of anesthetic complications  Airway Mallampati: II  TM Distance: >3 FB Neck ROM: Full    Dental  (+) Dental Advisory Given, Teeth Intact   Pulmonary asthma    Pulmonary exam normal        Cardiovascular + CAD  Normal cardiovascular exam     Neuro/Psych  PSYCHIATRIC DISORDERS Anxiety Depression     Vertigo     GI/Hepatic Neg liver ROS, hiatal hernia,GERD  Medicated and Controlled,, GIB    Endo/Other   Obesity Pre-DM   Renal/GU negative Renal ROS     Musculoskeletal  (+) Arthritis ,    Abdominal   Peds  Hematology  (+) Blood dyscrasia, anemia   Anesthesia Other Findings   Reproductive/Obstetrics                              Anesthesia Physical Anesthesia Plan  ASA: 3  Anesthesia Plan: MAC   Post-op Pain Management: Minimal or no pain anticipated   Induction:   PONV Risk Score and Plan: 2 and Propofol  infusion and Treatment may vary due to age or medical condition  Airway Management Planned: Nasal Cannula and Natural Airway  Additional Equipment: None  Intra-op Plan:   Post-operative Plan:   Informed Consent: I have reviewed the patients History and Physical, chart, labs and discussed the procedure including the risks, benefits and alternatives for the proposed anesthesia with the patient or authorized representative who has indicated his/her understanding and acceptance.       Plan Discussed with: CRNA and Anesthesiologist  Anesthesia Plan Comments:          Anesthesia Quick Evaluation

## 2023-11-16 NOTE — H&P (Signed)
 [facility] History and Physical   Primary Care Physician:  Colene Dauphin, MD   Reason for Procedure:  Anemia, history of Crohn's disease  Plan:    Colonoscopy     HPI: Susan Norris is a 73 y.o. female undergoing colonoscopy for investigation of anemia in the setting of Crohn's disease.  Patient was admitted to the hospital with weakness and cough and incidentally found to have hemoglobin 6.9.  Denied overt GI bleeding.  EGD unremarkable.  Crohn's disease has been in Onikul/symptomatic remission.  She is not currently on any treatment.  Last colonoscopy was performed in thousand 21 and revealed polyps but no evidence of active inflammation.   Past Medical History:  Diagnosis Date   Allergic rhinitis    Allergy    Allergy-induced asthma    Anxiety    Arthritis    knees    Asthma    Blood transfusion without reported diagnosis    at 73yo   CAD (coronary artery disease)    Cataract    removed bilateral eyes   COVID 12/21/2020   Crohn's colitis (HCC)    Depression    Fatty liver    GERD (gastroesophageal reflux disease)    History of chicken pox    History of shingles    HLD (hyperlipidemia)    Hyperlipidemia    Neoplasm of tongue    Osteopenia    Prediabetes    Rectocele    Sensorineural hearing loss    Vertigo    Vitamin D  deficiency     Past Surgical History:  Procedure Laterality Date   ABDOMINAL HYSTERECTOMY     total   CESAREAN SECTION     COLONOSCOPY  10/2015   completed in [facility]    OOPHORECTOMY Left    TONSILLECTOMY AND ADENOIDECTOMY     UPPER GASTROINTESTINAL ENDOSCOPY      Prior to Admission medications   Medication Sig Start Date End Date Taking? Authorizing Provider  albuterol  (VENTOLIN  HFA) 108 (90 Base) MCG/ACT inhaler Inhale 2 puffs into the lungs every 2 (two) hours as needed for wheezing or shortness of breath (cough). 12/21/20  Yes Muthersbaugh, Alisa App, PA-C  ascorbic acid (VITAMIN C) 500 MG tablet Take 1,000 mg by mouth daily.    Yes [provider]  aspirin 81 MG EC tablet Take 81 mg by mouth daily. Swallow whole.   Yes [provider]  Calcium  Carbonate-Vit D-Min (CALCIUM  1200 PO) Take 1,200 mg by mouth daily.   Yes [provider]  cetirizine (ZYRTEC) 10 MG tablet Take 10 mg by mouth daily. 11/03/23  Yes [provider]  Cholecalciferol (VITAMIN D3 PO) Take 250 mcg by mouth daily. 04/16/17  Yes [provider]  Coenzyme Q10 (COQ-10) 100 MG CAPS Take 100 mg by mouth daily.   Yes [provider]  Cranberry-Vitamin C (AZO CRANBERRY URINARY TRACT PO) Take 1 tablet by mouth daily.   Yes [provider]  Cyanocobalamin (B-12 PO) Take 500 mcg by mouth daily.   Yes [provider]  escitalopram  (LEXAPRO ) 20 MG tablet Take 1 tablet (20 mg total) by mouth daily. 10/08/23  Yes Burns, Beckey Bourgeois, MD  estradiol  (ESTRACE ) 1 MG tablet Take 1 tablet (1 mg total) by mouth daily. 10/08/23  Yes Burns, Beckey Bourgeois, MD  fluticasone  (FLONASE ) 50 MCG/ACT nasal spray Place 2 sprays into both nostrils daily. Patient taking differently: Place 2 sprays into both nostrils daily as needed for allergies. 12/21/20  Yes Muthersbaugh, Alisa App, PA-C  magnesium gluconate (MAGONATE) 500 MG tablet Take 500 mg by mouth daily.   Yes [provider]  meclizine  (ANTIVERT ) 25 MG tablet Take 1 tablet (25 mg total) by mouth 3 (three) times daily as needed for dizziness. 10/11/19  Yes Baity, Rankin Buzzard, NP  Omega-3 Fatty Acids (FISH OIL PO) Take 1,200 mg by mouth daily.   Yes [provider]  omeprazole  (PRILOSEC) 40 MG capsule TAKE 1 CAPSULE (40 MG TOTAL) BY MOUTH DAILY. 11/01/23  Yes Burns, Beckey Bourgeois, MD  rosuvastatin  (CRESTOR ) 10 MG tablet TAKE 1 TABLET BY MOUTH EVERY DAY Patient taking differently: Take 10 mg by mouth at bedtime. 10/09/23  Yes Burns, Beckey Bourgeois, MD  Turmeric (QC TUMERIC COMPLEX PO) Take 2,400 mg by mouth daily.   Yes [provider]  vitamin E 180 MG (400 UNITS)  capsule Take 400 Units by mouth daily.   Yes [provider]  zinc gluconate 50 MG tablet Take 50 mg by mouth daily.   Yes [provider]    Current Facility-Administered Medications  Medication Dose Route Frequency Provider Last Rate Last Admin   0.9 %  sodium chloride  infusion   Intravenous Continuous Graciella Lavender, [place] 20 mL/hr at 11/15/23 1625 New Bag at 11/15/23 1625   [MAR Hold] acetaminophen  (TYLENOL ) tablet 650 mg  650 mg Oral Q6H PRN Amponsah, Prosper M, MD       Or   Evette Hoes Hold] acetaminophen  (TYLENOL ) suppository 650 mg  650 mg Rectal Q6H PRN Amponsah, Prosper M, MD       [MAR Hold] albuterol  (PROVENTIL ) (2.5 MG/3ML) 0.083% nebulizer solution 3 mL  3 mL Inhalation Q2H PRN Vita Grip, MD       [facility] Hold] benzonatate  (TESSALON ) capsule 200 mg  200 mg Oral TID PRN Lonita Roach, MD   200 mg at 11/15/23 2137   [MAR Hold] diclofenac  Sodium (VOLTAREN ) 1 % topical gel 2 g  2 g Topical QID Vita Grip, MD       [facility] Hold] escitalopram  (LEXAPRO ) tablet 20 mg  20 mg Oral Daily Amponsah, Prosper M, MD   20 mg at 11/16/23 0933   [MAR Hold] fluticasone  (FLONASE ) 50 MCG/ACT nasal spray 2 spray  2 spray Each Nare Daily Vita Grip, MD   2 spray at 11/16/23 0932   [MAR Hold] HYDROcodone -acetaminophen  (NORCO/VICODIN) 5-325 MG per tablet 1 tablet  1 tablet Oral Q6H PRN Vita Grip, MD       [MAR Hold] loratadine  (CLARITIN ) tablet 10 mg  10 mg Oral Daily PRN Amponsah, Prosper M, MD       [facility] Hold] pantoprazole  (PROTONIX ) injection 40 mg  40 mg Intravenous BID Reesa Cannon N, DO   40 mg at 11/16/23 0932   [MAR Hold] prochlorperazine  (COMPAZINE ) injection 5 mg  5 mg Intravenous Q6H PRN Reesa Cannon N, DO   5 mg at 11/15/23 1730   [MAR Hold] rosuvastatin  (CRESTOR ) tablet 10 mg  10 mg Oral Daily Vita Grip, MD   10 mg at 11/16/23 0933   [MAR Hold] senna-docusate (Senokot-S) tablet 1 tablet  1 tablet Oral QHS PRN Vita Grip, MD         Allergies as of 11/13/2023 - Review Complete 11/13/2023  Allergen Reaction Noted   Codeine Itching 04/26/2017    Family History  Problem Relation Age of Onset   Diabetes Mother    Atrial fibrillation Mother    Thyroid  disease Mother    Arthritis Father  Asthma Father    Heart disease Father    Hyperlipidemia Father    Hypertension Father    Kidney disease Father    Diabetes Brother    Hypertension Brother    Depression Maternal Grandmother    Heart disease Maternal Grandfather    Hypertension Paternal Grandfather    Stroke Paternal Grandfather    Breast cancer Neg Hx    Colon cancer Neg Hx    Esophageal cancer Neg Hx    Rectal cancer Neg Hx    Stomach cancer Neg Hx     Social History   Socioeconomic History   Marital status: Married    Spouse name: Not on file   Number of children: Not on file   Years of education: Not on file   Highest education level: Not on file  Occupational History   Not on file  Tobacco Use   Smoking status: Never   Smokeless tobacco: Never  Vaping Use   Vaping status: Never Used  Substance and Sexual Activity   Alcohol use: No   Drug use: No   Sexual activity: Yes  Other Topics Concern   Not on file  Social History Narrative   Not on file   Social Drivers of Health   Financial Resource Strain: Low Risk  (06/02/2022)   Overall Financial Resource Strain (CARDIA)    Difficulty of Paying Living Expenses: Not hard at all  Food Insecurity: No Food Insecurity (11/14/2023)   Hunger Vital Sign    Worried About Running Out of Food in the Last Year: Never true    Ran Out of Food in the Last Year: Never true  Transportation Needs: No Transportation Needs (11/14/2023)   PRAPARE - Administrator, Civil Service (Medical): No    Lack of Transportation (Non-Medical): No  Physical Activity: Inactive (06/02/2022)   Exercise Vital Sign    Days of Exercise per Week: 0 days    Minutes of Exercise per Session: 0 min  Stress:  No Stress Concern Present (06/02/2022)   [employer] of Occupational Health - Occupational Stress Questionnaire    Feeling of Stress : Not at all  Social Connections: Moderately Integrated (11/14/2023)   Social Connection and Isolation Panel [NHANES]    Frequency of Communication with Friends and Family: More than three times a week    Frequency of Social Gatherings with Friends and Family: More than three times a week    Attends Religious Services: 1 to 4 times per year    Active Member of [employer] or Organizations: No    Attends Banker Meetings: Never    Marital Status: Married  Catering manager Violence: Not At Risk (11/14/2023)   Humiliation, Afraid, Rape, and Kick questionnaire    Fear of Current or Ex-Partner: No    Emotionally Abused: No    Physically Abused: No    Sexually Abused: No    Review of Systems:  All other review of systems negative except as mentioned in the HPI.  Physical Exam: Vital signs BP (!) 171/73   Pulse 60   Temp 98.3 F (36.8 C) (Temporal)   Resp 16   Ht 5\' 4"  (1.626 m)   Wt 83.9 kg   SpO2 97%   BMI 31.76 kg/m   General:   Alert,  Well-developed, well-nourished, pleasant and cooperative in NAD Lungs:  Clear throughout to auscultation.   Heart:  Regular rate and rhythm; no murmurs, clicks, rubs,  or gallops. Abdomen:  Soft, nontender  and nondistended. Normal bowel sounds.   Neuro/Psych:  Normal mood and affect. A and O x 3  Eugenia Hess, MD [facility]

## 2023-11-18 ENCOUNTER — Encounter (HOSPITAL_COMMUNITY): Payer: Self-pay | Admitting: Pediatrics

## 2023-11-19 ENCOUNTER — Other Ambulatory Visit: Payer: Self-pay

## 2023-11-19 ENCOUNTER — Encounter (HOSPITAL_COMMUNITY): Payer: Self-pay | Admitting: Pediatrics

## 2023-11-19 ENCOUNTER — Telehealth: Payer: Self-pay

## 2023-11-19 DIAGNOSIS — K922 Gastrointestinal hemorrhage, unspecified: Secondary | ICD-10-CM

## 2023-11-19 NOTE — Telephone Encounter (Signed)
 Pt made aware of Dr. Yvone Herd recommendations: Pt was notified of office visit that was scheudled on 12/13/2023 at 2:10. Order for lab placed in Epic.  Pt was notified of the lab that Dr. Yvone Herd is requesting around 11/30/2023. Location to lab provided. Pt verbalized understanding with all questions answered.

## 2023-11-19 NOTE — Telephone Encounter (Signed)
-----   Message from Pacheco H sent at 11/19/2023  7:13 AM EDT ----- Regarding: RE: Hospital discharge follow-up appointment Patient scheduled with Dr. Venice Gillis for 12/13/23 @ 2:10.  Attn Nurses, can you scheduled patients labs in 2 weeks please.   Thanks ----- Message ----- From: Truddie Furrow, MD Sent: 11/16/2023   6:14 PM EDT To: Fraser Jackson Subject: Hospital discharge follow-up appointment       Hi Christy -  Ms. Wrightson was discharged from [place] on 11/16/2023 and needs follow-up with our office.  She was previously seen by Dr. Savannah Curlin.  Please contact her and schedule her for a follow-up appointment with either me or one of the APP's in 4 to 6 weeks.  She will also need to be set up for a follow-up CBC in 2 weeks.  Please let me know if you have any questions or if there is another staff member I should be contacting for [employer].  Thanks,  Haskell Linker

## 2023-11-21 ENCOUNTER — Ambulatory Visit

## 2023-11-21 ENCOUNTER — Telehealth: Payer: Self-pay

## 2023-11-21 VITALS — Ht 64.0 in | Wt 185.0 lb

## 2023-11-21 DIAGNOSIS — Z0000 Encounter for general adult medical examination without abnormal findings: Secondary | ICD-10-CM

## 2023-11-21 NOTE — Patient Instructions (Signed)
 Ms. Wegrzyn , Thank you for taking time to come for your Medicare Wellness Visit. I appreciate your ongoing commitment to your health goals. Please review the following plan we discussed and let me know if I can assist you in the future.   Referrals/Orders/Follow-Ups/Clinician Recommendations: It was nice talking to you today.  Keep up the good work.   This is a list of the screening recommended for you and due dates:  Health Maintenance  Topic Date Due   COVID-19 Vaccine (4 - 2024-25 season) 03/25/2023   Medicare Annual Wellness Visit  06/03/2023   Flu Shot  02/22/2024   Mammogram  11/20/2024   DTaP/Tdap/Td vaccine (2 - Td or Tdap) 07/24/2025   DEXA scan (bone density measurement)  04/05/2026   Colon Cancer Screening  11/16/2030   Pneumonia Vaccine  Completed   Hepatitis C Screening  Completed   Zoster (Shingles) Vaccine  Completed   HPV Vaccine  Aged Out   Meningitis B Vaccine  Aged Out    Advanced directives: (Declined) Advance directive discussed with you today. Even though you declined this today, please call our office should you change your mind, and we can give you the proper paperwork for you to fill out.  Next Medicare Annual Wellness Visit scheduled for next year: Yes

## 2023-11-21 NOTE — Telephone Encounter (Signed)
-----   Message from Cora H sent at 11/21/2023  8:25 AM EDT ----- Regarding: FW: Hospital discharge follow-up Hi Nurses,  Can you please help with rescheduling this appointment?  Dr. Jarold Merlin next available that I have access to schedule for is 01/21/24.  Thanks ----- Message ----- From: Marline Simons, RN Sent: 11/20/2023   4:12 PM EDT To: Fraser Jackson; Truddie Furrow, MD Subject: RE: Hospital discharge follow-up               Christy, please see note below from Dr. Yvone Herd. Thanks ----- Message ----- From: Truddie Furrow, MD Sent: 11/20/2023   2:11 PM EDT To: [facility] Triage Subject: Hospital discharge follow-up                   Hi pod nurses -   I think I may have sent a message in error regarding hospital follow-up for Ms. Brayman.  She was previously seen by Dr. Savannah Curlin and I met her at [facility] last week for anemia.  I meant for her to have a follow-up appointment with me or an APP within the next month but see she was scheduled with Dr. Venice Gillis.  Since I consulted on her in the hospital she should follow-up with me.  Would you be able to move her to my schedule and one of the nurse slots?  Thanks,  Haskell Linker

## 2023-11-21 NOTE — Telephone Encounter (Signed)
 Called and spoke with patient regarding appointment. Patient states that she is unable to come on 12/13/23 anyway. Her appointment has been rescheduled to Wednesday, 12/19/23 at 11:10 am with Dr. Yvone Herd.

## 2023-11-21 NOTE — Progress Notes (Signed)
 Subjective:   Susan Norris is a 73 y.o. who presents for a Medicare Wellness preventive visit.  Visit Complete: Virtual I connected with  [name] on 11/21/23 by a video and audio enabled telemedicine application and verified that I am speaking with the correct person using two identifiers.  Patient Location: Home  Provider Location: Home Office  I discussed the limitations of evaluation and management by telemedicine. The patient expressed understanding and agreed to proceed.  Vital Signs: Because this visit was a virtual/telehealth visit, some criteria may be missing or patient reported. Any vitals not documented were not able to be obtained and vitals that have been documented are patient reported.   Persons Participating in Visit: Patient.  AWV Questionnaire: Yes: Patient Medicare AWV questionnaire was completed by the patient on 11/18/2023; I have confirmed that all information answered by patient is correct and no changes since this date.  Cardiac Risk Factors include: advanced age (>24men, >85 women);dyslipidemia;Other (see comment), Risk factor comments: Crohn disease, Osteopenia, CAD     Objective:    Today's Vitals   11/21/23 1112  Weight: 185 lb (83.9 kg)  Height: 5\' 4"  (1.626 m)   Body mass index is 31.76 kg/m.     11/21/2023   11:39 AM 11/16/2023    1:09 PM 11/14/2023   12:02 AM 11/13/2023    4:28 PM 06/02/2022   11:19 AM 05/12/2021    9:26 AM  Advanced Directives  Does Patient Have a Medical Advance Directive? No No No No No Yes  Type of Advance Directive      Living will;Healthcare Power of Attorney  Does patient want to make changes to medical advance directive?      No - Patient declined  Copy of Healthcare Power of Attorney in Chart?      No - copy requested  Would patient like information on creating a medical advance directive?   No - Patient declined No - Patient declined No - Patient declined     Current Medications (verified) Outpatient  Encounter Medications as of 11/21/2023  Medication Sig   albuterol  (VENTOLIN  HFA) 108 (90 Base) MCG/ACT inhaler Inhale 2 puffs into the lungs every 2 (two) hours as needed for wheezing or shortness of breath (cough).   ascorbic acid (VITAMIN C) 500 MG tablet Take 1,000 mg by mouth daily.   Calcium  Carbonate-Vit D-Min (CALCIUM  1200 PO) Take 1,200 mg by mouth daily.   cetirizine (ZYRTEC) 10 MG tablet Take 10 mg by mouth daily.   Cholecalciferol (VITAMIN D3 PO) Take 250 mcg by mouth daily.   Coenzyme Q10 (COQ-10) 100 MG CAPS Take 100 mg by mouth daily.   Cranberry-Vitamin C (AZO CRANBERRY URINARY TRACT PO) Take 1 tablet by mouth daily.   Cyanocobalamin (B-12 PO) Take 500 mcg by mouth daily.   escitalopram  (LEXAPRO ) 20 MG tablet Take 1 tablet (20 mg total) by mouth daily.   estradiol  (ESTRACE ) 1 MG tablet Take 1 tablet (1 mg total) by mouth daily.   fluticasone  (FLONASE ) 50 MCG/ACT nasal spray Place 2 sprays into both nostrils daily. (Patient taking differently: Place 2 sprays into both nostrils daily as needed for allergies.)   meclizine  (ANTIVERT ) 25 MG tablet Take 1 tablet (25 mg total) by mouth 3 (three) times daily as needed for dizziness.   Omega-3 Fatty Acids (FISH OIL PO) Take 1,200 mg by mouth daily.   omeprazole  (PRILOSEC) 40 MG capsule TAKE 1 CAPSULE (40 MG TOTAL) BY MOUTH DAILY.   rosuvastatin  (CRESTOR )  10 MG tablet TAKE 1 TABLET BY MOUTH EVERY DAY (Patient taking differently: Take 10 mg by mouth at bedtime.)   Turmeric (QC TUMERIC COMPLEX PO) Take 2,400 mg by mouth daily.   vitamin E 180 MG (400 UNITS) capsule Take 400 Units by mouth daily.   zinc gluconate 50 MG tablet Take 50 mg by mouth daily.   [Paused] aspirin 81 MG EC tablet Take 81 mg by mouth daily. Swallow whole.   No facility-administered encounter medications on file as of 11/21/2023.    Allergies (verified) Patient has no active allergies.   History: Past Medical History:  Diagnosis Date   Allergic rhinitis     Allergy    Allergy-induced asthma    Anxiety    Arthritis    knees    Asthma    Blood transfusion without reported diagnosis    at 73yo   CAD (coronary artery disease)    Cataract    removed bilateral eyes   COVID 12/21/2020   Crohn's colitis (HCC)    Depression    Fatty liver    GERD (gastroesophageal reflux disease)    History of chicken pox    History of shingles    HLD (hyperlipidemia)    Hyperlipidemia    Neoplasm of tongue    Osteopenia    Prediabetes    Rectocele    Sensorineural hearing loss    Vertigo    Vitamin D  deficiency    Past Surgical History:  Procedure Laterality Date   ABDOMINAL HYSTERECTOMY     total   CESAREAN SECTION     COLONOSCOPY  10/2015   completed in [facility]    COLONOSCOPY N/A 11/16/2023   Procedure: COLONOSCOPY;  Surgeon: Truddie Furrow, MD;  Location: [facility] ENDOSCOPY;  Service: Gastroenterology;  Laterality: N/A;   ENTEROSCOPY N/A 11/15/2023   Procedure: ENTEROSCOPY;  Surgeon: Truddie Furrow, MD;  Location: [facility] ENDOSCOPY;  Service: Gastroenterology;  Laterality: N/A;   OOPHORECTOMY Left    TONSILLECTOMY AND ADENOIDECTOMY     UPPER GASTROINTESTINAL ENDOSCOPY     Family History  Problem Relation Age of Onset   Diabetes Mother    Atrial fibrillation Mother    Thyroid  disease Mother    Arthritis Father    Asthma Father    Heart disease Father    Hyperlipidemia Father    Hypertension Father    Kidney disease Father    Diabetes Brother    Hypertension Brother    Depression Maternal Grandmother    Heart disease Maternal Grandfather    Hypertension Paternal Grandfather    Stroke Paternal Grandfather    Breast cancer Neg Hx    Colon cancer Neg Hx    Esophageal cancer Neg Hx    Rectal cancer Neg Hx    Stomach cancer Neg Hx    Social History   Socioeconomic History   Marital status: Married    Spouse name: Philp   Number of children: 3   Years of education: Not on file   Highest education level: 12th grade  Occupational History    Occupation: RETIRED  Tobacco Use   Smoking status: Never   Smokeless tobacco: Never  Vaping Use   Vaping status: Never Used  Substance and Sexual Activity   Alcohol use: No   Drug use: No   Sexual activity: Yes  Other Topics Concern   Not on file  Social History Narrative   Lives with husband   Social Drivers of Corporate investment banker Strain:  Low Risk  (11/18/2023)   Overall Financial Resource Strain (CARDIA)    Difficulty of Paying Living Expenses: Not very hard  Food Insecurity: No Food Insecurity (11/18/2023)   Hunger Vital Sign    Worried About Running Out of Food in the Last Year: Never true    Ran Out of Food in the Last Year: Never true  Transportation Needs: No Transportation Needs (11/18/2023)   PRAPARE - Administrator, Civil Service (Medical): No    Lack of Transportation (Non-Medical): No  Physical Activity: Unknown (11/18/2023)   Exercise Vital Sign    Days of Exercise per Week: 1 day    Minutes of Exercise per Session: Not on file  Stress: Stress Concern Present (11/18/2023)   [employer] of Occupational Health - Occupational Stress Questionnaire    Feeling of Stress : To some extent  Social Connections: Socially Integrated (11/18/2023)   Social Connection and Isolation Panel [NHANES]    Frequency of Communication with Friends and Family: More than three times a week    Frequency of Social Gatherings with Friends and Family: More than three times a week    Attends Religious Services: More than 4 times per year    Active Member of [employer] or Organizations: Yes    Attends Engineer, structural: More than 4 times per year    Marital Status: Married    Tobacco Counseling Counseling given: Not Answered    Clinical Intake:     Pain : No/denies pain     BMI - recorded: 31.76 Nutritional Status: BMI > 30  Obese Nutritional Risks: None Diabetes: No  Lab Results  Component Value Date   HGBA1C 6.1 10/08/2023   HGBA1C 5.9  10/05/2022   HGBA1C 5.9 12/29/2021     How often do you need to have someone help you when you read instructions, pamphlets, or other written materials from your doctor or pharmacy?: 1 - Never  Interpreter Needed?: No  Information entered by :: Keyontae Huckeby, RMA   Activities of Daily Living     11/18/2023   11:32 PM 11/14/2023   12:02 AM  In your present state of health, do you have any difficulty performing the following activities:  Hearing? 0 0  Vision? 0 0  Difficulty concentrating or making decisions? 0 0  Walking or climbing stairs? 1   Dressing or bathing? 0   Doing errands, shopping? 0 0  Preparing Food and eating ? N   Using the Toilet? N   In the past six months, have you accidently leaked urine? Y   Do you have problems with loss of bowel control? N   Managing your Medications? N   Managing your Finances? N   Housekeeping or managing your Housekeeping? N     Patient Care Team: Colene Dauphin, MD as PCP - General (Internal Medicine) Cindra Cree, MD as Consulting Physician (Ophthalmology)  Indicate any recent Medical Services you may have received from other than [facility] providers in the past year (date may be approximate).     Assessment:   This is a routine wellness examination for Jobina.  Hearing/Vision screen Hearing Screening - Comments:: Denies hearing difficulties   Vision Screening - Comments:: Wears eyeglasses   Goals Addressed             This Visit's Progress    My goal for 2024 is to maintain my health by staying healthy.   On track      Depression Screen  11/21/2023   11:40 AM 10/08/2023    1:21 PM 10/05/2022   10:26 AM 06/02/2022   11:29 AM 12/29/2021    9:22 AM 05/12/2021    9:30 AM 11/14/2019   10:32 AM  PHQ 2/9 Scores  PHQ - 2 Score 0 1 2 0 3 0 1  PHQ- 9 Score 0  9  4  5     Fall Risk     11/18/2023   11:32 PM 10/08/2023    1:21 PM 10/05/2022   10:25 AM 06/02/2022   11:19 AM 12/29/2021    9:22 AM  Fall Risk   Falls in the  past year? 1 0 1 0 0  Number falls in past yr: 0 0 0 0 0  Injury with Fall? 1 0 1 0 0  Risk for fall due to : Impaired balance/gait No Fall Risks History of fall(s) No Fall Risks No Fall Risks  Follow up Falls evaluation completed;Falls prevention discussed Falls evaluation completed Falls evaluation completed Falls prevention discussed Falls evaluation completed    MEDICARE RISK AT HOME:  Medicare Risk at Home Any stairs in or around the home?: (Patient-Rptd) No Home free of loose throw rugs in walkways, pet beds, electrical cords, etc?: (Patient-Rptd) Yes Adequate lighting in your home to reduce risk of falls?: (Patient-Rptd) Yes Life alert?: (Patient-Rptd) No Use of a cane, walker or w/c?: (Patient-Rptd) Yes Grab bars in the bathroom?: (Patient-Rptd) Yes Shower chair or bench in shower?: (Patient-Rptd) Yes Elevated toilet seat or a handicapped toilet?: (Patient-Rptd) Yes  TIMED UP AND GO:  Was the test performed?  No  Cognitive Function: Declined/Normal: No cognitive concerns noted by patient or family. Patient alert, oriented, able to answer questions appropriately and recall recent events. No signs of memory loss or confusion.        06/02/2022   11:31 AM  6CIT Screen  What Year? 0 points  What month? 0 points  What time? 0 points  Count back from 20 0 points  Months in reverse 0 points  Repeat phrase 0 points  Total Score 0 points    Immunizations Immunization History  Administered Date(s) Administered   Fluad Quad(high Dose 65+) 03/29/2019, 05/12/2021, 05/17/2022   Fluad Trivalent(High Dose 65+) 05/07/2023   Influenza, High Dose Seasonal PF 03/31/2018   Influenza,inj,Quad PF,6+ Mos 04/26/2017   Influenza-Unspecified 03/31/2020   Moderna Sars-Covid-2 Vaccination 09/18/2019, 10/20/2019, 06/01/2020   Pneumococcal Conjugate-13 03/31/2018   Pneumococcal Polysaccharide-23 03/29/2019   Tdap 07/25/2015   Zoster Recombinant(Shingrix) 03/31/2020, 10/08/2020     Screening Tests Health Maintenance  Topic Date Due   COVID-19 Vaccine (4 - 2024-25 season) 03/25/2023   Medicare Annual Wellness (AWV)  06/03/2023   INFLUENZA VACCINE  02/22/2024   MAMMOGRAM  11/20/2024   DTaP/Tdap/Td (2 - Td or Tdap) 07/24/2025   DEXA SCAN  04/05/2026   Colonoscopy  11/16/2030   Pneumonia Vaccine 95+ Years old  Completed   Hepatitis C Screening  Completed   Zoster Vaccines- Shingrix  Completed   HPV VACCINES  Aged Out   Meningococcal B Vaccine  Aged Out    Health Maintenance  Health Maintenance Due  Topic Date Due   COVID-19 Vaccine (4 - 2024-25 season) 03/25/2023   Medicare Annual Wellness (AWV)  06/03/2023   Health Maintenance Items Addressed: See Nurse Notes  Additional Screening:  Vision Screening: Recommended annual ophthalmology exams for early detection of glaucoma and other disorders of the eye.  Dental Screening: Recommended annual dental exams for proper oral hygiene  Community Resource Referral / Chronic Care Management: CRR required this visit?  No   CCM required this visit?  No     Plan:     I have personally reviewed and noted the following in the patient's chart:   Medical and social history Use of alcohol, tobacco or illicit drugs  Current medications and supplements including opioid prescriptions. Patient is not currently taking opioid prescriptions. Functional ability and status Nutritional status Physical activity Advanced directives List of other physicians Hospitalizations, surgeries, and ER visits in previous 12 months Vitals Screenings to include cognitive, depression, and falls Referrals and appointments  In addition, I have reviewed and discussed with patient certain preventive protocols, quality metrics, and best practice recommendations. A written personalized care plan for preventive services as well as general preventive health recommendations were provided to patient.     Albertus Chiarelli L Hensley Aziz,  CMA   11/21/2023   After Visit Summary: (MyChart) Due to this being a telephonic visit, the after visit summary with patients personalized plan was offered to patient via MyChart   Notes: Nothing significant to report at this time.

## 2023-11-22 ENCOUNTER — Inpatient Hospital Stay: Admitting: Internal Medicine

## 2023-11-29 ENCOUNTER — Encounter: Payer: Self-pay | Admitting: Pediatrics

## 2023-11-29 ENCOUNTER — Other Ambulatory Visit (INDEPENDENT_AMBULATORY_CARE_PROVIDER_SITE_OTHER)

## 2023-11-29 DIAGNOSIS — K922 Gastrointestinal hemorrhage, unspecified: Secondary | ICD-10-CM

## 2023-11-29 LAB — CBC WITH DIFFERENTIAL/PLATELET
Basophils Absolute: 0.1 10*3/uL (ref 0.0–0.1)
Basophils Relative: 0.6 % (ref 0.0–3.0)
Eosinophils Absolute: 0.2 10*3/uL (ref 0.0–0.7)
Eosinophils Relative: 1.7 % (ref 0.0–5.0)
HCT: 30.3 % — ABNORMAL LOW (ref 36.0–46.0)
Hemoglobin: 9.9 g/dL — ABNORMAL LOW (ref 12.0–15.0)
Lymphocytes Relative: 31.1 % (ref 12.0–46.0)
Lymphs Abs: 3.1 10*3/uL (ref 0.7–4.0)
MCHC: 32.6 g/dL (ref 30.0–36.0)
MCV: 82.4 fl (ref 78.0–100.0)
Monocytes Absolute: 0.9 10*3/uL (ref 0.1–1.0)
Monocytes Relative: 8.7 % (ref 3.0–12.0)
Neutro Abs: 5.8 10*3/uL (ref 1.4–7.7)
Neutrophils Relative %: 57.9 % (ref 43.0–77.0)
Platelets: 299 10*3/uL (ref 150.0–400.0)
RBC: 3.68 Mil/uL — ABNORMAL LOW (ref 3.87–5.11)
RDW: 16.3 % — ABNORMAL HIGH (ref 11.5–15.5)
WBC: 10.1 10*3/uL (ref 4.0–10.5)

## 2023-12-13 ENCOUNTER — Ambulatory Visit: Admitting: Gastroenterology

## 2023-12-17 ENCOUNTER — Encounter: Payer: Self-pay | Admitting: Internal Medicine

## 2023-12-18 ENCOUNTER — Telehealth: Admitting: Physician Assistant

## 2023-12-18 DIAGNOSIS — J069 Acute upper respiratory infection, unspecified: Secondary | ICD-10-CM | POA: Diagnosis not present

## 2023-12-18 MED ORDER — DOXYCYCLINE HYCLATE 100 MG PO TABS: 100.0000 mg | ORAL_TABLET | Freq: Two times a day (BID) | ORAL | 0 refills | Status: DC

## 2023-12-18 NOTE — Progress Notes (Signed)

## 2023-12-18 NOTE — Progress Notes (Signed)
 I have spent 5 minutes in review of e-visit questionnaire, review and updating patient chart, medical decision making and response to patient.   Piedad Climes, PA-C

## 2023-12-18 NOTE — Progress Notes (Unsigned)
 [facility] Return Visit   Referring Provider Colene Dauphin, MD [address] [place],  [place] [address]  Primary Care Provider Donnette Gal, Beckey Bourgeois, MD  Patient Profile: Susan Norris is a 73 y.o. female with a past medical history noteworthy for asthma, allergic rhinitis, CAD, HLD, prediabetes, OSA, who returns to the [facility] for follow-up of the problem(s) noted below.  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 was last seen during inpatient GI consultation April 2025   Current GI Meds  Omeprazole  40 mg daily  Interval History  -- [facility] for acute anemia (hgb 6.9) in the setting of NSAID use and steroids -- Akaylah never reported overt GI bleeding-no melena or hematochezia - only weakness and fatigue -- Small bowel enteroscopy negative -- Colonoscopy did not show any evidence of active IBD but did reveal 2 nonbleeding colonic angioectasias treated with APC and clip  -- Has been taking iron supplement with Vitron-C daily since hospitalization -- Hemoglobin status post hospitalization stable and increasing 9 --> 11 at today's visit  -- Denies any evidence of overt melena or hematochezia -- Stools have been dark in color on iron supplementation -- No abdominal pain or cramping -- Denies upper GI symptoms of nausea, vomiting, GERD, dysphagia, odynophagia, dysphagia -- No extraintestinal manifestations of IBD  -- Discussed that the colon angioectasias could have been the source of microscopic shedding of blood that contributed to her occult anemia -- If anemia recurs despite treatment of the lesions we will consider video capsule endoscopy to survey for small bowel Crohn's disease and small bowel angioectasias -- She reports that her father had anemia and required periodic blood transfusions but  is not sure of the source of anemia  -- Has recurrent symptoms of bronchitis and states she was recently prescribed doxycycline  which she has not yet started  Last colonoscopy: 10/2023 - 2 nonbleeding cecal angioectasias s/p APC, clip, no active IBD Last endoscopy: SBE 10/2023 - normal, small HH  Last Abd CT/CTE/MRE: CTAP 10/2023 -no acute abnormality in abdomen or pelvis, colonic diverticulosis without diverticulitis  GI Review of Symptoms Significant for none. Otherwise negative.  General Review of Systems  Review of systems is significant for the pertinent positives and negatives as listed per the HPI.  Full ROS is otherwise negative.  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 Mathew Solomon, [place]) -colitis from anal verge to 30 cm -chronic active colitis on biopsies -- No active inflammation on colonoscopies 2021, 2025  IBD Medication History Prednisone  Steroid enemas Sulfasalazine   Past Medical History   Past Medical History:  Diagnosis Date   Allergic rhinitis    Allergy    Allergy-induced asthma    Anxiety    Arthritis    knees    Asthma    Blood transfusion without reported diagnosis    at 73yo   CAD (coronary artery disease)    Cataract    removed bilateral eyes   COVID 12/21/2020   Crohn's colitis (HCC)    Depression    Fatty liver    GERD (gastroesophageal  reflux disease)    History of chicken pox    History of shingles    HLD (hyperlipidemia)    Hyperlipidemia    Neoplasm of tongue    Osteopenia    Prediabetes    Rectocele    Sensorineural hearing loss    Vertigo    Vitamin D  deficiency      Past Surgical History   Past Surgical History:  Procedure Laterality Date    ABDOMINAL HYSTERECTOMY     total   CESAREAN SECTION     COLONOSCOPY  10/2015   completed in [facility]    COLONOSCOPY N/A 11/16/2023   Procedure: COLONOSCOPY;  Surgeon: Truddie Furrow, MD;  Location: [facility];  Service: Gastroenterology;  Laterality: N/A;   ENTEROSCOPY N/A 11/15/2023   Procedure: ENTEROSCOPY;  Surgeon: Truddie Furrow, MD;  Location: [facility];  Service: Gastroenterology;  Laterality: N/A;   OOPHORECTOMY Left    TONSILLECTOMY AND ADENOIDECTOMY     UPPER GASTROINTESTINAL ENDOSCOPY       Allergies and Medications   No Active Allergies   Current Meds  Medication Sig   albuterol  (VENTOLIN  HFA) 108 (90 Base) MCG/ACT inhaler Inhale 2 puffs into the lungs every 2 (two) hours as needed for wheezing or shortness of breath (cough).   ascorbic acid (VITAMIN C) 500 MG tablet Take 1,000 mg by mouth daily.   [Paused] aspirin 81 MG EC tablet Take 81 mg by mouth daily. Swallow whole.   Calcium  Carbonate-Vit D-Min (CALCIUM  1200 PO) Take 1,200 mg by mouth daily.   cetirizine (ZYRTEC) 10 MG tablet Take 10 mg by mouth daily.   Cholecalciferol (VITAMIN D3 PO) Take 250 mcg by mouth daily.   Coenzyme Q10 (COQ-10) 100 MG CAPS Take 100 mg by mouth daily.   Cranberry-Vitamin C (AZO CRANBERRY URINARY TRACT PO) Take 1 tablet by mouth daily.   Cyanocobalamin (B-12 PO) Take 500 mcg by mouth daily.   doxycycline  (VIBRA -TABS) 100 MG tablet Take 1 tablet (100 mg total) by mouth 2 (two) times daily.   escitalopram  (LEXAPRO ) 20 MG tablet Take 1 tablet (20 mg total) by mouth daily.   estradiol  (ESTRACE ) 1 MG tablet Take 1 tablet (1 mg total) by mouth daily.   fluticasone  (FLONASE ) 50 MCG/ACT nasal spray Place 2 sprays into both nostrils daily. (Patient taking differently: Place 2 sprays into both nostrils daily as needed for allergies.)   meclizine  (ANTIVERT ) 25 MG tablet Take 1 tablet (25 mg total) by mouth 3 (three) times daily as needed for dizziness.   Omega-3 Fatty Acids (FISH OIL PO) Take  1,200 mg by mouth daily.   omeprazole  (PRILOSEC) 40 MG capsule TAKE 1 CAPSULE (40 MG TOTAL) BY MOUTH DAILY.   rosuvastatin  (CRESTOR ) 10 MG tablet TAKE 1 TABLET BY MOUTH EVERY DAY (Patient taking differently: Take 10 mg by mouth at bedtime.)   Turmeric (QC TUMERIC COMPLEX PO) Take 2,400 mg by mouth daily.   vitamin E 180 MG (400 UNITS) capsule Take 400 Units by mouth daily.   zinc gluconate 50 MG tablet Take 50 mg by mouth daily.     Family His   Family History  Problem Relation Age of Onset   Diabetes Mother    Atrial fibrillation Mother    Thyroid  disease Mother    Arthritis Father    Asthma Father    Heart disease Father    Hyperlipidemia Father    Hypertension Father    Kidney disease Father    Diabetes Brother  Hypertension Brother    Depression Maternal Grandmother    Heart disease Maternal Grandfather    Hypertension Paternal Grandfather    Stroke Paternal Grandfather    Breast cancer Neg Hx    Colon cancer Neg Hx    Esophageal cancer Neg Hx    Rectal cancer Neg Hx    Stomach cancer Neg Hx     Social History   Social History   Tobacco Use   Smoking status: Never   Smokeless tobacco: Never  Vaping Use   Vaping status: Never Used  Substance Use Topics   Alcohol use: No   Drug use: No   Abbee reports that she has never smoked. She has never used smokeless tobacco. She reports that she does not drink alcohol and does not use drugs.  Vital Signs and Physical Examination   Vitals:   12/19/23 1134  BP: (!) 140/68  Pulse: 62   Body mass index is 31.58 kg/m. Weight: 184 lb (83.5 kg)  General: Well developed, well nourished, no acute distress Head: Normocephalic and atraumatic Eyes: Sclerae anicteric, EOMI Lungs: Clear throughout to auscultation Heart: Regular rate and rhythm; No murmurs, rubs or bruits Abdomen: Soft, non tender and non distended. No masses, hepatosplenomegaly or hernias noted. Normal Bowel sounds Rectal: Deferred Musculoskeletal:  Symmetrical with no gross deformities   Review of Data   The following data was reviewed at the time of this encounter:   Laboratory Studies      Latest Ref Rng & Units 12/19/2023   12:08 PM 11/29/2023   12:07 PM 11/16/2023    3:46 AM  CBC  WBC 4.0 - 10.5 K/uL 6.9  10.1  11.5   Hemoglobin 12.0 - 15.0 g/dL 16.1  9.9  9.4   Hematocrit 36.0 - 46.0 % 34.5  30.3  30.8   Platelets 150.0 - 400.0 K/uL 208.0  299.0  371     No results found for: "LIPASE"    Latest Ref Rng & Units 11/15/2023    3:47 AM 11/13/2023    4:38 PM 10/08/2023    2:44 PM  CMP  Glucose 70 - 99 mg/dL 88  096  95   BUN 8 - 23 mg/dL 9  14  15    Creatinine 0.44 - 1.00 mg/dL 0.45  4.09  8.11   Sodium 135 - 145 mmol/L 136  139  135   Potassium 3.5 - 5.1 mmol/L 3.7  4.0  4.0   Chloride 98 - 111 mmol/L 103  101  99   CO2 22 - 32 mmol/L 25  26  26    Calcium  8.9 - 10.3 mg/dL 8.4  9.0  9.4   Total Protein 6.5 - 8.1 g/dL  6.2  7.1   Total Bilirubin 0.0 - 1.2 mg/dL  <9.1  0.4   Alkaline Phos 38 - 126 U/L  59  50   AST 15 - 41 U/L  17  18   ALT 0 - 44 U/L  8  11    IBD Labs  Prebiologic Labs   Therapeutic Drug Monitoring  Thiopurine metabolite levels:  Date:                6-TGN       6-MMP  Biologic level and antibodies:  Fecal Calprotectin   Imaging Studies  CTAP 10/2023 1. No acute abnormality in the abdomen or pelvis. 2.  Colonic diverticulosis without diverticulitis. 3. Partially visualized moderate hiatal hernia.  GI Procedures and Studies  Colonoscopy 10/2023  2 nonbleeding cecal angioectasias s/p APC, clip, no active IBD  SBE 10/2023 Normal, small HH  EGD/Colonoscopy 02/2020 EGD - patchy mild inflammation in gastric body, small HH Colonoscopy - two 2-4 mm polyps, IH  EGD/Colonoscopy 10/2015 EGD - benign distal esophageal stricture dilated to 15-16.5-18 mm Colonoscopy - patchy colitis to 30 cm   Clinical Impression  It is my clinical impression that Ms. Pfefferkorn is a 73 y.o. 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  She was admitted to [facility] in April 2025 with shortness of breath and weakness found to have nadir hemoglobin 6.9 down from previous value of 11.8 one-month prior.  Pollyann denied evidence of overt GI bleeding.  She had been taking NSAIDs and prednisone  prior to her hospitalization.  SBE was negative.  Colonoscopy showed 2 cecal angioectasias which were treated with APC and clips.  At the time of her hospitalization we discussed that it is possible she had microscopic shedding of blood over time from the angioectasias that contributed to her occult anemia.  Since hospital discharge she has been taking Vitron-C.  Labs today show improvement in hemoglobin to 11 as well as an increase in her iron levels.  We discussed continuing iron supplementation.  If she manifests recurrent anemia in the future we will consider a video capsule endoscopy to evaluate for small bowel Crohn's disease or angioectasias in the small intestine.  In reviewing her IBD history, Analeigha reports being diagnosed with Crohn's ileitis at the age of 32 after presenting with lower GI bleeding.  She was treated with steroids on and off for several years as well as sulfasalazine..  Reports that her IBD became quiescent in the years that she was pregnant.  Her records indicate that she was subsequently diagnosed with ulcerative proctitis in the 1990s and treated with systemic and rectal steroids.  Colonoscopy in 2017 performed in [place] showed colitis extending from the anal verge to 30 cm with chronic active colitis on biopsies.  More recently, Wendall Halls has not been on any medical therapy specifically targeted towards IBD.  Colonoscopies in 2021 and 2025 did not show any evidence of  active inflammation.  Currently her IBD phenotype remains indeterminate between Crohn's disease and ulcerative colitis.  Plan  Labs today: CBC, iron panel Continue Vitron-C 1 tablet daily Repeat labs with CBC and iron panel in 6 to 8 weeks If there is evidence of recurrent anemia we will consider video capsule endoscopy to survey the small bowel for active Crohn's disease and angioectasias Monitor for symptoms of IBD and consider periodic fecal calprotectin assessments for noninvasive monitoring In the absence of active inflammation, patient can undergo surveillance colonoscopies every 3 to 5 years-next due between 2028-20 30  IBD Health Maintenance  Vaccinations Influenza: PCV13: PPSV23: COVID19: HAV/HBV: Shingles: HPV:  DEXA Date:                            Result:  Pap Smear   Eye Exam PRN  Skin Exam N/A  Surveillance Colonoscopy Normal 10/2023 - Due 2028-2030  Tobacco Use None  Depression Screen    Planned Follow Up 4-6 months  The patient or caregiver verbalized understanding of the material covered, with no barriers to understanding. All questions were answered.  Patient or caregiver is agreeable with the plan outlined above.    It was a pleasure to see Wafa.  If you have any questions or concerns regarding this evaluation, do not hesitate to contact me.  Eugenia Hess, MD [facility]

## 2023-12-18 NOTE — Progress Notes (Signed)
 Message sent to patient requesting further input regarding current symptoms. Awaiting patient response.

## 2023-12-19 ENCOUNTER — Other Ambulatory Visit (INDEPENDENT_AMBULATORY_CARE_PROVIDER_SITE_OTHER)

## 2023-12-19 ENCOUNTER — Ambulatory Visit: Admitting: Pediatrics

## 2023-12-19 ENCOUNTER — Encounter: Payer: Self-pay | Admitting: Pediatrics

## 2023-12-19 ENCOUNTER — Ambulatory Visit: Payer: Self-pay | Admitting: Pediatrics

## 2023-12-19 VITALS — BP 140/68 | HR 62 | Ht 64.0 in | Wt 184.0 lb

## 2023-12-19 DIAGNOSIS — K922 Gastrointestinal hemorrhage, unspecified: Secondary | ICD-10-CM

## 2023-12-19 DIAGNOSIS — K5520 Angiodysplasia of colon without hemorrhage: Secondary | ICD-10-CM

## 2023-12-19 DIAGNOSIS — K648 Other hemorrhoids: Secondary | ICD-10-CM

## 2023-12-19 DIAGNOSIS — K50919 Crohn's disease, unspecified, with unspecified complications: Secondary | ICD-10-CM

## 2023-12-19 DIAGNOSIS — K31819 Angiodysplasia of stomach and duodenum without bleeding: Secondary | ICD-10-CM

## 2023-12-19 DIAGNOSIS — D649 Anemia, unspecified: Secondary | ICD-10-CM | POA: Diagnosis not present

## 2023-12-19 DIAGNOSIS — K589 Irritable bowel syndrome without diarrhea: Secondary | ICD-10-CM

## 2023-12-19 DIAGNOSIS — D500 Iron deficiency anemia secondary to blood loss (chronic): Secondary | ICD-10-CM

## 2023-12-19 LAB — CBC WITH DIFFERENTIAL/PLATELET
Basophils Absolute: 0 10*3/uL (ref 0.0–0.1)
Basophils Relative: 0.3 % (ref 0.0–3.0)
Eosinophils Absolute: 0.3 10*3/uL (ref 0.0–0.7)
Eosinophils Relative: 4.1 % (ref 0.0–5.0)
HCT: 34.5 % — ABNORMAL LOW (ref 36.0–46.0)
Hemoglobin: 11 g/dL — ABNORMAL LOW (ref 12.0–15.0)
Lymphocytes Relative: 29.1 % (ref 12.0–46.0)
Lymphs Abs: 2 10*3/uL (ref 0.7–4.0)
MCHC: 31.8 g/dL (ref 30.0–36.0)
MCV: 84.1 fl (ref 78.0–100.0)
Monocytes Absolute: 1.1 10*3/uL — ABNORMAL HIGH (ref 0.1–1.0)
Monocytes Relative: 16.1 % — ABNORMAL HIGH (ref 3.0–12.0)
Neutro Abs: 3.5 10*3/uL (ref 1.4–7.7)
Neutrophils Relative %: 50.4 % (ref 43.0–77.0)
Platelets: 208 10*3/uL (ref 150.0–400.0)
RBC: 4.1 Mil/uL (ref 3.87–5.11)
RDW: 19.7 % — ABNORMAL HIGH (ref 11.5–15.5)
WBC: 6.9 10*3/uL (ref 4.0–10.5)

## 2023-12-19 LAB — IBC + FERRITIN
Ferritin: 34.1 ng/mL (ref 10.0–291.0)
Iron: 36 ug/dL — ABNORMAL LOW (ref 42–145)
Saturation Ratios: 9 % — ABNORMAL LOW (ref 20.0–50.0)
TIBC: 399 ug/dL (ref 250.0–450.0)
Transferrin: 285 mg/dL (ref 212.0–360.0)

## 2023-12-19 NOTE — Patient Instructions (Signed)
 Your provider has requested that you go to the basement level for lab work before leaving today. Press "B" on the elevator. The lab is located at the first door on the left as you exit the elevator.  Due to recent changes in healthcare laws, you may see the results of your imaging and laboratory studies on MyChart before your provider has had a chance to review them.  We understand that in some cases there may be results that are confusing or concerning to you. Not all laboratory results come back in the same time frame and the provider may be waiting for multiple results in order to interpret others.  Please give us  48 hours in order for your provider to thoroughly review all the results before contacting the office for clarification of your results.    Follow up in 4-6 months.  Thank you for entrusting me with your care and for choosing [facility],  Dr. Eugenia Hess   _______________________________________________________  If your blood pressure at your visit was 140/90 or greater, please contact your primary care physician to follow up on this.  _______________________________________________________  If you are age 39 or older, your body mass index should be between 23-30. Your Body mass index is 31.58 kg/m. If this is out of the aforementioned range listed, please consider follow up with your Primary Care Provider.  If you are age 62 or younger, your body mass index should be between 19-25. Your Body mass index is 31.58 kg/m. If this is out of the aformentioned range listed, please consider follow up with your Primary Care Provider.   ________________________________________________________  The [facility] providers would like to encourage you to use MYCHART to communicate with providers for non-urgent requests or questions.  Due to long hold times on the telephone, sending your provider a message by [facility] may be a faster and more efficient way to get a response.  Please  allow 48 business hours for a response.  Please remember that this is for non-urgent requests.  _______________________________________________________

## 2023-12-25 ENCOUNTER — Encounter: Payer: Self-pay | Admitting: Internal Medicine

## 2023-12-25 ENCOUNTER — Ambulatory Visit: Payer: Self-pay

## 2023-12-25 ENCOUNTER — Ambulatory Visit (INDEPENDENT_AMBULATORY_CARE_PROVIDER_SITE_OTHER): Admitting: Internal Medicine

## 2023-12-25 VITALS — BP 130/90 | HR 76 | Temp 98.1°F | Ht 64.0 in | Wt 181.0 lb

## 2023-12-25 DIAGNOSIS — Z636 Dependent relative needing care at home: Secondary | ICD-10-CM | POA: Diagnosis not present

## 2023-12-25 DIAGNOSIS — F419 Anxiety disorder, unspecified: Secondary | ICD-10-CM

## 2023-12-25 DIAGNOSIS — F32A Depression, unspecified: Secondary | ICD-10-CM | POA: Diagnosis not present

## 2023-12-25 DIAGNOSIS — R030 Elevated blood-pressure reading, without diagnosis of hypertension: Secondary | ICD-10-CM | POA: Insufficient documentation

## 2023-12-25 DIAGNOSIS — J22 Unspecified acute lower respiratory infection: Secondary | ICD-10-CM | POA: Insufficient documentation

## 2023-12-25 MED ORDER — AZITHROMYCIN 250 MG PO TABS: ORAL_TABLET | ORAL | 0 refills | Status: DC

## 2023-12-25 MED ORDER — PREDNISONE 20 MG PO TABS: 20.0000 mg | ORAL_TABLET | Freq: Every day | ORAL | 0 refills | Status: AC

## 2023-12-25 MED ORDER — BENZONATATE 200 MG PO CAPS: 200.0000 mg | ORAL_CAPSULE | Freq: Three times a day (TID) | ORAL | 0 refills | Status: DC | PRN

## 2023-12-25 MED ORDER — CLONAZEPAM 0.5 MG PO TABS: 0.5000 mg | ORAL_TABLET | Freq: Two times a day (BID) | ORAL | 1 refills | Status: AC | PRN

## 2023-12-25 NOTE — Progress Notes (Signed)
 Subjective:    Patient ID: Susan Norris, female    DOB: 1951/04/06, 73 y.o.   MRN: [id number]      HPI Katria is here for  Chief Complaint  Patient presents with   Cough    Cough x 1 month   Anxiety    Increased anxiety-this is subacute She is taking her Lexapro  daily.  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  She has exhausted, sick and overwhelmed.  She has felt that the Lexapro  has always worked well.  She is having a hard time taking care of herself and her husband.  She has had cold symptoms for 1 month.  After returning from her granddaughter's graduation her cough was even worse and she did have a fever at that point for 5 days, but has not had one since then-that was over a week ago.  She did do a virtual visit and was prescribed doxycycline  for 7 days which she completed yesterday.  She has not really felt like it made much.  She lost her smell 3 days ago.  She has had a productive cough of white sputum.  She has nasal congestion and is blowing her nose a lot and that mucus is green at times.      Medications and allergies reviewed with patient and updated if appropriate.  Current Outpatient Medications on File Prior to Visit  Medication Sig Dispense Refill   albuterol  (VENTOLIN  HFA) 108 (90 Base) MCG/ACT inhaler Inhale 2 puffs into the lungs every 2 (two) hours as needed for wheezing or shortness of breath (cough). 8 g 0   ascorbic acid (VITAMIN C) 500 MG tablet Take 1,000 mg by mouth daily.     [Paused] aspirin 81 MG EC tablet Take 81 mg by mouth daily. Swallow whole.     Calcium  Carbonate-Vit D-Min (CALCIUM  1200 PO) Take 1,200 mg by mouth daily.     cetirizine (ZYRTEC) 10 MG tablet Take 10 mg by mouth daily.     Cholecalciferol (VITAMIN D3 PO) Take 250 mcg by mouth daily.  5   Coenzyme Q10 (COQ-10) 100 MG CAPS Take 100 mg by mouth daily.     Cranberry-Vitamin C (AZO CRANBERRY URINARY TRACT PO) Take 1 tablet by mouth daily.      Cyanocobalamin (B-12 PO) Take 500 mcg by mouth daily.     doxycycline  (VIBRA -TABS) 100 MG tablet Take 1 tablet (100 mg total) by mouth 2 (two) times daily. 14 tablet 0   escitalopram  (LEXAPRO ) 20 MG tablet Take 1 tablet (20 mg total) by mouth daily. 90 tablet 1   estradiol  (ESTRACE ) 1 MG tablet Take 1 tablet (1 mg total) by mouth daily. 90 tablet 2   fluticasone  (FLONASE ) 50 MCG/ACT nasal spray Place 2 sprays into both nostrils daily. (Patient taking differently: Place 2 sprays into both nostrils daily as needed for allergies.) 9.9 g 0   meclizine  (ANTIVERT ) 25 MG tablet Take 1 tablet (25 mg total) by mouth 3 (three) times daily as needed for dizziness. 30 tablet 2   Omega-3 Fatty Acids (FISH OIL PO) Take 1,200 mg by mouth daily.     omeprazole  (PRILOSEC) 40 MG capsule TAKE 1 CAPSULE (40 MG TOTAL) BY MOUTH DAILY. 90 capsule 2   rosuvastatin  (CRESTOR ) 10 MG tablet TAKE 1 TABLET BY MOUTH EVERY DAY (Patient taking differently: Take 10 mg by mouth at bedtime.) 90 tablet 2   Turmeric (QC TUMERIC COMPLEX PO) Take 2,400 mg by  mouth daily.     vitamin E 180 MG (400 UNITS) capsule Take 400 Units by mouth daily.     zinc gluconate 50 MG tablet Take 50 mg by mouth daily.     No current facility-administered medications on file prior to visit.    Review of Systems  Constitutional:  Positive for fever (over one week ago x 3 days 100.5 - resolved).  HENT:  Positive for congestion. Negative for ear pain, postnasal drip, sinus pain and sore throat.        Ears clogged  Respiratory:  Positive for cough (productive) and wheezing. Negative for chest tightness and shortness of breath.   Neurological:  Negative for dizziness, light-headedness and headaches.       Objective:   Vitals:   12/25/23 1551  BP: (!) 160/110  Pulse: 76  Temp: 98.1 F (36.7 C)  SpO2: 98%   BP Readings from Last 3 Encounters:  12/25/23 (!) 160/110  12/19/23 (!) 140/68  11/16/23 (!) 133/47   Wt Readings from Last 3  Encounters:  12/25/23 181 lb (82.1 kg)  12/19/23 184 lb (83.5 kg)  11/21/23 185 lb (83.9 kg)   Body mass index is 31.07 kg/m.    Physical Exam Constitutional:      General: She is not in acute distress.    Appearance: Normal appearance. She is not ill-appearing.  HENT:     Head: Normocephalic and atraumatic.     Right Ear: Tympanic membrane, ear canal and external ear normal.     Left Ear: Tympanic membrane, ear canal and external ear normal.     Mouth/Throat:     Mouth: Mucous membranes are moist.     Pharynx: No oropharyngeal exudate or posterior oropharyngeal erythema.  Eyes:     Conjunctiva/sclera: Conjunctivae normal.  Cardiovascular:     Rate and Rhythm: Normal rate and regular rhythm.  Pulmonary:     Effort: Pulmonary effort is normal. No respiratory distress.     Breath sounds: Wheezing (Occasional expiratory) present. No rales.     Comments: Coarse breath sounds Musculoskeletal:     Cervical back: Neck supple. No tenderness.  Lymphadenopathy:     Cervical: No cervical adenopathy.  Skin:    General: Skin is warm and dry.  Neurological:     Mental Status: She is alert.  Psychiatric:     Comments: Anxious, crying initially            Assessment & Plan:    See Problem List for Assessment and Plan of chronic medical problems.

## 2023-12-25 NOTE — Telephone Encounter (Signed)
 Chief Complaint: Cough with white sputum Symptoms: Cough, frothy white sputum, wheezing Frequency: 2 weeks Pertinent Negatives: Patient denies chest pain, shortness of breath Disposition: [] ED /[] Urgent Care (no appt availability in office) / [x] Appointment(In office/virtual)/ []  [facility]/ [] Home Care/ [] Refused Recommended Disposition /[] [facility]/ []  Follow-up with PCP Additional Notes: Patient concerned about ongoing cough that is not improving with white, frothy sputum. Patient states her lungs feel clear and she has completed all antibiotics from recent illness, however; patient has concern this feels cardiac cough due to recognizing s/s of father's congestive heart failure. Patient also has wheezing with exhalation.   Copied from CRM [id number]. Topic: Clinical - Red Word Triage >> Dec 25, 2023  8:29 AM Alethia Huxley E wrote: [facility] that prompted transfer to Nurse Triage: Worsening cough. Patient has had a cough for 2 weeks that has not gone away. Stated her lungs are clear but concerned it is a cardiac cough. Reason for Disposition  Wheezing is present  Answer Assessment - Initial Assessment Questions 1. ONSET: "When did the cough begin?"      2 weeks 2. SEVERITY: "How bad is the cough today?"      "Cough and cough" 3. SPUTUM: "Describe the color of your sputum" (none, dry cough; clear, white, yellow, green)     White/Foamy 4. HEMOPTYSIS: "Are you coughing up any blood?" If so ask: "How much?" (flecks, streaks, tablespoons, etc.)     No 5. DIFFICULTY BREATHING: "Are you having difficulty breathing?" If Yes, ask: "How bad is it?" (e.g., mild, moderate, severe)    - MILD: No SOB at rest, mild SOB with walking, speaks normally in sentences, can lie down, no retractions, pulse < 100.    - MODERATE: SOB at rest, SOB with minimal exertion and prefers to sit, cannot lie down flat, speaks in phrases, mild retractions, audible wheezing, pulse 100-120.    - SEVERE: Very  SOB at rest, speaks in single words, struggling to breathe, sitting hunched forward, retractions, pulse > 120      No 6. FEVER: "Do you have a fever?" If Yes, ask: "What is your temperature, how was it measured, and when did it start?"     Fever first three days of illness - but not since then 7. CARDIAC HISTORY: "Do you have any history of heart disease?" (e.g., heart attack, congestive heart failure)      CAD scoring of 180 8. LUNG HISTORY: "Do you have any history of lung disease?"  (e.g., pulmonary embolus, asthma, emphysema)     Asthma 9. PE RISK FACTORS: "Do you have a history of blood clots?" (or: recent major surgery, recent prolonged travel, bedridden)     No 10. OTHER SYMPTOMS: "Do you have any other symptoms?" (e.g., runny nose, wheezing, chest pain)       Wheezy when breathing har  Protocols used: Cough - Acute Productive-A-AH

## 2023-12-25 NOTE — Patient Instructions (Addendum)
           Medications changes include :   clonazepam  0.5 mg twice a day as needed, zpak, prednisone , tessalon  perles    A referral was ordered social work and someone will call you to schedule an appointment.     Return if symptoms worsen or fail to improve.

## 2023-12-25 NOTE — Assessment & Plan Note (Signed)
 Chronic Not ideally controlled, most of which is related to caregiver stress and not feeling well herself Discussed changing Lexapro , but will continue Lexapro  20 mg daily since that has worked well Start clonazepam  0.5 mg twice daily as needed-this has worked for her in the past She needs better sleep which she will work on-she is getting someone in to help care for her husband a couple days a week a couple of hours-this is not much but may help some Referral to social work

## 2023-12-25 NOTE — Assessment & Plan Note (Signed)
 Chronic She has been carried for her husband for a while and he is now bedridden This is taking a toll on her own health-she is very anxious, has been sick for over a month, recent hospitalization with GI bleed, has pain that she has not been able to deal with because she is taking care of him She is not able to pay for full-time help, but will be having someone come in for a couple of hours a couple times a day to help her

## 2023-12-25 NOTE — Assessment & Plan Note (Signed)
 Subacute She has been sick for 1 month She completed 7 days of doxycycline  without much improvement This could be a viral infection, but she does have some slight wheezing coarse air sounds Given duration I think she needs further treatment Start Z-Pak Prednisone  20 mg daily x 5 days Tessalon  Perles She will let me know if there is no improvement

## 2023-12-25 NOTE — Assessment & Plan Note (Signed)
 BP was initially very high when she first came in-improved prior to leaving She was very anxious and upset when she first came in which explains why it was so elevated, but she has had couple of instances of elevated BP Encouraged her to monitor BP at home If BP stays elevated we will need to start medication

## 2024-01-03 ENCOUNTER — Telehealth: Payer: Self-pay | Admitting: *Deleted

## 2024-01-03 DIAGNOSIS — D2272 Melanocytic nevi of left lower limb, including hip: Secondary | ICD-10-CM | POA: Diagnosis not present

## 2024-01-03 DIAGNOSIS — L812 Freckles: Secondary | ICD-10-CM | POA: Diagnosis not present

## 2024-01-03 DIAGNOSIS — L718 Other rosacea: Secondary | ICD-10-CM | POA: Diagnosis not present

## 2024-01-03 DIAGNOSIS — D225 Melanocytic nevi of trunk: Secondary | ICD-10-CM | POA: Diagnosis not present

## 2024-01-03 DIAGNOSIS — I788 Other diseases of capillaries: Secondary | ICD-10-CM | POA: Diagnosis not present

## 2024-01-03 DIAGNOSIS — L821 Other seborrheic keratosis: Secondary | ICD-10-CM | POA: Diagnosis not present

## 2024-01-03 DIAGNOSIS — D1801 Hemangioma of skin and subcutaneous tissue: Secondary | ICD-10-CM | POA: Diagnosis not present

## 2024-01-03 NOTE — Progress Notes (Signed)
 Complex Care Management Note Care Guide Note  01/03/2024 Name: Susan Norris MRN: [id number] DOB: June 09, 1951   Complex Care Management Outreach Attempts: An unsuccessful telephone outreach was attempted today to offer the patient information about available complex care management services.  Follow Up Plan:  Additional outreach attempts will be made to offer the patient complex care management information and services.   Encounter Outcome:  Patient Request to Call Back  Kandis Ormond, CMA   [facility], [facility] Guide Direct Dial: [phone]  Fax: [phone] Website: [email or web address]

## 2024-01-04 NOTE — Progress Notes (Signed)
 Complex Care Management Note  Care Guide Note 01/04/2024 Name: [name] MRN: [id number] DOB: [birth year]  [name] is a 73 y.o. year old female who sees Burns, Beckey Bourgeois, MD for primary care. I reached out to [name] by phone today to offer complex care management services.  Ms. Rawlinson was given information about Complex Care Management services today including:   The Complex Care Management services include support from the care team which includes your Nurse Care Manager, Clinical Social Worker, or Pharmacist.  The Complex Care Management team is here to help remove barriers to the health concerns and goals most important to you. Complex Care Management services are voluntary, and the patient may decline or stop services at any time by request to their care team member.   Complex Care Management Consent Status: Patient agreed to services and verbal consent obtained.   Follow up plan:  Telephone appointment with complex care management team member scheduled for:  01/22/2024  Encounter Outcome:  Patient Scheduled  Kandis Ormond, CMA [place]  [facility], [facility] Guide Direct Dial: [phone]  Fax: [phone] Website: [email or web address]

## 2024-01-22 ENCOUNTER — Other Ambulatory Visit: Payer: Self-pay | Admitting: *Deleted

## 2024-01-22 NOTE — Patient Instructions (Signed)
 Visit Information  Thank you for taking time to visit with me today.   Following is a copy of your care plan:   Goals Addressed             This Visit's Progress    VBCI Social Work Care Plan-LCSW       Problems:   Caregiver stress, need for in home care resources  CSW Clinical Goal(s):    Interventions:  Mental Health:  Evaluation of current treatment plan related to Caregiver Stress Active listening / Reflection utilized Caregiver stress acknowledged :continued use of positive coping strategies encouraged  Consideration on in-home help encouraged : options discussed resources provided PHQ2/PHQ9 completed  Patient Goals/Self-Care Activities:  Review  in home care resources provided through email  to follow up on options for in home support.  Plan:   No further follow up required: in home care resources emailed to [email or web address]        Please call the Suicide and Crisis Lifeline: 988 if you are experiencing a Mental Health or Behavioral Health Crisis or need someone to talk to.  Patient verbalizes understanding of instructions and care plan provided today and agrees to view in MyChart. Active MyChart status and patient understanding of how to access instructions and care plan via MyChart confirmed with patient.     Ulonda Klosowski, LCSW [place]  [facility], [facility] Licensed Clinical Social Worker  Direct Dial: [phone]

## 2024-01-22 NOTE — Patient Outreach (Signed)
 Complex Care Management   Visit Note  01/22/2024  Name:  Susan Norris MRN: [id number] DOB: 1951/04/10  Situation: Referral received for Complex Care Management related to caregiver stress  Patient states that she is doing much better. Declined need for additional follow up. In home care resources requested. I obtained verbal consent from Patient.  Visit completed with patient.  on the phone  Background:   Past Medical History:  Diagnosis Date   Allergic rhinitis    Allergy    Allergy-induced asthma    Anxiety    Arthritis    knees    Asthma    Blood transfusion without reported diagnosis    at 73yo   CAD (coronary artery disease)    Cataract    removed bilateral eyes   COVID 12/21/2020   Crohn's colitis (HCC)    Depression    Fatty liver    GERD (gastroesophageal reflux disease)    History of chicken pox    History of shingles    HLD (hyperlipidemia)    Hyperlipidemia    Neoplasm of tongue    Osteopenia    Prediabetes    Rectocele    Sensorineural hearing loss    Vertigo    Vitamin D  deficiency     Assessment: Patient Reported Symptoms:  Cognitive Cognitive Status: Alert and oriented to person, place, and time, Normal speech and language skills, Poor judgment in daily scenarios   Health Maintenance Behaviors: Annual physical exam, Stress management Healing Pattern: Average Health Facilitated by: Prayer/meditation, Rest, Stress management  Neurological Neurological Review of Symptoms: No symptoms reported    HEENT HEENT Symptoms Reported: No symptoms reported      Cardiovascular Cardiovascular Symptoms Reported: No symptoms reported    Respiratory Respiratory Symptoms Reported: No symptoms reported    Endocrine Endocrine Symptoms Reported: No symptoms reported Is patient diabetic?: No    Gastrointestinal Gastrointestinal Symptoms Reported: No symptoms reported      Genitourinary Genitourinary Symptoms Reported: No symptoms reported    Integumentary  Integumentary Symptoms Reported: No symptoms reported    Musculoskeletal Musculoskelatal Symptoms Reviewed: Joint pain, Limited mobility Additional Musculoskeletal Details: Hip pain following a fall, considering a cortisone injection Musculoskeletal Comment: Had a MRI and CT Scan-will follow up with ortho following spouse's admission to rehab Falls in the past year?: No Number of falls in past year: 1 or less Was there an injury with Fall?: Yes Fall Risk Category Calculator: 1 Patient Fall Risk Level: Low Fall Risk    Psychosocial     Behaviors When Feeling Stressed/Fearful: expressing, read bible and pray Techniques to Cope with Loss/Stress/Change: Diversional activities, Spiritual practice(s) Quality of Family Relationships: supportive Do you feel physically threatened by others?: No      01/22/2024    2:00 PM  Depression screen PHQ 2/9  Decreased Interest 0  Down, Depressed, Hopeless 0  PHQ - 2 Score 0    There were no vitals filed for this visit.  Medications Reviewed Today     Reviewed by Ermalinda Lenn HERO, LCSW (Social Worker) on 01/22/24 at 1359  Med List Status: <None>   Medication Order Taking? Sig Documenting Provider Last Dose Status Informant  albuterol  (VENTOLIN  HFA) 108 (90 Base) MCG/ACT inhaler [id number] Yes Inhale 2 puffs into the lungs every 2 (two) hours as needed for wheezing or shortness of breath (cough). Muthersbaugh, Chiquita, PA-C  Active Self, Pharmacy Records  ascorbic acid (VITAMIN C) 500 MG tablet [id number] Yes Take 1,000 mg by mouth daily. [provider]  Active Self, Pharmacy Records  aspirin 81 MG EC tablet [id number]  Take 81 mg by mouth daily. Swallow whole. [provider]  Active Self, Pharmacy Records  azithromycin  (ZITHROMAX ) 250 MG tablet [id number]  Take two tabs the first day and then one tab daily for four days  Patient not taking: Reported on 01/22/2024   Geofm Glade PARAS, MD  Active   benzonatate  (TESSALON ) 200 MG capsule  [id number] Yes Take 1 capsule (200 mg total) by mouth 3 (three) times daily as needed for cough. Geofm Glade PARAS, MD  Active   Calcium  Carbonate-Vit D-Min (CALCIUM  1200 PO) [id number] Yes Take 1,200 mg by mouth daily. [provider]  Active Self, Pharmacy Records  cetirizine (ZYRTEC) 10 MG tablet [id number] Yes Take 10 mg by mouth daily. [provider]  Active Self, Pharmacy Records  Cholecalciferol (VITAMIN D3 PO) [id number] Yes Take 250 mcg by mouth daily. [provider]  Active Self, Pharmacy Records  clonazePAM  (KLONOPIN ) 0.5 MG tablet [id number] Yes Take 1 tablet (0.5 mg total) by mouth 2 (two) times daily as needed for anxiety. Geofm Glade PARAS, MD  Active   Coenzyme Q10 (COQ-10) 100 MG CAPS [id number] Yes Take 100 mg by mouth daily. [provider]  Active Self, Pharmacy Records  Cranberry-Vitamin C (AZO CRANBERRY URINARY TRACT PO) [id number] Yes Take 1 tablet by mouth daily. [provider]  Active Self, Pharmacy Records  Cyanocobalamin (B-12 PO) [id number] Yes Take 500 mcg by mouth daily. [provider]  Active Self, Pharmacy Records  escitalopram  (LEXAPRO ) 20 MG tablet [id number] Yes Take 1 tablet (20 mg total) by mouth daily. Geofm Glade PARAS, MD  Active Self, Pharmacy Records  estradiol  (ESTRACE ) 1 MG tablet [id number] Yes Take 1 tablet (1 mg total) by mouth daily. Geofm Glade PARAS, MD  Active Self, Pharmacy Records  fluticasone  (FLONASE ) 50 MCG/ACT nasal spray [id number] Yes Place 2 sprays into both nostrils daily.  Patient taking differently: Place 2 sprays into both nostrils daily as needed for allergies.   Muthersbaugh, Chiquita, PA-C  Active Self, Pharmacy Records  meclizine  (ANTIVERT ) 25 MG tablet [id number] Yes Take 1 tablet (25 mg total) by mouth 3 (three) times daily as needed for dizziness. Antonette Angeline ORN, NP  Active Self, Pharmacy Records  Omega-3 Fatty Acids (FISH OIL PO) [id number] Yes Take 1,200 mg by mouth daily. [provider]   Active Self, Pharmacy Records  omeprazole  (PRILOSEC) 40 MG capsule [id number] Yes TAKE 1 CAPSULE (40 MG TOTAL) BY MOUTH DAILY. Geofm Glade PARAS, MD  Active Self, Pharmacy Records  rosuvastatin  (CRESTOR ) 10 MG tablet [id number] Yes TAKE 1 TABLET BY MOUTH EVERY DAY  Patient taking differently: Take 10 mg by mouth at bedtime.   Geofm Glade PARAS, MD  Active Self, Pharmacy Records  Turmeric (QC [facility] COMPLEX PO) [id number] Yes Take 2,400 mg by mouth daily. [provider]  Active Self, Pharmacy Records  vitamin E 180 MG (400 UNITS) capsule [id number] Yes Take 400 Units by mouth daily. [provider]  Active Self, Pharmacy Records  zinc gluconate 50 MG tablet [id number] Yes Take 50 mg by mouth daily. [provider]  Active Self, Pharmacy Records            Recommendation:   PCP Follow-up Please review in home care resources emailed for additional support  Follow Up Plan:   Patient has met all care management goals. Care Management case will be closed. Patient has been provided contact information should new needs  arise.    Lenn Mean, LCSW [place]  [facility], [facility] Licensed Clinical Social Worker  Direct Dial: [phone]

## 2024-02-06 ENCOUNTER — Telehealth: Payer: Self-pay

## 2024-02-06 ENCOUNTER — Other Ambulatory Visit: Payer: Self-pay | Admitting: Internal Medicine

## 2024-02-06 DIAGNOSIS — Z1231 Encounter for screening mammogram for malignant neoplasm of breast: Secondary | ICD-10-CM

## 2024-02-06 DIAGNOSIS — D500 Iron deficiency anemia secondary to blood loss (chronic): Secondary | ICD-10-CM

## 2024-02-06 NOTE — Telephone Encounter (Signed)
-----   Message from Nurse Corning B sent at 12/19/2023  4:33 PM EDT ----- Regarding: 6-8 week lab reminder CBC, IBC + Ferritin - need to order Dr. Suzann

## 2024-02-06 NOTE — Telephone Encounter (Signed)
Lab orders in epic. MyChart message sent to patient with lab reminder.

## 2024-02-07 ENCOUNTER — Other Ambulatory Visit

## 2024-02-07 ENCOUNTER — Ambulatory Visit: Payer: Self-pay | Admitting: Pediatrics

## 2024-02-07 DIAGNOSIS — D500 Iron deficiency anemia secondary to blood loss (chronic): Secondary | ICD-10-CM

## 2024-02-07 LAB — CBC WITH DIFFERENTIAL/PLATELET
Basophils Absolute: 0 K/uL (ref 0.0–0.1)
Basophils Relative: 0.4 % (ref 0.0–3.0)
Eosinophils Absolute: 0.2 K/uL (ref 0.0–0.7)
Eosinophils Relative: 2 % (ref 0.0–5.0)
HCT: 40.2 % (ref 36.0–46.0)
Hemoglobin: 13.1 g/dL (ref 12.0–15.0)
Lymphocytes Relative: 38.8 % (ref 12.0–46.0)
Lymphs Abs: 3.8 K/uL (ref 0.7–4.0)
MCHC: 32.4 g/dL (ref 30.0–36.0)
MCV: 86.1 fl (ref 78.0–100.0)
Monocytes Absolute: 1 K/uL (ref 0.1–1.0)
Monocytes Relative: 10.4 % (ref 3.0–12.0)
Neutro Abs: 4.8 K/uL (ref 1.4–7.7)
Neutrophils Relative %: 48.4 % (ref 43.0–77.0)
Platelets: 227 K/uL (ref 150.0–400.0)
RBC: 4.67 Mil/uL (ref 3.87–5.11)
RDW: 18.3 % — ABNORMAL HIGH (ref 11.5–15.5)
WBC: 9.8 K/uL (ref 4.0–10.5)

## 2024-02-07 LAB — IBC + FERRITIN
Ferritin: 18.4 ng/mL (ref 10.0–291.0)
Iron: 115 ug/dL (ref 42–145)
Saturation Ratios: 31.1 % (ref 20.0–50.0)
TIBC: 369.6 ug/dL (ref 250.0–450.0)
Transferrin: 264 mg/dL (ref 212.0–360.0)

## 2024-02-08 ENCOUNTER — Ambulatory Visit
Admission: RE | Admit: 2024-02-08 | Discharge: 2024-02-08 | Disposition: A | Discharge status: Home / Self Care | Source: Ambulatory Visit | Attending: Internal Medicine | Admitting: Internal Medicine

## 2024-02-08 DIAGNOSIS — Z1231 Encounter for screening mammogram for malignant neoplasm of breast: Secondary | ICD-10-CM

## 2024-02-11 DIAGNOSIS — M1612 Unilateral primary osteoarthritis, left hip: Secondary | ICD-10-CM | POA: Diagnosis not present

## 2024-02-13 DIAGNOSIS — M1612 Unilateral primary osteoarthritis, left hip: Secondary | ICD-10-CM | POA: Diagnosis not present

## 2024-03-05 ENCOUNTER — Telehealth: Payer: Self-pay

## 2024-03-05 NOTE — Telephone Encounter (Signed)
 Copied from CRM [id number]. Topic: Clinical - Lab/Test Results >> Mar 04, 2024  4:06 PM Jasmin G wrote: Reason for CRM: Pt requested a call back from Mrs.Susan Norris regarding her recent CAD diagnosis, she states that she has never been diagnosed with it and so she has a couple of questions. Please call her back at [phone].

## 2024-03-05 NOTE — Telephone Encounter (Signed)
 Copied from CRM #[id number]. Topic: Clinical - Medical Advice >> Mar 05, 2024  3:26 PM Deaijah H wrote: Reason for CRM: Patient called in requesting to speak with Arnulfo per CAL currently with patient. Patient stated she really needs to speak with her. Please call [phone]

## 2024-03-06 NOTE — Telephone Encounter (Signed)
 For coronary artery calcium  score is what indicates heart disease.  She has some plaque-minimal plaque in her heart arteries which indicates coronary artery disease.  It is very mild which is why her score is low, but it is still there.

## 2024-04-05 ENCOUNTER — Other Ambulatory Visit: Payer: Self-pay | Admitting: Internal Medicine

## 2024-05-17 ENCOUNTER — Other Ambulatory Visit: Payer: Self-pay | Admitting: Internal Medicine

## 2024-05-19 DIAGNOSIS — M1612 Unilateral primary osteoarthritis, left hip: Secondary | ICD-10-CM | POA: Diagnosis not present

## 2024-05-19 DIAGNOSIS — M1711 Unilateral primary osteoarthritis, right knee: Secondary | ICD-10-CM | POA: Diagnosis not present

## 2024-06-08 ENCOUNTER — Encounter: Payer: Self-pay | Admitting: Internal Medicine

## 2024-06-08 DIAGNOSIS — Z01818 Encounter for other preprocedural examination: Secondary | ICD-10-CM | POA: Insufficient documentation

## 2024-06-08 NOTE — Assessment & Plan Note (Signed)
 Here for pre-op for L THR Chronic medical problems stable Hypertension controlled, sugars controlled No concerning symptoms suggestive of coronary ischemia  Labs today - cbc, cmp, a1c Reviewed EKG from earlier this year  Low risk for low risk surgery - cleared for surgery.

## 2024-06-08 NOTE — Patient Instructions (Addendum)
      Blood work was ordered.       Medications changes include :   None    A referral was ordered and someone will call you to schedule an appointment.     Return in about 6 months (around 12/07/2024) for Physical Exam.

## 2024-06-08 NOTE — Progress Notes (Unsigned)
 Subjective:    Patient ID: Susan Norris, female    DOB: 1951-06-29, 73 y.o.   MRN: [id number]      HPI Tangi is here for pre-operative clearance at the request of Dr josefina for left total hip arthroplasty scheduled for XXX.   Dorothy denies any personal or family history of problems with anesthesia or bleeding/blood clot problems.    Chanah has no concerns and is taking all prescribed medication as prescribed.   Chaney is not exercising regularly.  With their daily activities they denies chest pain, palpitations, SOB and lightheadedness.       Medications and allergies reviewed with patient and updated if appropriate.  Current Outpatient Medications on File Prior to Visit  Medication Sig Dispense Refill   albuterol  (VENTOLIN  HFA) 108 (90 Base) MCG/ACT inhaler Inhale 2 puffs into the lungs every 2 (two) hours as needed for wheezing or shortness of breath (cough). 8 g 0   ascorbic acid (VITAMIN C) 500 MG tablet Take 1,000 mg by mouth daily.     [Paused] aspirin 81 MG EC tablet Take 81 mg by mouth daily. Swallow whole.     azithromycin  (ZITHROMAX ) 250 MG tablet Take two tabs the first day and then one tab daily for four days (Patient not taking: Reported on 01/22/2024) 6 tablet 0   benzonatate  (TESSALON ) 200 MG capsule Take 1 capsule (200 mg total) by mouth 3 (three) times daily as needed for cough. 30 capsule 0   Calcium  Carbonate-Vit D-Min (CALCIUM  1200 PO) Take 1,200 mg by mouth daily.     cetirizine (ZYRTEC) 10 MG tablet Take 10 mg by mouth daily.     Cholecalciferol (VITAMIN D3 PO) Take 250 mcg by mouth daily.  5   clonazePAM  (KLONOPIN ) 0.5 MG tablet Take 1 tablet (0.5 mg total) by mouth 2 (two) times daily as needed for anxiety. 20 tablet 1   Coenzyme Q10 (COQ-10) 100 MG CAPS Take 100 mg by mouth daily.     Cranberry-Vitamin C (AZO CRANBERRY URINARY TRACT PO) Take 1 tablet by mouth daily.     Cyanocobalamin (B-12 PO) Take 500 mcg by mouth daily.     escitalopram  (LEXAPRO ) 20 MG tablet  TAKE 1 TABLET BY MOUTH EVERY DAY 90 tablet 1   estradiol  (ESTRACE ) 1 MG tablet TAKE 1 AND 1/2 TABLETS DAILY BY MOUTH 135 tablet 2   fluticasone  (FLONASE ) 50 MCG/ACT nasal spray Place 2 sprays into both nostrils daily. (Patient taking differently: Place 2 sprays into both nostrils daily as needed for allergies.) 9.9 g 0   meclizine  (ANTIVERT ) 25 MG tablet Take 1 tablet (25 mg total) by mouth 3 (three) times daily as needed for dizziness. 30 tablet 2   Omega-3 Fatty Acids (FISH OIL PO) Take 1,200 mg by mouth daily.     omeprazole  (PRILOSEC) 40 MG capsule TAKE 1 CAPSULE (40 MG TOTAL) BY MOUTH DAILY. 90 capsule 2   rosuvastatin  (CRESTOR ) 10 MG tablet TAKE 1 TABLET BY MOUTH EVERY DAY (Patient taking differently: Take 10 mg by mouth at bedtime.) 90 tablet 2   Turmeric (QC TUMERIC COMPLEX PO) Take 2,400 mg by mouth daily.     vitamin E 180 MG (400 UNITS) capsule Take 400 Units by mouth daily.     zinc gluconate 50 MG tablet Take 50 mg by mouth daily.     No current facility-administered medications on file prior to visit.    Review of Systems     Objective:  There were no  vitals filed for this visit. There were no vitals filed for this visit. There is no height or weight on file to calculate BMI.  BP Readings from Last 3 Encounters:  12/25/23 (!) 130/90  12/19/23 (!) 140/68  11/16/23 (!) 133/47    Wt Readings from Last 3 Encounters:  12/25/23 181 lb (82.1 kg)  12/19/23 184 lb (83.5 kg)  11/21/23 185 lb (83.9 kg)       Physical Exam Constitutional: She appears well-developed and well-nourished. No distress.  HENT:  Head: Normocephalic and atraumatic.  Right Ear: External ear normal. Normal ear canal and TM Left Ear: External ear normal.  Normal ear canal and TM Mouth/Throat: Oropharynx is clear and moist.  Eyes: Conjunctivae normal.  Neck: Neck supple. No tracheal deviation present. No thyromegaly present.  No carotid bruit  Cardiovascular: Normal rate, regular rhythm and normal  heart sounds.   No murmur heard.  No edema. Pulmonary/Chest: Effort normal and breath sounds normal. No respiratory distress. She has no wheezes. She has no rales.  Breast: deferred   Abdominal: Soft. She exhibits no distension. There is no tenderness.  Lymphadenopathy: She has no cervical adenopathy.  Skin: Skin is warm and dry. She is not diaphoretic.  Psychiatric: She has a normal mood and affect. Her behavior is normal.     Lab Results  Component Value Date   WBC 9.8 02/07/2024   HGB 13.1 02/07/2024   HCT 40.2 02/07/2024   PLT 227.0 02/07/2024   GLUCOSE 88 11/15/2023   CHOL 142 10/08/2023   TRIG 130.0 10/08/2023   HDL 65.90 10/08/2023   LDLCALC 50 10/08/2023   ALT 8 11/13/2023   AST 17 11/13/2023   NA 136 11/15/2023   K 3.7 11/15/2023   CL 103 11/15/2023   CREATININE 0.55 11/15/2023   BUN 9 11/15/2023   CO2 25 11/15/2023   TSH 1.31 10/08/2023   HGBA1C 6.1 10/08/2023         Assessment & Plan:      See Problem List for Assessment and Plan of chronic medical problems.

## 2024-06-09 ENCOUNTER — Ambulatory Visit (INDEPENDENT_AMBULATORY_CARE_PROVIDER_SITE_OTHER): Admitting: Internal Medicine

## 2024-06-09 VITALS — BP 122/74 | HR 64 | Temp 97.6°F | Ht 64.0 in | Wt 177.4 lb

## 2024-06-09 DIAGNOSIS — I2510 Atherosclerotic heart disease of native coronary artery without angina pectoris: Secondary | ICD-10-CM

## 2024-06-09 DIAGNOSIS — F32A Depression, unspecified: Secondary | ICD-10-CM | POA: Diagnosis not present

## 2024-06-09 DIAGNOSIS — F419 Anxiety disorder, unspecified: Secondary | ICD-10-CM | POA: Diagnosis not present

## 2024-06-09 DIAGNOSIS — R7303 Prediabetes: Secondary | ICD-10-CM

## 2024-06-09 DIAGNOSIS — Z636 Dependent relative needing care at home: Secondary | ICD-10-CM | POA: Diagnosis not present

## 2024-06-09 DIAGNOSIS — Z862 Personal history of diseases of the blood and blood-forming organs and certain disorders involving the immune mechanism: Secondary | ICD-10-CM | POA: Diagnosis not present

## 2024-06-09 DIAGNOSIS — K219 Gastro-esophageal reflux disease without esophagitis: Secondary | ICD-10-CM

## 2024-06-09 DIAGNOSIS — J4520 Mild intermittent asthma, uncomplicated: Secondary | ICD-10-CM | POA: Diagnosis not present

## 2024-06-09 DIAGNOSIS — Z01818 Encounter for other preprocedural examination: Secondary | ICD-10-CM

## 2024-06-09 DIAGNOSIS — E7800 Pure hypercholesterolemia, unspecified: Secondary | ICD-10-CM

## 2024-06-09 LAB — COMPREHENSIVE METABOLIC PANEL WITH GFR
ALT: 15 U/L (ref 0–35)
AST: 17 U/L (ref 0–37)
Albumin: 3.9 g/dL (ref 3.5–5.2)
Alkaline Phosphatase: 49 U/L (ref 39–117)
BUN: 19 mg/dL (ref 6–23)
CO2: 22 meq/L (ref 19–32)
Calcium: 9.1 mg/dL (ref 8.4–10.5)
Chloride: 103 meq/L (ref 96–112)
Creatinine, Ser: 0.67 mg/dL (ref 0.40–1.20)
GFR: 87.83 mL/min (ref >60.00)
Glucose, Bld: 96 mg/dL (ref 70–99)
Potassium: 3.9 meq/L (ref 3.5–5.1)
Sodium: 135 meq/L (ref 135–145)
Total Bilirubin: 0.4 mg/dL (ref 0.2–1.2)
Total Protein: 6.6 g/dL (ref 6.0–8.3)

## 2024-06-09 LAB — LIPID PANEL
Cholesterol: 131 mg/dL (ref 0–200)
HDL: 56.3 mg/dL (ref >39.00)
LDL Cholesterol: 59 mg/dL (ref 0–99)
NonHDL: 75.07
Total CHOL/HDL Ratio: 2
Triglycerides: 79 mg/dL (ref 0.0–149.0)
VLDL: 15.8 mg/dL (ref 0.0–40.0)

## 2024-06-09 LAB — CBC WITH DIFFERENTIAL/PLATELET
Basophils Absolute: 0.1 K/uL (ref 0.0–0.1)
Basophils Relative: 0.6 % (ref 0.0–3.0)
Eosinophils Absolute: 0.1 K/uL (ref 0.0–0.7)
Eosinophils Relative: 1.2 % (ref 0.0–5.0)
HCT: 39.8 % (ref 36.0–46.0)
Hemoglobin: 13.3 g/dL (ref 12.0–15.0)
Lymphocytes Relative: 27 % (ref 12.0–46.0)
Lymphs Abs: 2.8 K/uL (ref 0.7–4.0)
MCHC: 33.4 g/dL (ref 30.0–36.0)
MCV: 93.5 fl (ref 78.0–100.0)
Monocytes Absolute: 0.9 K/uL (ref 0.1–1.0)
Monocytes Relative: 8.3 % (ref 3.0–12.0)
Neutro Abs: 6.6 K/uL (ref 1.4–7.7)
Neutrophils Relative %: 62.9 % (ref 43.0–77.0)
Platelets: 225 K/uL (ref 150.0–400.0)
RBC: 4.26 Mil/uL (ref 3.87–5.11)
RDW: 13.7 % (ref 11.5–15.5)
WBC: 10.5 K/uL (ref 4.0–10.5)

## 2024-06-09 LAB — IBC PANEL
Iron: 118 ug/dL (ref 42–145)
Saturation Ratios: 34.8 % (ref 20.0–50.0)
TIBC: 338.8 ug/dL (ref 250.0–450.0)
Transferrin: 242 mg/dL (ref 212.0–360.0)

## 2024-06-09 LAB — FERRITIN: Ferritin: 29 ng/mL (ref 10.0–291.0)

## 2024-06-09 MED ORDER — ESTRADIOL 1 MG PO TABS: 1.0000 mg | ORAL_TABLET | ORAL | Status: AC

## 2024-06-09 NOTE — Assessment & Plan Note (Signed)
 Chronic, intermittent Uses albuterol  on occasion Takes allergy medication daily

## 2024-06-09 NOTE — Assessment & Plan Note (Signed)
 History of anemia, earlier this year secondary to upper GI bleed CBC, iron panel

## 2024-06-09 NOTE — Assessment & Plan Note (Addendum)
 Chronic No symptoms consistent with angina denies any chest pain, shortness of breath or palpitations Continue aspirin 81 mg daily, rosuvastatin  10 mg daily Checks CMP, CBC, lipid panel EKG done earlier this year-reviewed-no need to repeat today

## 2024-06-09 NOTE — Assessment & Plan Note (Addendum)
 Chronic History of upper GI bleed GERD controlled Continue omeprazole  40 mg daily

## 2024-06-09 NOTE — Assessment & Plan Note (Addendum)
 Chronic Improved Currently controlled Continue Lexapro  20 mg daily, clonazepam  0.5 mg twice daily

## 2024-06-09 NOTE — Assessment & Plan Note (Signed)
 Chronic Lab Results  Component Value Date   LDLCALC 50 10/08/2023   Regular exercise and healthy diet encouraged Check lipid panel, cmp Continue Crestor  10 mg daily

## 2024-06-09 NOTE — Assessment & Plan Note (Addendum)
 Chronic Mostly related to caregiver stress-her husband is now residing at [facility] which has been good for him and her Controlled Continue Lexapro  20 mg daily, clonazepam  0.5 mg twice daily as needed She is sleeping well

## 2024-06-09 NOTE — Assessment & Plan Note (Signed)
 Chronic Lab Results  Component Value Date   HGBA1C 6.1 10/08/2023   Check a1c Low sugar / carb diet Encouraged regular exercise

## 2024-06-10 ENCOUNTER — Ambulatory Visit: Payer: Self-pay | Admitting: Internal Medicine

## 2024-06-10 LAB — HEMOGLOBIN A1C: Hgb A1c MFr Bld: 5.6 % (ref 4.6–6.5)

## 2024-06-12 ENCOUNTER — Telehealth: Payer: Self-pay

## 2024-06-12 NOTE — Telephone Encounter (Signed)
 Copied from CRM [id number]. Topic: General - Other >> Jun 12, 2024  2:45 PM Lauren C wrote: Reason for CRM: Patient is calling to request to speak with Tobias to see if she has faxed over surgery clearance to ortho doctor. Please have Tobias return call at [phone]. She says she is needing call before end of day and that it is urgent so she can get her surgery.

## 2024-06-12 NOTE — Telephone Encounter (Signed)
 Left message for patient today that clearance was refaxed again today.  First one was faxed on 11/18//25.

## 2024-07-21 ENCOUNTER — Telehealth

## 2024-07-21 DIAGNOSIS — Z20828 Contact with and (suspected) exposure to other viral communicable diseases: Secondary | ICD-10-CM | POA: Diagnosis not present

## 2024-07-21 DIAGNOSIS — R6889 Other general symptoms and signs: Secondary | ICD-10-CM | POA: Diagnosis not present

## 2024-07-22 MED ORDER — BENZONATATE 100 MG PO CAPS: 100.0000 mg | ORAL_CAPSULE | Freq: Three times a day (TID) | ORAL | 0 refills | Status: AC | PRN

## 2024-07-22 MED ORDER — OSELTAMIVIR PHOSPHATE 75 MG PO CAPS: 75.0000 mg | ORAL_CAPSULE | Freq: Two times a day (BID) | ORAL | 0 refills | Status: AC

## 2024-07-22 NOTE — Progress Notes (Signed)
 Message sent to patient requesting further input regarding current symptoms. Awaiting patient response.

## 2024-07-22 NOTE — Progress Notes (Signed)
 E visit for Flu like symptoms   We are sorry that you are not feeling well.  Here is how we plan to help! Based on what you have shared with me it looks like you may have a respiratory virus that may be influenza.  Influenza or "the flu" is  an infection caused by a respiratory virus. The flu virus is highly contagious and persons who did not receive their yearly flu vaccination may "catch" the flu from close contact.  We have anti-viral medications to treat the viruses that cause this infection. They are not a "cure" and only shorten the course of the infection. These prescriptions are most effective when they are given within the first 2 days of "flu" symptoms. Antiviral medications are indicated if you have a high risk of complications from the flu. You should  also consider an antiviral medication if you are in close contact with someone who is at risk. These medications can help patients avoid complications from the flu but have side effects that you should know.   Possible side effects from Tamiflu or oseltamivir include nausea, vomiting, diarrhea, dizziness, headaches, eye redness, sleep problems or other respiratory symptoms. You should not take Tamiflu if you have an allergy to oseltamivir or any to the ingredients in Tamiflu.  Based upon your symptoms and potential risk factors I have prescribed Oseltamivir (Tamiflu).  It has been sent to your designated pharmacy.  You will take one 75 mg capsule orally twice a day for the next 5 days.   For nasal congestion, you may use an oral decongestant such as Mucinex  D or if you have glaucoma or high blood pressure use plain Mucinex .  Saline nasal spray or nasal drops can help and can safely be used as often as needed for congestion.  If you have a sore or scratchy throat, use a saltwater gargle-  to  teaspoon of salt dissolved in a 4-ounce to 8-ounce glass of warm water.  Gargle the solution for approximately 15-30 seconds and then spit.  It is  important not to swallow the solution.  You can also use throat lozenges/cough drops and Chloraseptic spray to help with throat pain or discomfort.  Warm or cold liquids can also be helpful in relieving throat pain.  For headache, pain or general discomfort, you can use Ibuprofen  or Tylenol  as directed.   Some authorities believe that zinc sprays or the use of Echinacea may shorten the course of your symptoms.  I have prescribed the following medications to help lessen symptoms: I have prescribed Tessalon  Perles 100 mg. You may take 1-2 capsules every 8 hours as needed for cough  You are to isolate at home until you have been fever-free for at least 24 hours without a fever-reducing medication, and symptoms have been steadily improving for 24 hours.  If you must be around other household members who do not have symptoms, you need to make sure that both you and the family members are masking consistently with a high-quality mask.  If you note any worsening of symptoms despite treatment, please seek an in-person evaluation ASAP. If you note any significant shortness of breath or any chest pain, please seek ED evaluation. Please do not delay care!  ANYONE WHO HAS FLU SYMPTOMS SHOULD: Stay home. The flu is highly contagious and going out or to work exposes others! Be sure to drink plenty of fluids. Water is fine as well as fruit juices, sodas and electrolyte beverages. You may want to stay  away from caffeine or alcohol. If you are nauseated, try taking small sips of liquids. How do you know if you are getting enough fluid? Your urine should be a pale yellow or almost colorless. Get rest. Taking a steamy shower or using a humidifier may help nasal congestion and ease sore throat pain. Using a saline nasal spray works much the same way. Cough drops, hard candies and sore throat lozenges may ease your cough. Line up a caregiver. Have someone check on you regularly.  GET HELP RIGHT AWAY IF: You cannot  keep down liquids or your medications. You become short of breath Your fell like you are going to pass out or loose consciousness. Your symptoms persist after you have completed your treatment plan  MAKE SURE YOU  Understand these instructions. Will watch your condition. Will get help right away if you are not doing well or get worse.  Your e-visit answers were reviewed by a board certified advanced clinical practitioner to complete your personal care plan.  Depending on the condition, your plan could have included both over the counter or prescription medications.  If there is a problem please reply  once you have received a response from your provider.  Your safety is important to us .  If you have drug allergies check your prescription carefully.    You can use MyChart to ask questions about today's visit, request a non-urgent call back, or ask for a work or school excuse for 24 hours related to this e-Visit. If it has been greater than 24 hours you will need to follow up with your provider, or enter a new e-Visit to address those concerns.  You will get an e-mail in the next two days asking about your experience.  I hope that your e-visit has been valuable and will speed your recovery. Thank you for using e-visits.   I have spent 5 minutes in review of e-visit questionnaire, review and updating patient chart, medical decision making and response to patient.   Elsie Velma Lunger, PA-C

## 2024-07-26 ENCOUNTER — Other Ambulatory Visit: Payer: Self-pay | Admitting: Internal Medicine

## 2024-07-26 DIAGNOSIS — K219 Gastro-esophageal reflux disease without esophagitis: Secondary | ICD-10-CM

## 2024-11-21 ENCOUNTER — Ambulatory Visit
